# Patient Record
Sex: Female | Born: 1990
Health system: Southern US, Community
[De-identification: ages and names within clinical notes are randomized; demographics above are authoritative.]

## PROBLEM LIST (undated history)

## (undated) ENCOUNTER — Inpatient Hospital Stay (HOSPITAL_COMMUNITY): Payer: Self-pay

## (undated) DIAGNOSIS — A549 Gonococcal infection, unspecified: Secondary | ICD-10-CM

## (undated) DIAGNOSIS — B54 Unspecified malaria: Secondary | ICD-10-CM

## (undated) DIAGNOSIS — A749 Chlamydial infection, unspecified: Secondary | ICD-10-CM

## (undated) DIAGNOSIS — D332 Benign neoplasm of brain, unspecified: Secondary | ICD-10-CM

## (undated) HISTORY — PX: NO PAST SURGERIES: SHX2092

---
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
## 2011-09-27 DIAGNOSIS — D132 Benign neoplasm of duodenum: Secondary | ICD-10-CM | POA: Insufficient documentation

## 2011-09-27 HISTORY — DX: Benign neoplasm of duodenum: D13.2

## 2012-02-20 DIAGNOSIS — D126 Benign neoplasm of colon, unspecified: Secondary | ICD-10-CM | POA: Insufficient documentation

## 2012-02-20 HISTORY — DX: Benign neoplasm of colon, unspecified: D12.6

## 2012-05-13 ENCOUNTER — Emergency Department (HOSPITAL_COMMUNITY)
Admission: EM | Admit: 2012-05-13 | Discharge: 2012-05-13 | Disposition: A | Discharge status: Home Health | Payer: Medicaid Other | Attending: Emergency Medicine | Admitting: Emergency Medicine

## 2012-05-13 ENCOUNTER — Encounter (HOSPITAL_COMMUNITY): Payer: Self-pay | Admitting: *Deleted

## 2012-05-13 ENCOUNTER — Emergency Department (HOSPITAL_COMMUNITY): Payer: Medicaid Other

## 2012-05-13 DIAGNOSIS — N760 Acute vaginitis: Secondary | ICD-10-CM | POA: Insufficient documentation

## 2012-05-13 DIAGNOSIS — B9689 Other specified bacterial agents as the cause of diseases classified elsewhere: Secondary | ICD-10-CM | POA: Insufficient documentation

## 2012-05-13 DIAGNOSIS — M549 Dorsalgia, unspecified: Secondary | ICD-10-CM | POA: Insufficient documentation

## 2012-05-13 DIAGNOSIS — A499 Bacterial infection, unspecified: Secondary | ICD-10-CM | POA: Insufficient documentation

## 2012-05-13 DIAGNOSIS — O2390 Unspecified genitourinary tract infection in pregnancy, unspecified trimester: Secondary | ICD-10-CM | POA: Insufficient documentation

## 2012-05-13 DIAGNOSIS — Z3490 Encounter for supervision of normal pregnancy, unspecified, unspecified trimester: Secondary | ICD-10-CM

## 2012-05-13 LAB — URINALYSIS, ROUTINE W REFLEX MICROSCOPIC
Ketones, ur: NEGATIVE mg/dL
Leukocytes, UA: NEGATIVE
Nitrite: NEGATIVE
Protein, ur: NEGATIVE mg/dL
pH: 7.5 (ref 5.0–8.0)

## 2012-05-13 MED ORDER — CEFTRIAXONE SODIUM 250 MG IJ SOLR
250.0000 mg | Freq: Once | INTRAMUSCULAR | Status: AC
  Administered 2012-05-13: 250 mg via INTRAMUSCULAR
  Filled 2012-05-13: qty 250

## 2012-05-13 MED ORDER — METRONIDAZOLE 500 MG PO TABS: 500.0000 mg | ORAL_TABLET | Freq: Two times a day (BID) | ORAL | Status: AC

## 2012-05-13 MED ORDER — FOLIC ACID 1 MG PO TABS: 1.0000 mg | ORAL_TABLET | Freq: Every day | ORAL | Status: DC

## 2012-05-13 MED ORDER — THERA VITAL M PO TABS: 1.0000 | ORAL_TABLET | Freq: Every day | ORAL | Status: DC

## 2012-05-13 MED ORDER — AZITHROMYCIN 1 G PO PACK
1.0000 g | PACK | Freq: Once | ORAL | Status: AC
  Administered 2012-05-13: 1 g via ORAL
  Filled 2012-05-13: qty 1

## 2012-05-13 NOTE — ED Notes (Signed)
To ED for eval of lower back pain and lower abd pain for the past 2 wks. States she is 2 wks overdue for her menses.

## 2012-05-13 NOTE — ED Provider Notes (Signed)
History     CSN: [id number]  Arrival date & time 05/13/12  1354   First MD Initiated Contact with Patient 05/13/12 1646      Chief Complaint  Patient presents with  . Back Pain  . Abdominal Pain    (Consider location/radiation/quality/duration/timing/severity/associated sxs/prior treatment) HPI Comments: 21 y/o g0p0 comes in with cc of abd pain and back pain. Pt has 1 week of suprapubic abd pain, intermitent with no precipitating, aggravating or relieving factors. Pt has associated clear vaginal discharge, with no hx of STD in the past. Pt has no associated n/v/f/c/uti like sx. Pt does have lower back pain with the abd pain, but no trauma.   Patient is a 21 y.o. female presenting with back pain and abdominal pain. The history is provided by the patient.  Back Pain  Associated symptoms include abdominal pain. Pertinent negatives include no chest pain, no headaches and no dysuria.  Abdominal Pain The primary symptoms of the illness include abdominal pain. The primary symptoms of the illness do not include shortness of breath, nausea, vomiting or dysuria.  Additional symptoms associated with the illness include back pain.    History reviewed. No pertinent past medical history.  History reviewed. No pertinent past surgical history.  History reviewed. No pertinent family history.  History  Substance Use Topics  . Smoking status: Never Smoker   . Smokeless tobacco: Not on file  . Alcohol Use: Yes    OB History    Grav Para Term Preterm Abortions TAB SAB Ect Mult Living                  Review of Systems  Constitutional: Positive for activity change.  HENT: Negative for neck pain.   Respiratory: Negative for shortness of breath.   Cardiovascular: Negative for chest pain.  Gastrointestinal: Positive for abdominal pain. Negative for nausea and vomiting.  Genitourinary: Negative for dysuria.  Musculoskeletal: Positive for back pain.  Neurological: Negative for headaches.      Allergies  Review of patient's allergies indicates no known allergies.  Home Medications   Current Outpatient Rx  Name Route Sig Dispense Refill  . CABERGOLINE 0.5 MG PO TABS Oral Take 0.25 mg by mouth 2 (two) times a week.      BP 124/67  Pulse 75  Temp 98.6 F (37 C) (Oral)  Resp 16  SpO2 100%  LMP 03/30/2012  Physical Exam  Constitutional: She is oriented to person, place, and time. She appears well-developed and well-nourished.  HENT:  Head: Normocephalic and atraumatic.  Eyes: Conjunctivae and EOM are normal. Pupils are equal, round, and reactive to light.  Neck: Normal range of motion. Neck supple.  Cardiovascular: Normal rate, regular rhythm, normal heart sounds and intact distal pulses.   No murmur heard. Pulmonary/Chest: Effort normal. No respiratory distress. She has no wheezes.  Abdominal: Soft. Bowel sounds are normal. She exhibits no distension. There is no tenderness. There is no rebound and no guarding.  Genitourinary: Vagina normal and uterus normal.       External exam - normal, no lesions Speculum exam: Pt has some white discharge, no blood Bimanual exam: Patient has no CMT, no adnexal tenderness or fullness and cervical os is closed  Neurological: She is alert and oriented to person, place, and time.  Skin: Skin is warm and dry.    ED Course  Procedures (including critical care time)  Labs Reviewed  POCT PREGNANCY, URINE - Abnormal; Notable for the following:    Preg  Test, Ur POSITIVE (*)     All other components within normal limits  URINALYSIS, ROUTINE W REFLEX MICROSCOPIC  HCG, QUANTITATIVE, PREGNANCY  WET PREP, GENITAL   No results found.   No diagnosis found.    MDM  g0p0 patient comes in with cc of suprapubic pain and back pain, intermittent x 1 week. Abd exam is non peritoneal Upreg is +. We will get a pelvic exam and evaluate for STD as she has vaginal discharge.        Derwood Kaplan, MD 05/14/12 [id number]

## 2012-05-14 LAB — GC/CHLAMYDIA PROBE AMP, GENITAL
Chlamydia, DNA Probe: NEGATIVE
GC Probe Amp, Genital: NEGATIVE

## 2012-07-21 ENCOUNTER — Encounter (HOSPITAL_COMMUNITY): Payer: Self-pay | Admitting: *Deleted

## 2012-07-21 ENCOUNTER — Inpatient Hospital Stay (HOSPITAL_COMMUNITY)
Admission: AD | Admit: 2012-07-21 | Discharge: 2012-07-21 | Disposition: A | Discharge status: Home / Self Care | Payer: Medicaid Other | Source: Ambulatory Visit | Attending: Obstetrics & Gynecology | Admitting: Obstetrics & Gynecology

## 2012-07-21 DIAGNOSIS — Z3490 Encounter for supervision of normal pregnancy, unspecified, unspecified trimester: Secondary | ICD-10-CM

## 2012-07-21 DIAGNOSIS — O0930 Supervision of pregnancy with insufficient antenatal care, unspecified trimester: Secondary | ICD-10-CM

## 2012-07-21 DIAGNOSIS — O99891 Other specified diseases and conditions complicating pregnancy: Secondary | ICD-10-CM | POA: Insufficient documentation

## 2012-07-21 DIAGNOSIS — Z3480 Encounter for supervision of other normal pregnancy, unspecified trimester: Secondary | ICD-10-CM

## 2012-07-21 LAB — DIFFERENTIAL
Basophils Absolute: 0 10*3/uL (ref 0.0–0.1)
Basophils Relative: 0 % (ref 0–1)
Monocytes Absolute: 0.5 10*3/uL (ref 0.1–1.0)
Neutro Abs: 4.5 10*3/uL (ref 1.7–7.7)
Neutrophils Relative %: 71 % (ref 43–77)

## 2012-07-21 LAB — CBC
MCHC: 35 g/dL (ref 30.0–36.0)
RDW: 12.1 % (ref 11.5–15.5)

## 2012-07-21 LAB — RPR: RPR Ser Ql: NONREACTIVE

## 2012-07-21 NOTE — MAU Note (Signed)
Unsure of period dates and [place]; needs verification letter for medicaid insurance; FHR dopplered at [place] @ [facility]; pt had originally thought she was close to [redacted] weeks gestation;

## 2012-07-21 NOTE — MAU Note (Signed)
I'm pregnant, hasn't had any care.  Had preg confirmed at [facility] in July. Waiting on medicaid.  No pain, no bleeding.

## 2012-07-21 NOTE — MAU Note (Signed)
[place] end of July at [facility]- [redacted]w[redacted]d, date adjusted.

## 2012-07-21 NOTE — MAU Provider Note (Signed)
History     CSN: [id number]  Arrival date and time: 07/21/12 1757   None     No chief complaint on file.  HPI Pt is a 21 y.o. G1P0 here for prenatal visit. She found out she was pregnant in July after missed period. Positive pregnancy test at [facility] ED. Sono showed gestational sac and early pregnancy but not fetal pole. She has not been able to get Medicaid and thus has delayed her prenatal care. Her LMP was around 6/18, giving her an EDD or 01/11/13 which roughly agrees with the 5 week sono done at [facility]. She states her periods were regular. She was not on birth control but this is not a planned pregnancy.  She denies bleeding, cramping, headache, vision changes, dysuria, vaginal discharge.   OB History    Grav Para Term Preterm Abortions TAB SAB Ect Mult Living   1               Past Medical History  Diagnosis Date  . No pertinent past medical history     Past Surgical History  Procedure Date  . No past surgeries     Family History  Problem Relation Age of Onset  . Other Neg Hx   PGF diabetes, Maternal aunt (colon 31s)  History  Substance Use Topics  . Smoking status: Never Smoker   . Smokeless tobacco: Not on file  . Alcohol Use: No    Allergies: No Known Allergies  Prescriptions prior to admission  Medication Sig Dispense Refill  . Prenatal Vit-Fe Fumarate-FA (PRENATAL MULTIVITAMIN) TABS Take 1 tablet by mouth every morning.        Review of Systems  Constitutional: Negative for fever and chills.  Eyes: Negative for blurred vision and double vision.  Respiratory: Negative for shortness of breath.   Cardiovascular: Negative for chest pain.  Gastrointestinal: Negative for nausea, vomiting and abdominal pain.  Genitourinary: Negative for dysuria, urgency and frequency.  Neurological: Positive for headaches. Negative for dizziness.   Physical Exam   Blood pressure 133/86, pulse 91, temperature 98.2 F (36.8 C), temperature source Oral, resp. rate 16, height  5\' 7"  (1.702 m), weight 119.75 kg (264 lb), last menstrual period 03/30/2012.  Physical Exam  Constitutional: She is oriented to person, place, and time. She appears well-developed and well-nourished. No distress.  HENT:  Head: Normocephalic and atraumatic.  Eyes: Conjunctivae normal and EOM are normal.  Neck: Normal range of motion. Neck supple.  Cardiovascular: Normal rate, regular rhythm and normal heart sounds.   Respiratory: Effort normal and breath sounds normal. No respiratory distress.  GI: Soft. There is no tenderness. There is no rebound and no guarding.       Gravid - fundus below umbilicus.  Musculoskeletal: Normal range of motion. She exhibits no edema and no tenderness.  Neurological: She is alert and oriented to person, place, and time.  Skin: Skin is warm and dry.  Psychiatric: She has a normal mood and affect.    MAU Course  Procedures  Bedside sono shows fetus with positive heart tones.  Results for orders placed during the hospital encounter of 07/21/12 (from the past 24 hour(s))  HEPATITIS B SURFACE ANTIGEN     Status: Normal   Collection Time   07/21/12  7:45 PM      Component Value Range   Hepatitis B Surface Ag NEGATIVE  NEGATIVE  RUBELLA SCREEN     Status: Abnormal   Collection Time   07/21/12  7:45 PM  Component Value Range   Rubella 74.3 (*)   RPR     Status: Normal   Collection Time   07/21/12  7:45 PM      Component Value Range   RPR NON REACTIVE  NON REACTIVE  CBC     Status: Abnormal   Collection Time   07/21/12  7:45 PM      Component Value Range   WBC 6.4  4.0 - 10.5 K/uL   RBC 4.01  3.87 - 5.11 MIL/uL   Hemoglobin 11.7 (*) 12.0 - 15.0 g/dL   HCT 40.9 (*) 81.1 - 91.4 %   MCV 83.3  78.0 - 100.0 fL   MCH 29.2  26.0 - 34.0 pg   MCHC 35.0  30.0 - 36.0 g/dL   RDW 78.2  95.6 - 21.3 %   Platelets 199  150 - 400 K/uL  DIFFERENTIAL     Status: Normal   Collection Time   07/21/12  7:45 PM      Component Value Range   Neutrophils Relative 71   43 - 77 %   Neutro Abs 4.5  1.7 - 7.7 K/uL   Lymphocytes Relative 21  12 - 46 %   Lymphs Abs 1.4  0.7 - 4.0 K/uL   Monocytes Relative 7  3 - 12 %   Monocytes Absolute 0.5  0.1 - 1.0 K/uL   Eosinophils Relative 1  0 - 5 %   Eosinophils Absolute 0.0  0.0 - 0.7 K/uL   Basophils Relative 0  0 - 1 %   Basophils Absolute 0.0  0.0 - 0.1 K/uL  ABO/RH     Status: Normal   Collection Time   07/21/12  7:45 PM      Component Value Range   ABO/RH(D) A POS    HIV ANTIBODY (ROUTINE TESTING)     Status: Normal   Collection Time   07/21/12  7:45 PM      Component Value Range   HIV NON REACTIVE  NON REACTIVE     Assessment and Plan  21 y.o. G1P0 at [redacted]w[redacted]d with no prenatal care. 1.  Prenatal labs drawn.  GC/Chlamydia done at [facility] in July negative. 2.  Outpatient sono ordered for 18 weeks for fetal survey. 3.  Pt referred for care at [facility] low risk clinic.   Napoleon Form 07/21/2012, 7:27 PM

## 2012-07-22 ENCOUNTER — Encounter: Payer: Self-pay | Admitting: Family Medicine

## 2012-07-22 LAB — RUBELLA SCREEN: Rubella: 74.3 IU/mL — ABNORMAL HIGH

## 2012-07-22 LAB — HIV ANTIBODY (ROUTINE TESTING W REFLEX): HIV: NONREACTIVE

## 2012-08-12 ENCOUNTER — Ambulatory Visit (INDEPENDENT_AMBULATORY_CARE_PROVIDER_SITE_OTHER): Payer: Medicaid Other | Admitting: Family Medicine

## 2012-08-12 VITALS — BP 119/71 | Temp 97.7°F | Wt 257.0 lb

## 2012-08-12 DIAGNOSIS — O0930 Supervision of pregnancy with insufficient antenatal care, unspecified trimester: Secondary | ICD-10-CM

## 2012-08-12 DIAGNOSIS — Z3400 Encounter for supervision of normal first pregnancy, unspecified trimester: Secondary | ICD-10-CM

## 2012-08-12 DIAGNOSIS — Z23 Encounter for immunization: Secondary | ICD-10-CM

## 2012-08-12 LAB — POCT URINALYSIS DIP (DEVICE)
Glucose, UA: NEGATIVE mg/dL
Hgb urine dipstick: NEGATIVE
Nitrite: NEGATIVE
Urobilinogen, UA: 0.2 mg/dL (ref 0.0–1.0)

## 2012-08-12 MED ORDER — TETANUS-DIPHTH-ACELL PERTUSSIS 5-2.5-18.5 LF-MCG/0.5 IM SUSP: 0.5000 mL | Freq: Once | INTRAMUSCULAR | Status: DC

## 2012-08-12 NOTE — Progress Notes (Signed)
Subjective:    Adaliz Leggitt is being seen today for her first obstetrical visit.  This is not a planned pregnancy. She is at [redacted]w[redacted]d gestation by 5 week ultrasound, unsure of LMP. She has not had previous prenatal care due to being unable to get Medicaid in time. Her obstetrical history is significant for obesity. Relationship with FOB: spouse, not living together. Patient does intend to breast feed. Pregnancy history fully reviewed. No bleeding, cramping. Feeling flutters. Had Pap smear 1 year ago - health dept [place]. Negative.   The patient states she has a history of some kind of tumor in her head. She thinks it might be a pituitary adenoma. She saw an endocrinologist at [facility] and was on medication for a while. She thinks the tumor was secreting hormones because they checked her hormone levels. She has not followed up in a year and stopped the medication around that time. Currently, she is having frequent severe headaches, about 4-5 times a week, lasting at most an hour without medication. They do not wake her up. They are severe enough that she has to lie down. Massaging her head helps. No effect on vision and no nausea/vomiting or neurological symptoms with them. No history of headache or migraine. No early AM headache.   Menstrual History: OB History    Grav Para Term Preterm Abortions TAB SAB Ect Mult Living   1              Patient's last menstrual period was 03/30/2012.    The following portions of the patient's history were reviewed and updated as appropriate: allergies, current medications, past family history, past medical history, past social history, past surgical history and problem list.  Review of Systems Constitutional: negative for anorexia, chills, fatigue and fevers Eyes: negative for visual disturbance Respiratory: negative for asthma, cough and wheezing Cardiovascular: negative for chest pain, dyspnea and palpitations Gastrointestinal: negative for abdominal  pain, constipation, diarrhea and vomiting Genitourinary:negative for dysuria and vaginal discharge Neurological: negative for dizziness, tremors and weakness    Objective:    General appearance: alert, cooperative and no distress Head: Normocephalic, without obvious abnormality, atraumatic Eyes: Conjunctiva clear, EOMI Neck: no adenopathy, supple, symmetrical, trachea midline and thyroid not enlarged, symmetric, no tenderness/mass/nodules Lungs: clear to auscultation bilaterally Heart: regular rate and rhythm, S1, S2 normal, no murmur, click, rub or gallop Abdomen: Normal bowel sounds, non-tender, gravid. Pelvic: cervix normal in appearance, external genitalia normal, no cervical motion tenderness, vagina normal without discharge and adnexal exam limited by body habitus Extremities: extremities normal, atraumatic, no cyanosis or edema Skin: Skin color, texture, turgor normal. No rashes or lesions Neurologic: Grossly normal    Assessment:    Pregnancy at 18 and 2/7 weeks    Plan:    Initial labs drawn in MAU. GC/Chlamydia done today. No pap as pt had pap last year - will get records. Prenatal vitamins. Problem list reviewed and updated. Pituitary adenoma - will get records from endocrinology.  AFP3 discussed: requested. Role of ultrasound in pregnancy discussed; fetal survey: requested. Amniocentesis discussed: not indicated. Get endocrinology records for possible pituitary adenoma Headaches:  Tylenol, no alarm signs for tumor (vision normal, no early morning/waking headaches). Get records as above. Follow up in 2 weeks. 50% of 45 min visit spent on counseling and coordination of care.

## 2012-08-12 NOTE — Progress Notes (Signed)
Pulse 90 C/o left plantar lesion.

## 2012-08-12 NOTE — Patient Instructions (Signed)
Pregnancy - Second Trimester The second trimester of pregnancy (3 to 6 months) is a period of rapid growth for you and your baby. At the end of the sixth month, your baby is about 9 inches long and weighs 1 1/2 pounds. You will begin to feel the baby move between 18 and 20 weeks of the pregnancy. This is called quickening. Weight gain is faster. A clear fluid (colostrum) may leak out of your breasts. You may feel small contractions of the womb (uterus). This is known as false labor or Braxton-Hicks contractions. This is like a practice for labor when the baby is ready to be born. Usually, the problems with morning sickness have usually passed by the end of your first trimester. Some women develop small dark blotches (called cholasma, mask of pregnancy) on their face that usually goes away after the baby is born. Exposure to the sun makes the blotches worse. Acne may also develop in some pregnant women and pregnant women who have acne, may find that it goes away. PRENATAL EXAMS  Blood work may continue to be done during prenatal exams. These tests are done to check on your health and the probable health of your baby. Blood work is used to follow your blood levels (hemoglobin). Anemia (low hemoglobin) is common during pregnancy. Iron and vitamins are given to help prevent this. You will also be checked for diabetes between 24 and 28 weeks of the pregnancy. Some of the previous blood tests may be repeated.  The size of the uterus is measured during each visit. This is to make sure that the baby is continuing to grow properly according to the dates of the pregnancy.  Your blood pressure is checked every prenatal visit. This is to make sure you are not getting toxemia.  Your urine is checked to make sure you do not have an infection, diabetes or protein in the urine.  Your weight is checked often to make sure gains are happening at the suggested rate. This is to ensure that both you and your baby are growing  normally.  Sometimes, an ultrasound is performed to confirm the proper growth and development of the baby. This is a test which bounces harmless sound waves off the baby so your caregiver can more accurately determine due dates. Sometimes, a specialized test is done on the amniotic fluid surrounding the baby. This test is called an amniocentesis. The amniotic fluid is obtained by sticking a needle into the belly (abdomen). This is done to check the chromosomes in instances where there is a concern about possible genetic problems with the baby. It is also sometimes done near the end of pregnancy if an early delivery is required. In this case, it is done to help make sure the baby's lungs are mature enough for the baby to live outside of the womb. CHANGES OCCURING IN THE SECOND TRIMESTER OF PREGNANCY Your body goes through many changes during pregnancy. They vary from person to person. Talk to your caregiver about changes you notice that you are concerned about.  During the second trimester, you will likely have an increase in your appetite. It is normal to have cravings for certain foods. This varies from person to person and pregnancy to pregnancy.  Your lower abdomen will begin to bulge.  You may have to urinate more often because the uterus and baby are pressing on your bladder. It is also common to get more bladder infections during pregnancy (pain with urination). You can help this by   drinking lots of fluids and emptying your bladder before and after intercourse.  You may begin to get stretch marks on your hips, abdomen, and breasts. These are normal changes in the body during pregnancy. There are no exercises or medications to take that prevent this change.  You may begin to develop swollen and bulging veins (varicose veins) in your legs. Wearing support hose, elevating your feet for 15 minutes, 3 to 4 times a day and limiting salt in your diet helps lessen the problem.  Heartburn may develop  as the uterus grows and pushes up against the stomach. Antacids recommended by your caregiver helps with this problem. Also, eating smaller meals 4 to 5 times a day helps.  Constipation can be treated with a stool softener or adding bulk to your diet. Drinking lots of fluids, vegetables, fruits, and whole grains are helpful.  Exercising is also helpful. If you have been very active up until your pregnancy, most of these activities can be continued during your pregnancy. If you have been less active, it is helpful to start an exercise program such as walking.  Hemorrhoids (varicose veins in the rectum) may develop at the end of the second trimester. Warm sitz baths and hemorrhoid cream recommended by your caregiver helps hemorrhoid problems.  Backaches may develop during this time of your pregnancy. Avoid heavy lifting, wear low heal shoes and practice good posture to help with backache problems.  Some pregnant women develop tingling and numbness of their hand and fingers because of swelling and tightening of ligaments in the wrist (carpel tunnel syndrome). This goes away after the baby is born.  As your breasts enlarge, you may have to get a bigger bra. Get a comfortable, cotton, support bra. Do not get a nursing bra until the last month of the pregnancy if you will be nursing the baby.  You may get a dark line from your belly button to the pubic area called the linea nigra.  You may develop rosy cheeks because of increase blood flow to the face.  You may develop spider looking lines of the face, neck, arms and chest. These go away after the baby is born. HOME CARE INSTRUCTIONS   It is extremely important to avoid all smoking, herbs, alcohol, and unprescribed drugs during your pregnancy. These chemicals affect the formation and growth of the baby. Avoid these chemicals throughout the pregnancy to ensure the delivery of a healthy infant.  Most of your home care instructions are the same as  suggested for the first trimester of your pregnancy. Keep your caregiver's appointments. Follow your caregiver's instructions regarding medication use, exercise and diet.  During pregnancy, you are providing food for you and your baby. Continue to eat regular, well-balanced meals. Choose foods such as meat, fish, milk and other low fat dairy products, vegetables, fruits, and whole-grain breads and cereals. Your caregiver will tell you of the ideal weight gain.  A physical sexual relationship may be continued up until near the end of pregnancy if there are no other problems. Problems could include early (premature) leaking of amniotic fluid from the membranes, vaginal bleeding, abdominal pain, or other medical or pregnancy problems.  Exercise regularly if there are no restrictions. Check with your caregiver if you are unsure of the safety of some of your exercises. The greatest weight gain will occur in the last 2 trimesters of pregnancy. Exercise will help you:  Control your weight.  Get you in shape for labor and delivery.  Lose weight   after you have the baby.  Wear a good support or jogging bra for breast tenderness during pregnancy. This may help if worn during sleep. Pads or tissues may be used in the bra if you are leaking colostrum.  Do not use hot tubs, steam rooms or saunas throughout the pregnancy.  Wear your seat belt at all times when driving. This protects you and your baby if you are in an accident.  Avoid raw meat, uncooked cheese, cat litter boxes and soil used by cats. These carry germs that can cause birth defects in the baby.  The second trimester is also a good time to visit your dentist for your dental health if this has not been done yet. Getting your teeth cleaned is OK. Use a soft toothbrush. Brush gently during pregnancy.  It is easier to loose urine during pregnancy. Tightening up and strengthening the pelvic muscles will help with this problem. Practice stopping your  urination while you are going to the bathroom. These are the same muscles you need to strengthen. It is also the muscles you would use as if you were trying to stop from passing gas. You can practice tightening these muscles up 10 times a set and repeating this about 3 times per day. Once you know what muscles to tighten up, do not perform these exercises during urination. It is more likely to contribute to an infection by backing up the urine.  Ask for help if you have financial, counseling or nutritional needs during pregnancy. Your caregiver will be able to offer counseling for these needs as well as refer you for other special needs.  Your skin may become oily. If so, wash your face with mild soap, use non-greasy moisturizer and oil or cream based makeup. MEDICATIONS AND DRUG USE IN PREGNANCY  Take prenatal vitamins as directed. The vitamin should contain 1 milligram of folic acid. Keep all vitamins out of reach of children. Only a couple vitamins or tablets containing iron may be fatal to a baby or young child when ingested.  Avoid use of all medications, including herbs, over-the-counter medications, not prescribed or suggested by your caregiver. Only take over-the-counter or prescription medicines for pain, discomfort, or fever as directed by your caregiver. Do not use aspirin.  Let your caregiver also know about herbs you may be using.  Alcohol is related to a number of birth defects. This includes fetal alcohol syndrome. All alcohol, in any form, should be avoided completely. Smoking will cause low birth rate and premature babies.  Street or illegal drugs are very harmful to the baby. They are absolutely forbidden. A baby born to an addicted mother will be addicted at birth. The baby will go through the same withdrawal an adult does. SEEK MEDICAL CARE IF:  You have any concerns or worries during your pregnancy. It is better to call with your questions if you feel they cannot wait, rather  than worry about them. SEEK IMMEDIATE MEDICAL CARE IF:   An unexplained oral temperature above 102 F (38.9 C) develops, or as your caregiver suggests.  You have leaking of fluid from the vagina (birth canal). If leaking membranes are suspected, take your temperature and tell your caregiver of this when you call.  There is vaginal spotting, bleeding, or passing clots. Tell your caregiver of the amount and how many pads are used. Light spotting in pregnancy is common, especially following intercourse.  You develop a bad smelling vaginal discharge with a change in the color from clear   to white.  You continue to feel sick to your stomach (nauseated) and have no relief from remedies suggested. You vomit blood or coffee ground-like materials.  You lose more than 2 pounds of weight or gain more than 2 pounds of weight over 1 week, or as suggested by your caregiver.  You notice swelling of your face, hands, feet, or legs.  You get exposed to German measles and have never had them.  You are exposed to fifth disease or chickenpox.  You develop belly (abdominal) pain. Round ligament discomfort is a common non-cancerous (benign) cause of abdominal pain in pregnancy. Your caregiver still must evaluate you.  You develop a bad headache that does not go away.  You develop fever, diarrhea, pain with urination, or shortness of breath.  You develop visual problems, blurry, or double vision.  You fall or are in a car accident or any kind of trauma.  There is mental or physical violence at home. Document Released: 09/24/2001 Document Revised: 12/23/2011 Document Reviewed: 03/29/2009 Exit[employer] Patient Information 2013 [employer].  

## 2012-08-13 ENCOUNTER — Encounter: Payer: Self-pay | Admitting: Family Medicine

## 2012-08-13 LAB — WET PREP, GENITAL
Trich, Wet Prep: NONE SEEN
Yeast Wet Prep HPF POC: NONE SEEN

## 2012-08-13 LAB — GC/CHLAMYDIA PROBE AMP, GENITAL: Chlamydia, DNA Probe: NEGATIVE

## 2012-08-17 ENCOUNTER — Ambulatory Visit (HOSPITAL_COMMUNITY)
Admission: RE | Admit: 2012-08-17 | Discharge: 2012-08-17 | Disposition: A | Discharge status: Home / Self Care | Payer: Medicaid Other | Source: Ambulatory Visit | Attending: Family Medicine | Admitting: Family Medicine

## 2012-08-17 DIAGNOSIS — Z3490 Encounter for supervision of normal pregnancy, unspecified, unspecified trimester: Secondary | ICD-10-CM

## 2012-08-17 DIAGNOSIS — O358XX0 Maternal care for other (suspected) fetal abnormality and damage, not applicable or unspecified: Secondary | ICD-10-CM | POA: Insufficient documentation

## 2012-08-17 DIAGNOSIS — Z1389 Encounter for screening for other disorder: Secondary | ICD-10-CM | POA: Insufficient documentation

## 2012-08-17 DIAGNOSIS — E669 Obesity, unspecified: Secondary | ICD-10-CM | POA: Insufficient documentation

## 2012-08-17 DIAGNOSIS — Z363 Encounter for antenatal screening for malformations: Secondary | ICD-10-CM | POA: Insufficient documentation

## 2012-08-26 ENCOUNTER — Encounter: Payer: Medicaid Other | Admitting: Advanced Practice Midwife

## 2012-09-02 ENCOUNTER — Encounter: Payer: Self-pay | Admitting: Advanced Practice Midwife

## 2012-09-09 ENCOUNTER — Ambulatory Visit (INDEPENDENT_AMBULATORY_CARE_PROVIDER_SITE_OTHER): Payer: Medicaid Other | Admitting: Obstetrics & Gynecology

## 2012-09-09 VITALS — BP 121/74 | Temp 97.5°F | Wt 261.0 lb

## 2012-09-09 DIAGNOSIS — Z0489 Encounter for examination and observation for other specified reasons: Secondary | ICD-10-CM

## 2012-09-09 DIAGNOSIS — Z3480 Encounter for supervision of other normal pregnancy, unspecified trimester: Secondary | ICD-10-CM | POA: Insufficient documentation

## 2012-09-09 DIAGNOSIS — Z3490 Encounter for supervision of normal pregnancy, unspecified, unspecified trimester: Secondary | ICD-10-CM

## 2012-09-09 LAB — POCT URINALYSIS DIP (DEVICE)
Hgb urine dipstick: NEGATIVE
Protein, ur: NEGATIVE mg/dL
Specific Gravity, Urine: 1.02 (ref 1.005–1.030)
Urobilinogen, UA: 0.2 mg/dL (ref 0.0–1.0)
pH: 7 (ref 5.0–8.0)

## 2012-09-09 NOTE — Progress Notes (Signed)
No other complaints or concerns.  Fetal movement and preterm labor precautions reviewed.

## 2012-09-09 NOTE — Patient Instructions (Signed)
Contraception Choices  Contraception (birth control) is the use of any methods or devices to prevent pregnancy. Below are some methods to help avoid pregnancy.  HORMONAL METHODS   · Contraceptive implant. This is a thin, plastic tube containing progesterone hormone. It does not contain estrogen hormone. Your caregiver inserts the tube in the inner part of the upper arm. The tube can remain in place for up to 3 years. After 3 years, the implant must be removed. The implant prevents the ovaries from releasing an egg (ovulation), thickens the cervical mucus which prevents sperm from entering the uterus, and thins the lining of the inside of the uterus.  · Progesterone-only injections. These injections are given every 3 months by your caregiver to prevent pregnancy. This synthetic progesterone hormone stops the ovaries from releasing eggs. It also thickens cervical mucus and changes the uterine lining. This makes it harder for sperm to survive in the uterus.  · Birth control pills. These pills contain estrogen and progesterone hormone. They work by stopping the egg from forming in the ovary (ovulation). Birth control pills are prescribed by a caregiver. Birth control pills can also be used to treat heavy periods.  · Minipill. This type of birth control pill contains only the progesterone hormone. They are taken every day of each month and must be prescribed by your caregiver.  · Birth control patch. The patch contains hormones similar to those in birth control pills. It must be changed once a week and is prescribed by a caregiver.  · Vaginal ring. The ring contains hormones similar to those in birth control pills. It is left in the vagina for 3 weeks, removed for 1 week, and then a new one is put back in place. The patient must be comfortable inserting and removing the ring from the vagina. A caregiver's prescription is necessary.  · Emergency contraception. Emergency contraceptives prevent pregnancy after unprotected  sexual intercourse. This pill can be taken right after sex or up to 5 days after unprotected sex. It is most effective the sooner you take the pills after having sexual intercourse. Emergency contraceptive pills are available without a prescription. Check with your pharmacist. Do not use emergency contraception as your only form of birth control.  BARRIER METHODS   · Female condom. This is a thin sheath (latex or rubber) that is worn over the penis during sexual intercourse. It can be used with spermicide to increase effectiveness.  · Female condom. This is a soft, loose-fitting sheath that is put into the vagina before sexual intercourse.  · Diaphragm. This is a soft, latex, dome-shaped barrier that must be fitted by a caregiver. It is inserted into the vagina, along with a spermicidal jelly. It is inserted before intercourse. The diaphragm should be left in the vagina for 6 to 8 hours after intercourse.  · Cervical cap. This is a round, soft, latex or plastic cup that fits over the cervix and must be fitted by a caregiver. The cap can be left in place for up to 48 hours after intercourse.  · Sponge. This is a soft, circular piece of polyurethane foam. The sponge has spermicide in it. It is inserted into the vagina after wetting it and before sexual intercourse.  · Spermicides. These are chemicals that kill or block sperm from entering the cervix and uterus. They come in the form of creams, jellies, suppositories, foam, or tablets. They do not require a prescription. They are inserted into the vagina with an applicator before having sexual intercourse.   The process must be repeated every time you have sexual intercourse.  INTRAUTERINE CONTRACEPTION  · Intrauterine device (IUD). This is a T-shaped device that is put in a woman's uterus during a menstrual period to prevent pregnancy. There are 2 types:  · Copper IUD. This type of IUD is wrapped in copper wire and is placed inside the uterus. Copper makes the uterus and  fallopian tubes produce a fluid that kills sperm. It can stay in place for 10 years.  · Hormone IUD. This type of IUD contains the hormone progestin (synthetic progesterone). The hormone thickens the cervical mucus and prevents sperm from entering the uterus, and it also thins the uterine lining to prevent implantation of a fertilized egg. The hormone can weaken or kill the sperm that get into the uterus. It can stay in place for 5 years.  PERMANENT METHODS OF CONTRACEPTION  · Female tubal ligation. This is when the woman's fallopian tubes are surgically sealed, tied, or blocked to prevent the egg from traveling to the uterus.  · Female sterilization. This is when the female has the tubes that carry sperm tied off (vasectomy). This blocks sperm from entering the vagina during sexual intercourse. After the procedure, the man can still ejaculate fluid (semen).  NATURAL PLANNING METHODS  · Natural family planning. This is not having sexual intercourse or using a barrier method (condom, diaphragm, cervical cap) on days the woman could become pregnant.  · Calendar method. This is keeping track of the length of each menstrual cycle and identifying when you are fertile.  · Ovulation method. This is avoiding sexual intercourse during ovulation.  · Symptothermal method. This is avoiding sexual intercourse during ovulation, using a thermometer and ovulation symptoms.  · Post-ovulation method. This is timing sexual intercourse after you have ovulated.  Regardless of which type or method of contraception you choose, it is important that you use condoms to protect against the transmission of sexually transmitted diseases (STDs). Talk with your caregiver about which form of contraception is most appropriate for you.  Document Released: 09/30/2005 Document Revised: 12/23/2011 Document Reviewed: 02/06/2011  ExitCare® Patient Information ©2013 [employer].

## 2012-09-09 NOTE — Progress Notes (Signed)
Pulse: 86

## 2012-09-16 ENCOUNTER — Ambulatory Visit (HOSPITAL_COMMUNITY)
Admission: RE | Admit: 2012-09-16 | Discharge: 2012-09-16 | Disposition: A | Discharge status: Home / Self Care | Payer: Medicaid Other | Source: Ambulatory Visit | Attending: Obstetrics & Gynecology | Admitting: Obstetrics & Gynecology

## 2012-09-16 DIAGNOSIS — Z3689 Encounter for other specified antenatal screening: Secondary | ICD-10-CM | POA: Insufficient documentation

## 2012-09-16 DIAGNOSIS — E669 Obesity, unspecified: Secondary | ICD-10-CM | POA: Insufficient documentation

## 2012-09-16 DIAGNOSIS — Z0489 Encounter for examination and observation for other specified reasons: Secondary | ICD-10-CM

## 2012-09-30 ENCOUNTER — Encounter: Payer: Self-pay | Admitting: Obstetrics & Gynecology

## 2012-10-05 ENCOUNTER — Encounter: Payer: Self-pay | Admitting: Obstetrics & Gynecology

## 2012-10-05 ENCOUNTER — Inpatient Hospital Stay (HOSPITAL_COMMUNITY)
Admission: AD | Admit: 2012-10-05 | Discharge: 2012-10-06 | Disposition: A | Discharge status: Hospice | Payer: Medicaid Other | Source: Ambulatory Visit | Attending: Family Medicine | Admitting: Family Medicine

## 2012-10-05 ENCOUNTER — Encounter (HOSPITAL_COMMUNITY): Payer: Self-pay | Admitting: *Deleted

## 2012-10-05 DIAGNOSIS — N949 Unspecified condition associated with female genital organs and menstrual cycle: Secondary | ICD-10-CM | POA: Diagnosis present

## 2012-10-05 DIAGNOSIS — R109 Unspecified abdominal pain: Secondary | ICD-10-CM | POA: Insufficient documentation

## 2012-10-05 DIAGNOSIS — M549 Dorsalgia, unspecified: Secondary | ICD-10-CM | POA: Insufficient documentation

## 2012-10-05 DIAGNOSIS — O99891 Other specified diseases and conditions complicating pregnancy: Secondary | ICD-10-CM | POA: Diagnosis present

## 2012-10-05 HISTORY — DX: Benign neoplasm of brain, unspecified: D33.2

## 2012-10-05 HISTORY — DX: Chlamydial infection, unspecified: A74.9

## 2012-10-05 HISTORY — DX: Gonococcal infection, unspecified: A54.9

## 2012-10-05 LAB — URINALYSIS, ROUTINE W REFLEX MICROSCOPIC
Glucose, UA: NEGATIVE mg/dL
Hgb urine dipstick: NEGATIVE
Ketones, ur: 15 mg/dL — AB
Protein, ur: NEGATIVE mg/dL

## 2012-10-05 NOTE — MAU Note (Signed)
Pt G1 at 26wks having back pain and lower abd pain that started this evening while at work.  Denies bleeding.

## 2012-10-06 DIAGNOSIS — R109 Unspecified abdominal pain: Secondary | ICD-10-CM

## 2012-10-06 DIAGNOSIS — N949 Unspecified condition associated with female genital organs and menstrual cycle: Secondary | ICD-10-CM | POA: Diagnosis present

## 2012-10-06 DIAGNOSIS — M549 Dorsalgia, unspecified: Secondary | ICD-10-CM

## 2012-10-06 NOTE — MAU Provider Note (Signed)
Chief Complaint:  Back Pain and Abdominal Pain   First Provider Initiated Contact with Patient 10/06/12 0002      HPI: Maria Morgan is a 21 y.o. G1P0 at 37w1dwho presents to maternity admissions reporting an episode of back and lower abdominal pain while at work at [employer].  She reports arguing with her manager tonight, who wanted her to lift some heavy boxes.  The pain was intermittent for about 2 hours and has resolved now.  She reports good fetal movement, denies LOF, vaginal bleeding, vaginal itching/burning, urinary symptoms, h/a, dizziness, n/v, or fever/chills.     Past Medical History: Past Medical History  Diagnosis Date  . No pertinent past medical history   . Brain tumor (benign)   . Gonorrhea   . Chlamydia      Past Surgical History: Past Surgical History  Procedure Date  . No past surgeries     Family History: Family History  Problem Relation Age of Onset  . Other Neg Hx   . Diabetes Paternal Grandfather     Social History: History  Substance Use Topics  . Smoking status: Former Smoker    Types: Cigarettes    Quit date: 10/05/2010  . Smokeless tobacco: Not on file  . Alcohol Use: No    Allergies: No Known Allergies  Meds:  Prescriptions prior to admission  Medication Sig Dispense Refill  . Prenatal Vit-Fe Fumarate-FA (PRENATAL MULTIVITAMIN) TABS Take 1 tablet by mouth every morning.        ROS: Pertinent findings in history of present illness.  Physical Exam  Blood pressure 125/76, pulse 83, temperature 97.4 F (36.3 C), temperature source Oral, resp. rate 16, height 5\' 7"  (1.702 m), weight 120.203 kg (265 lb), last menstrual period 03/30/2012. GENERAL: Well-developed, well-nourished female in no acute distress.  HEENT: normocephalic HEART: normal rate RESP: normal effort ABDOMEN: Soft, non-tender, gravid appropriate for gestational age EXTREMITIES: Nontender, no edema NEURO: alert and oriented  Dilation: Closed Effacement (%):  Thick Cervical Position: Posterior Exam by:: Leftwich-Kirby CNM  FHT:  Baseline 150 , moderate variability, accelerations present (10x10), no decelerations Contractions: None on toco, none by palpation   Labs: Results for orders placed during the hospital encounter of 10/05/12 (from the past 24 hour(s))  URINALYSIS, ROUTINE W REFLEX MICROSCOPIC     Status: Abnormal   Collection Time   10/05/12 11:35 PM      Component Value Range   Color, Urine YELLOW  YELLOW   APPearance CLEAR  CLEAR   Specific Gravity, Urine >1.030 (*) 1.005 - 1.030   pH 5.5  5.0 - 8.0   Glucose, UA NEGATIVE  NEGATIVE mg/dL   Hgb urine dipstick NEGATIVE  NEGATIVE   Bilirubin Urine NEGATIVE  NEGATIVE   Ketones, ur 15 (*) NEGATIVE mg/dL   Protein, ur NEGATIVE  NEGATIVE mg/dL   Urobilinogen, UA 0.2  0.0 - 1.0 mg/dL   Nitrite NEGATIVE  NEGATIVE   Leukocytes, UA NEGATIVE  NEGATIVE     Assessment: 1. Round ligament pain   2. Back pain in pregnancy     Plan: Discharge home Drink plenty of water Recommend pregnancy support belt, good body mechanics, rest/heat/ice/Tylenol as needed for pain Letter for work to restrict lifting to less than 20 lbs at work F/U as scheduled at [facility] clinic Return to MAU as needed  Follow-up Information    Follow up with [facility].   Contact information:   [address] [place] [address] [phone]  Medication List     As of 10/06/2012 12:12 AM    TAKE these medications         prenatal multivitamin Tabs   Take 1 tablet by mouth every morning.        Sharen Counter Certified Nurse-Midwife 10/06/2012 12:12 AM

## 2012-10-06 NOTE — MAU Provider Note (Signed)
Chart reviewed and agree with management and plan.  

## 2012-10-14 NOTE — L&D Delivery Note (Signed)
Attestation of Attending Supervision of Advanced Practitioner (PA/CNM/NP): Evaluation and management procedures were performed by the Advanced Practitioner under my supervision and collaboration.  I have reviewed the Advanced Practitioner's note and chart, and I agree with the management and plan.  Ivanell Deshotel, MD, FACOG Attending Obstetrician & Gynecologist Faculty Practice, [facility]  

## 2012-10-14 NOTE — L&D Delivery Note (Signed)
Maria Morgan is a 22 y.o. G1P0 presenting at [redacted]w[redacted]d with spontaneous labor. She was augmented with pitocin and progressed to complete dilation.   Delivery Note At 1:01 PM a viable female was delivered via Vaginal, Spontaneous Delivery (Presentation: Right Occiput Anterior).  APGAR: 8, 9; weight pending.   Placenta status: Intact, Spontaneous.  Cord: 3 vessels. Complications: None.  Cord pH: n/a  Anesthesia: Epidural  Episiotomy: None Lacerations: 1st degree;Periurethral Suture Repair: 3.0 vicryl rapide Est. Blood Loss (mL):   Mom to postpartum.  Baby to nursery-stable.  Napoleon Form 01/09/2013, 1:29 PM

## 2012-10-21 ENCOUNTER — Ambulatory Visit (INDEPENDENT_AMBULATORY_CARE_PROVIDER_SITE_OTHER): Payer: Self-pay | Admitting: Family Medicine

## 2012-10-21 LAB — POCT URINALYSIS DIP (DEVICE)
Hgb urine dipstick: NEGATIVE
Protein, ur: NEGATIVE mg/dL
Specific Gravity, Urine: 1.02 (ref 1.005–1.030)
Urobilinogen, UA: 0.2 mg/dL (ref 0.0–1.0)
pH: 7 (ref 5.0–8.0)

## 2012-10-21 NOTE — Progress Notes (Signed)
Patient checked in but not in waiting area. Never seen by provider.

## 2012-10-21 NOTE — Progress Notes (Signed)
Pulse- 87 

## 2012-10-28 ENCOUNTER — Ambulatory Visit (INDEPENDENT_AMBULATORY_CARE_PROVIDER_SITE_OTHER): Payer: Medicaid Other | Admitting: Advanced Practice Midwife

## 2012-10-28 VITALS — BP 132/83 | Temp 97.1°F | Wt 264.0 lb

## 2012-10-28 DIAGNOSIS — Z3480 Encounter for supervision of other normal pregnancy, unspecified trimester: Secondary | ICD-10-CM

## 2012-10-28 DIAGNOSIS — Z3490 Encounter for supervision of normal pregnancy, unspecified, unspecified trimester: Secondary | ICD-10-CM

## 2012-10-28 LAB — CBC
MCH: 30.1 pg (ref 26.0–34.0)
MCHC: 34.5 g/dL (ref 30.0–36.0)
MCV: 87.2 fL (ref 78.0–100.0)
Platelets: 192 10*3/uL (ref 150–400)
RBC: 3.76 MIL/uL — ABNORMAL LOW (ref 3.87–5.11)

## 2012-10-28 LAB — POCT URINALYSIS DIP (DEVICE)
Bilirubin Urine: NEGATIVE
Hgb urine dipstick: NEGATIVE
Nitrite: NEGATIVE
Protein, ur: NEGATIVE mg/dL
Urobilinogen, UA: 0.2 mg/dL (ref 0.0–1.0)
pH: 6.5 (ref 5.0–8.0)

## 2012-10-28 NOTE — Progress Notes (Signed)
Pulse:  90 Vaginal d/c stated as thin clear mucuous; no itch, no odor.

## 2012-10-28 NOTE — Addendum Note (Signed)
Addended by: Franchot Mimes on: 10/28/2012 09:59 AM   Modules accepted: Orders

## 2012-11-11 ENCOUNTER — Encounter: Payer: Medicaid Other | Admitting: Family

## 2012-11-18 ENCOUNTER — Encounter: Payer: Medicaid Other | Admitting: Obstetrics and Gynecology

## 2012-11-25 ENCOUNTER — Ambulatory Visit (INDEPENDENT_AMBULATORY_CARE_PROVIDER_SITE_OTHER): Payer: Medicaid Other | Admitting: Advanced Practice Midwife

## 2012-11-25 VITALS — BP 121/80 | Temp 97.1°F | Wt 265.4 lb

## 2012-11-25 DIAGNOSIS — O9989 Other specified diseases and conditions complicating pregnancy, childbirth and the puerperium: Secondary | ICD-10-CM

## 2012-11-25 DIAGNOSIS — Z3490 Encounter for supervision of normal pregnancy, unspecified, unspecified trimester: Secondary | ICD-10-CM

## 2012-11-25 NOTE — Progress Notes (Signed)
No complaints. Feeling well. Nervous about labor. Encouraged her to take childbirth classes. PTL danger signs reviewed.

## 2012-12-09 ENCOUNTER — Encounter: Payer: Medicaid Other | Admitting: Family Medicine

## 2012-12-16 ENCOUNTER — Encounter: Payer: Medicaid Other | Admitting: Family

## 2012-12-30 ENCOUNTER — Ambulatory Visit (INDEPENDENT_AMBULATORY_CARE_PROVIDER_SITE_OTHER): Payer: Medicaid Other | Admitting: Advanced Practice Midwife

## 2012-12-30 VITALS — BP 125/80 | Temp 97.5°F | Wt 269.0 lb

## 2012-12-30 DIAGNOSIS — O9989 Other specified diseases and conditions complicating pregnancy, childbirth and the puerperium: Secondary | ICD-10-CM

## 2012-12-30 DIAGNOSIS — M549 Dorsalgia, unspecified: Secondary | ICD-10-CM

## 2012-12-30 LAB — POCT URINALYSIS DIP (DEVICE)
Bilirubin Urine: NEGATIVE
Hgb urine dipstick: NEGATIVE
Nitrite: NEGATIVE
pH: 7.5 (ref 5.0–8.0)

## 2012-12-30 NOTE — Progress Notes (Signed)
Pulse-  77 Pain/pressure-lower abd

## 2012-12-31 LAB — GC/CHLAMYDIA PROBE AMP: GC Probe RNA: NEGATIVE

## 2012-12-31 NOTE — Progress Notes (Signed)
GBS and cultures collected.

## 2013-01-04 ENCOUNTER — Encounter: Payer: Self-pay | Admitting: Advanced Practice Midwife

## 2013-01-06 ENCOUNTER — Telehealth: Payer: Self-pay

## 2013-01-06 ENCOUNTER — Encounter: Payer: Medicaid Other | Admitting: Advanced Practice Midwife

## 2013-01-06 NOTE — Telephone Encounter (Signed)
Pt called and stated that she came in last Wednesday and wanted to know her test results.  Called pt and left message to return the call to the clinics.

## 2013-01-06 NOTE — Telephone Encounter (Signed)
Pt returned call to the clinics.

## 2013-01-06 NOTE — Telephone Encounter (Signed)
Called Maria Morgan and left a voicemail we are returning your call, sorry we have missed you again- please call us and leave a message if it is ok for [place] to leave detailed information on your voicemail and what is best number to call. Per chart review had negative GBS,negativeGC/CHlam

## 2013-01-07 ENCOUNTER — Inpatient Hospital Stay (HOSPITAL_COMMUNITY)
Admission: AD | Admit: 2013-01-07 | Discharge: 2013-01-07 | Disposition: A | Discharge status: Home / Self Care | Payer: Medicaid Other | Source: Ambulatory Visit | Attending: Obstetrics and Gynecology | Admitting: Obstetrics and Gynecology

## 2013-01-07 ENCOUNTER — Encounter (HOSPITAL_COMMUNITY): Payer: Self-pay

## 2013-01-07 ENCOUNTER — Inpatient Hospital Stay (HOSPITAL_COMMUNITY)
Admission: AD | Admit: 2013-01-07 | Discharge: 2013-01-08 | Disposition: A | Discharge status: Home / Self Care | Payer: Medicaid Other | Source: Ambulatory Visit | Attending: Obstetrics and Gynecology | Admitting: Obstetrics and Gynecology

## 2013-01-07 ENCOUNTER — Encounter (HOSPITAL_COMMUNITY): Payer: Self-pay | Admitting: *Deleted

## 2013-01-07 DIAGNOSIS — O479 False labor, unspecified: Secondary | ICD-10-CM | POA: Insufficient documentation

## 2013-01-07 DIAGNOSIS — O26893 Other specified pregnancy related conditions, third trimester: Secondary | ICD-10-CM

## 2013-01-07 DIAGNOSIS — O471 False labor at or after 37 completed weeks of gestation: Secondary | ICD-10-CM

## 2013-01-07 DIAGNOSIS — N949 Unspecified condition associated with female genital organs and menstrual cycle: Secondary | ICD-10-CM | POA: Insufficient documentation

## 2013-01-07 DIAGNOSIS — Z3493 Encounter for supervision of normal pregnancy, unspecified, third trimester: Secondary | ICD-10-CM

## 2013-01-07 LAB — WET PREP, GENITAL
Clue Cells Wet Prep HPF POC: NONE SEEN
Yeast Wet Prep HPF POC: NONE SEEN

## 2013-01-07 NOTE — MAU Note (Signed)
Patient states she is having contractions every 10 minutes. Denies bleeding or leaking and reports good fetal movement.  

## 2013-01-07 NOTE — MAU Provider Note (Signed)
  History     CSN: [id number]  Arrival date and time: 01/07/13 1759   First Provider Initiated Contact with Patient 01/07/13 1851      Chief Complaint  Patient presents with  . Labor Eval   HPI  Pt is a G1P0 at 39.3 wks IUP here with report of wet vaginal discharge x one today.  No report of vaginal bleeding, continuous leaking of fluid or vaginal odor.  Intermittent contractions.  No UTI symptoms.   Past Medical History  Diagnosis Date  . No pertinent past medical history   . Brain tumor (benign)   . Gonorrhea   . Chlamydia     Past Surgical History  Procedure Laterality Date  . No past surgeries      Family History  Problem Relation Age of Onset  . Other Neg Hx   . Diabetes Paternal Grandfather     History  Substance Use Topics  . Smoking status: Former Smoker    Types: Cigarettes    Quit date: 10/05/2010  . Smokeless tobacco: Not on file  . Alcohol Use: No    Allergies: No Known Allergies  Prescriptions prior to admission  Medication Sig Dispense Refill  . Prenatal Vit-Fe Fumarate-FA (PRENATAL MULTIVITAMIN) TABS Take 1 tablet by mouth every morning.        Review of Systems  Genitourinary:       Vaginal discharge  All other systems reviewed and are negative.   Physical Exam   Blood pressure 139/82, pulse 72, temperature 98.5 F (36.9 C), temperature source Oral, resp. rate 20, height 5\' 7"  (1.702 m), weight 123.469 kg (272 lb 3.2 oz), last menstrual period 03/30/2012, SpO2 100.00%.  Physical Exam  Constitutional: She is oriented to person, place, and time. She appears well-developed and well-nourished. No distress.  HENT:  Head: Normocephalic.  Neck: Normal range of motion. Neck supple.  Cardiovascular: Normal rate, regular rhythm and normal heart sounds.   Respiratory: Effort normal and breath sounds normal.  GI: Soft. There is no tenderness.  Genitourinary: No bleeding around the vagina. Vaginal discharge (mucusy) found.  Musculoskeletal:  Normal range of motion. She exhibits no edema.  Neurological: She is alert and oriented to person, place, and time.  Skin: Skin is warm and dry.    MAU Course  Procedures Results for orders placed during the hospital encounter of 01/07/13 (from the past 24 hour(s))  WET PREP, GENITAL     Status: Abnormal   Collection Time    01/07/13  6:58 PM      Result Value Range   Yeast Wet Prep HPF POC NONE SEEN  NONE SEEN   Trich, Wet Prep NONE SEEN  NONE SEEN   Clue Cells Wet Prep HPF POC NONE SEEN  NONE SEEN   WBC, Wet Prep HPF POC RARE (*) NONE SEEN    FHR 130's, +accel, reactive Toco - irregular  Assessment and Plan  Normal Vaginal Discharge Category I FHR Tracing  Plan: DC to home Provided reassurance  [facility] 01/07/2013, 7:22 PM

## 2013-01-07 NOTE — MAU Provider Note (Signed)
  History     CSN: [id number]  Arrival date and time: 01/07/13 2302   None     Chief Complaint  Patient presents with  . Contractions  . Vaginal Bleeding   HPI 22 y.o. G1P0 at [redacted]w[redacted]d with bloody discharge and contractions. Ctx started about 4 PM, now 5 minutes apart. Was seen earlier today in MAU. Now having some bleeding when she goes to the bathroom - blood + mucous. Baby moving some, not as much.   Patient receives prenatal care at [facility], no complications this pregnancy.    OB History   Grav Para Term Preterm Abortions TAB SAB Ect Mult Living   1               Past Medical History  Diagnosis Date  . No pertinent past medical history   . Brain tumor (benign)   . Gonorrhea   . Chlamydia     Past Surgical History  Procedure Laterality Date  . No past surgeries      Family History  Problem Relation Age of Onset  . Other Neg Hx   . Diabetes Paternal Grandfather     History  Substance Use Topics  . Smoking status: Former Smoker    Types: Cigarettes    Quit date: 10/05/2010  . Smokeless tobacco: Not on file  . Alcohol Use: No    Allergies: No Known Allergies  Prescriptions prior to admission  Medication Sig Dispense Refill  . Prenatal Vit-Fe Fumarate-FA (PRENATAL MULTIVITAMIN) TABS Take 1 tablet by mouth every morning.        ROS Physical Exam   Blood pressure 129/78, pulse 88, temperature 97.5 F (36.4 C), temperature source Oral, resp. rate 18, last menstrual period 03/30/2012, SpO2 100.00%.  Physical Exam  Constitutional: She is oriented to person, place, and time. She appears well-developed and well-nourished. No distress.  HENT:  Head: Normocephalic and atraumatic.  Eyes: Conjunctivae and EOM are normal.  Neck: Normal range of motion. Neck supple.  Cardiovascular: Normal rate.   Respiratory: Effort normal. No respiratory distress.  GI: There is no tenderness. There is no rebound.  Gravid. Size appropriate for dates.  Genitourinary:  Normal  external genitalia and vagina. Minimal blood on glove after cervical check. Cervix 1/50/-2.  Musculoskeletal: Normal range of motion. She exhibits no edema and no tenderness.  Neurological: She is alert and oriented to person, place, and time.  Skin: Skin is warm and dry.  Psychiatric: She has a normal mood and affect.    MAU Course  Procedures  FHTs:  135, moderate variability, accels present, no decels.  TOCO:  q 5-6 minutes  Recheck cervix:  1/50/-2:  No change  Assessment and Plan  22 y.o. G1P0 at [redacted]w[redacted]d with contractions and bloody show. - No cervical change. - Discharge home, labor precautions  Napoleon Form 01/07/2013, 11:47 PM

## 2013-01-07 NOTE — MAU Note (Signed)
Pt states that contractions started around 1400. Was seen in MAU and discharged around 2000. States after she got home she noticed blood when she went to the bathroom. Denies leaking of fluid.

## 2013-01-07 NOTE — Telephone Encounter (Signed)
Called Maria Morgan and gave her results of GC/Chlam/GBS( negative). Patient also asking if she could be scheduled for an induction- reviewed chart and informed patient must have medical reason to be induced and per chart I don't see a medical reason. Patient states tired of being pregnant and having back pain for last few days- denies any other concerns.. She cancelled her appointment yesterday for obfu and was rescheduled for 01/20/13. Informed patient I would have front desk registration to schedule her for 4/1 or 4/2 and call her with an appointment.since she is 39 weeks and we need to see her weekly for obfu. We discussed she could be having back labor and to start noticing if there is contractions with it and/or if there is a pattern to her back pain , discussed other signs of labor and to come to MAU anytime if she thinks she is in labor or any problems. Patient voices understanding.

## 2013-01-07 NOTE — MAU Note (Signed)
Pt states she has been having contractions that started around 1400.

## 2013-01-08 ENCOUNTER — Encounter (HOSPITAL_COMMUNITY): Payer: Self-pay | Admitting: *Deleted

## 2013-01-08 ENCOUNTER — Inpatient Hospital Stay (EMERGENCY_DEPARTMENT_HOSPITAL)
Admission: AD | Admit: 2013-01-08 | Discharge: 2013-01-08 | Disposition: A | Discharge status: Home / Self Care | Payer: Medicaid Other | Source: Ambulatory Visit | Attending: Obstetrics & Gynecology | Admitting: Obstetrics & Gynecology

## 2013-01-08 DIAGNOSIS — O471 False labor at or after 37 completed weeks of gestation: Secondary | ICD-10-CM

## 2013-01-08 DIAGNOSIS — Z3493 Encounter for supervision of normal pregnancy, unspecified, third trimester: Secondary | ICD-10-CM

## 2013-01-08 DIAGNOSIS — O479 False labor, unspecified: Secondary | ICD-10-CM

## 2013-01-08 MED ORDER — NALBUPHINE SYRINGE 5 MG/0.5 ML
5.0000 mg | INJECTION | Freq: Once | INTRAMUSCULAR | Status: AC
  Administered 2013-01-08: 5 mg via INTRAVENOUS
  Filled 2013-01-08: qty 0.5

## 2013-01-08 MED ORDER — OXYCODONE-ACETAMINOPHEN 5-325 MG PO TABS
1.0000 | ORAL_TABLET | Freq: Once | ORAL | Status: AC
  Administered 2013-01-08: 1 via ORAL
  Filled 2013-01-08: qty 1

## 2013-01-08 MED ORDER — ZOLPIDEM TARTRATE 5 MG PO TABS
5.0000 mg | ORAL_TABLET | Freq: Once | ORAL | Status: AC
  Administered 2013-01-08: 5 mg via ORAL
  Filled 2013-01-08: qty 1

## 2013-01-08 NOTE — MAU Provider Note (Signed)
Attestation of Attending Supervision of Advanced Practitioner (CNM/NP): Evaluation and management procedures were performed by the Advanced Practitioner under my supervision and collaboration.  I have reviewed the Advanced Practitioner's note and chart, and I agree with the management and plan.  Jameisha Stofko 01/08/2013 8:13 AM   

## 2013-01-08 NOTE — MAU Note (Signed)
Contractions since yesterday and seen in MAU last night. This afternoon ctxs more regular. Leaking fld since 1600 in small amts.

## 2013-01-08 NOTE — MAU Provider Note (Signed)
Attestation of Attending Supervision of Advanced Practitioner (CNM/NP): Evaluation and management procedures were performed by the Advanced Practitioner under my supervision and collaboration.  I have reviewed the Advanced Practitioner's note and chart, and I agree with the management and plan.  Maria Morgan 01/08/2013 8:13 AM

## 2013-01-08 NOTE — MAU Note (Signed)
Pt. Here for contractions that started last night but have gotten closer together 4-22mins per pt. For the last hour. Feels that she lost her mucous plug around 1630-1700 today and has had some mucousy discharge since then. Unsure if it is her water. Does note to have streaks of blood when urinating but is with mucous. Baby is moving well.

## 2013-01-08 NOTE — MAU Provider Note (Signed)
  History     CSN: [id number]  Arrival date and time: 01/08/13 8295   First Provider Initiated Contact with Patient 01/08/13 2142      Chief Complaint  Patient presents with  . Contractions   HPI Ms Dulski is a 22yo G1P0 at 39.4wks who presents for contractions. Denies leak or bldg. Reports +FM. Has been seen at [facility] this preg, which has been essentially unremarkable. Also seen in MAU this time last PM with same cx exam.  OB History   Grav Para Term Preterm Abortions TAB SAB Ect Mult Living   1               Past Medical History  Diagnosis Date  . No pertinent past medical history   . Brain tumor (benign)   . Gonorrhea   . Chlamydia     Past Surgical History  Procedure Laterality Date  . No past surgeries      Family History  Problem Relation Age of Onset  . Other Neg Hx   . Diabetes Paternal Grandfather     History  Substance Use Topics  . Smoking status: Former Smoker    Types: Cigarettes    Quit date: 10/05/2010  . Smokeless tobacco: Not on file  . Alcohol Use: No    Allergies: No Known Allergies  Prescriptions prior to admission  Medication Sig Dispense Refill  . Prenatal Vit-Fe Fumarate-FA (PRENATAL MULTIVITAMIN) TABS Take 1 tablet by mouth daily.         ROS Physical Exam   Blood pressure 125/89, pulse 106, temperature 98 F (36.7 C), resp. rate 20, height 5\' 7"  (1.702 m), weight 272 lb 12.8 oz (123.741 kg), last menstrual period 03/30/2012, SpO2 100.00%.  Physical Exam  Constitutional: She is oriented to person, place, and time. She appears well-developed.  HENT:  Head: Normocephalic.  Cardiovascular: Normal rate.   Respiratory: Effort normal.  Genitourinary: Vagina normal.  Cx unchanged 1.5/50%/-2, sm pink on glove  Musculoskeletal: Normal range of motion.  Neurological: She is alert and oriented to person, place, and time.  Skin: Skin is warm and dry.  Psychiatric: She has a normal mood and affect. Her behavior is normal. Thought content  normal.    MAU Course  Procedures  MDM Had cx exams 1 hr 30 mins apart without change  Assessment and Plan  IUP at 39.4wks False labor  Given Ambien and Percocet prior to d/c D/C home with labor precautions and told when to come back If no labor, keep appt at clinic on 01/13/13.  Cam Hai 01/08/2013, 10:05 PM

## 2013-01-09 ENCOUNTER — Encounter (HOSPITAL_COMMUNITY): Payer: Self-pay | Admitting: *Deleted

## 2013-01-09 ENCOUNTER — Inpatient Hospital Stay (HOSPITAL_COMMUNITY): Payer: Medicaid Other | Admitting: Anesthesiology

## 2013-01-09 ENCOUNTER — Encounter (HOSPITAL_COMMUNITY): Payer: Self-pay | Admitting: Anesthesiology

## 2013-01-09 ENCOUNTER — Inpatient Hospital Stay (HOSPITAL_COMMUNITY)
Admission: AD | Admit: 2013-01-09 | Discharge: 2013-01-11 | DRG: 775 | Disposition: A | Discharge status: Home / Self Care | Payer: Medicaid Other | Source: Ambulatory Visit | Attending: Obstetrics & Gynecology | Admitting: Obstetrics & Gynecology

## 2013-01-09 DIAGNOSIS — Z3493 Encounter for supervision of normal pregnancy, unspecified, third trimester: Secondary | ICD-10-CM

## 2013-01-09 LAB — CBC
HCT: 34.1 % — ABNORMAL LOW (ref 36.0–46.0)
Hemoglobin: 12 g/dL (ref 12.0–15.0)
MCHC: 35.2 g/dL (ref 30.0–36.0)
MCV: 83.8 fL (ref 78.0–100.0)
RDW: 12.4 % (ref 11.5–15.5)
WBC: 6.6 10*3/uL (ref 4.0–10.5)

## 2013-01-09 LAB — TYPE AND SCREEN
ABO/RH(D): A POS
Antibody Screen: NEGATIVE

## 2013-01-09 MED ORDER — CITRIC ACID-SODIUM CITRATE 334-500 MG/5ML PO SOLN: 30.0000 mL | ORAL | Status: DC | PRN

## 2013-01-09 MED ORDER — FLEET ENEMA 7-19 GM/118ML RE ENEM: 1.0000 | ENEMA | RECTAL | Status: DC | PRN

## 2013-01-09 MED ORDER — FERROUS SULFATE 325 (65 FE) MG PO TABS
325.0000 mg | ORAL_TABLET | Freq: Two times a day (BID) | ORAL | Status: DC
  Administered 2013-01-09 – 2013-01-11 (×4): 325 mg via ORAL
  Filled 2013-01-09 (×4): qty 1

## 2013-01-09 MED ORDER — DIBUCAINE 1 % RE OINT: 1.0000 "application " | TOPICAL_OINTMENT | RECTAL | Status: DC | PRN

## 2013-01-09 MED ORDER — WITCH HAZEL-GLYCERIN EX PADS: 1.0000 "application " | MEDICATED_PAD | CUTANEOUS | Status: DC | PRN

## 2013-01-09 MED ORDER — TETANUS-DIPHTH-ACELL PERTUSSIS 5-2.5-18.5 LF-MCG/0.5 IM SUSP
0.5000 mL | Freq: Once | INTRAMUSCULAR | Status: AC
  Administered 2013-01-11: 0.5 mL via INTRAMUSCULAR

## 2013-01-09 MED ORDER — TERBUTALINE SULFATE 1 MG/ML IJ SOLN: 0.2500 mg | Freq: Once | INTRAMUSCULAR | Status: DC | PRN

## 2013-01-09 MED ORDER — OXYCODONE-ACETAMINOPHEN 5-325 MG PO TABS: 1.0000 | ORAL_TABLET | ORAL | Status: DC | PRN

## 2013-01-09 MED ORDER — PHENYLEPHRINE 40 MCG/ML (10ML) SYRINGE FOR IV PUSH (FOR BLOOD PRESSURE SUPPORT)
80.0000 ug | PREFILLED_SYRINGE | INTRAVENOUS | Status: DC | PRN
  Filled 2013-01-09: qty 2

## 2013-01-09 MED ORDER — LANOLIN HYDROUS EX OINT: TOPICAL_OINTMENT | CUTANEOUS | Status: DC | PRN

## 2013-01-09 MED ORDER — FENTANYL CITRATE 0.05 MG/ML IJ SOLN
100.0000 ug | INTRAMUSCULAR | Status: DC | PRN
  Administered 2013-01-09 (×3): 100 ug via INTRAVENOUS
  Filled 2013-01-09 (×3): qty 2

## 2013-01-09 MED ORDER — IBUPROFEN 600 MG PO TABS: 600.0000 mg | ORAL_TABLET | Freq: Four times a day (QID) | ORAL | Status: DC | PRN

## 2013-01-09 MED ORDER — OXYTOCIN 40 UNITS IN LACTATED RINGERS INFUSION - SIMPLE MED: 62.5000 mL/h | INTRAVENOUS | Status: DC | PRN

## 2013-01-09 MED ORDER — OXYTOCIN 40 UNITS IN LACTATED RINGERS INFUSION - SIMPLE MED
1.0000 m[IU]/min | INTRAVENOUS | Status: DC
  Administered 2013-01-09: 2 m[IU]/min via INTRAVENOUS

## 2013-01-09 MED ORDER — EPHEDRINE 5 MG/ML INJ
10.0000 mg | INTRAVENOUS | Status: DC | PRN
  Filled 2013-01-09: qty 4
  Filled 2013-01-09: qty 2

## 2013-01-09 MED ORDER — LIDOCAINE HCL (PF) 1 % IJ SOLN
30.0000 mL | INTRAMUSCULAR | Status: DC | PRN
  Filled 2013-01-09: qty 30

## 2013-01-09 MED ORDER — PHENYLEPHRINE 40 MCG/ML (10ML) SYRINGE FOR IV PUSH (FOR BLOOD PRESSURE SUPPORT)
80.0000 ug | PREFILLED_SYRINGE | INTRAVENOUS | Status: DC | PRN
  Filled 2013-01-09: qty 5
  Filled 2013-01-09: qty 2

## 2013-01-09 MED ORDER — ONDANSETRON HCL 4 MG/2ML IJ SOLN: 4.0000 mg | Freq: Four times a day (QID) | INTRAMUSCULAR | Status: DC | PRN

## 2013-01-09 MED ORDER — ACETAMINOPHEN 325 MG PO TABS: 650.0000 mg | ORAL_TABLET | ORAL | Status: DC | PRN

## 2013-01-09 MED ORDER — LACTATED RINGERS IV SOLN
500.0000 mL | INTRAVENOUS | Status: DC | PRN
  Administered 2013-01-09: 1000 mL via INTRAVENOUS

## 2013-01-09 MED ORDER — SENNOSIDES-DOCUSATE SODIUM 8.6-50 MG PO TABS
2.0000 | ORAL_TABLET | Freq: Every day | ORAL | Status: DC
  Administered 2013-01-09 – 2013-01-10 (×2): 2 via ORAL

## 2013-01-09 MED ORDER — SIMETHICONE 80 MG PO CHEW: 80.0000 mg | CHEWABLE_TABLET | ORAL | Status: DC | PRN

## 2013-01-09 MED ORDER — OXYTOCIN 40 UNITS IN LACTATED RINGERS INFUSION - SIMPLE MED
62.5000 mL/h | INTRAVENOUS | Status: DC
  Administered 2013-01-09: 62.5 mL/h via INTRAVENOUS
  Filled 2013-01-09: qty 1000

## 2013-01-09 MED ORDER — ONDANSETRON HCL 4 MG/2ML IJ SOLN: 4.0000 mg | INTRAMUSCULAR | Status: DC | PRN

## 2013-01-09 MED ORDER — FENTANYL 2.5 MCG/ML BUPIVACAINE 1/10 % EPIDURAL INFUSION (WH - ANES)
14.0000 mL/h | INTRAMUSCULAR | Status: DC | PRN
  Filled 2013-01-09: qty 125

## 2013-01-09 MED ORDER — IBUPROFEN 600 MG PO TABS
600.0000 mg | ORAL_TABLET | Freq: Four times a day (QID) | ORAL | Status: DC
  Administered 2013-01-09 – 2013-01-11 (×8): 600 mg via ORAL
  Filled 2013-01-09 (×7): qty 1

## 2013-01-09 MED ORDER — OXYTOCIN BOLUS FROM INFUSION: 500.0000 mL | INTRAVENOUS | Status: DC

## 2013-01-09 MED ORDER — LACTATED RINGERS IV SOLN
500.0000 mL | Freq: Once | INTRAVENOUS | Status: AC
  Administered 2013-01-09: 08:00:00 via INTRAVENOUS

## 2013-01-09 MED ORDER — BISACODYL 10 MG RE SUPP: 10.0000 mg | Freq: Every day | RECTAL | Status: DC | PRN

## 2013-01-09 MED ORDER — FLEET ENEMA 7-19 GM/118ML RE ENEM: 1.0000 | ENEMA | Freq: Every day | RECTAL | Status: DC | PRN

## 2013-01-09 MED ORDER — ZOLPIDEM TARTRATE 5 MG PO TABS: 5.0000 mg | ORAL_TABLET | Freq: Every evening | ORAL | Status: DC | PRN

## 2013-01-09 MED ORDER — DIPHENHYDRAMINE HCL 50 MG/ML IJ SOLN: 12.5000 mg | INTRAMUSCULAR | Status: DC | PRN

## 2013-01-09 MED ORDER — LACTATED RINGERS IV SOLN
INTRAVENOUS | Status: DC
  Administered 2013-01-09: 09:00:00 via INTRAVENOUS

## 2013-01-09 MED ORDER — ONDANSETRON HCL 4 MG PO TABS: 4.0000 mg | ORAL_TABLET | ORAL | Status: DC | PRN

## 2013-01-09 MED ORDER — LIDOCAINE HCL (PF) 1 % IJ SOLN
INTRAMUSCULAR | Status: DC | PRN
  Administered 2013-01-09: 9 mL
  Administered 2013-01-09: 8 mL

## 2013-01-09 MED ORDER — FENTANYL 2.5 MCG/ML BUPIVACAINE 1/10 % EPIDURAL INFUSION (WH - ANES)
INTRAMUSCULAR | Status: DC | PRN
  Administered 2013-01-09: 14 mL/h via EPIDURAL

## 2013-01-09 MED ORDER — MEASLES, MUMPS & RUBELLA VAC ~~LOC~~ INJ
0.5000 mL | INJECTION | Freq: Once | SUBCUTANEOUS | Status: DC
  Filled 2013-01-09: qty 0.5

## 2013-01-09 MED ORDER — PRENATAL MULTIVITAMIN CH
1.0000 | ORAL_TABLET | Freq: Every day | ORAL | Status: DC
  Administered 2013-01-10 – 2013-01-11 (×2): 1 via ORAL
  Filled 2013-01-09 (×2): qty 1

## 2013-01-09 MED ORDER — DIPHENHYDRAMINE HCL 25 MG PO CAPS: 25.0000 mg | ORAL_CAPSULE | Freq: Four times a day (QID) | ORAL | Status: DC | PRN

## 2013-01-09 MED ORDER — BENZOCAINE-MENTHOL 20-0.5 % EX AERO
1.0000 "application " | INHALATION_SPRAY | CUTANEOUS | Status: DC | PRN
  Administered 2013-01-09: 1 via TOPICAL
  Filled 2013-01-09: qty 56

## 2013-01-09 MED ORDER — EPHEDRINE 5 MG/ML INJ
10.0000 mg | INTRAVENOUS | Status: DC | PRN
  Filled 2013-01-09: qty 2

## 2013-01-09 NOTE — Progress Notes (Signed)
After attempting to assist pt to breast feed infant lactation consultant called.  LC with another pt at present

## 2013-01-09 NOTE — Progress Notes (Signed)
Lactation consultant assisting pt with breast feeding 

## 2013-01-09 NOTE — Anesthesia Procedure Notes (Signed)
Epidural Patient location during procedure: OB Start time: 01/09/2013 8:21 AM End time: 01/09/2013 8:25 AM  Staffing Anesthesiologist: Sandrea Hughs Performed by: anesthesiologist   Preanesthetic Checklist Completed: patient identified, site marked, surgical consent, pre-op evaluation, timeout performed, IV checked, risks and benefits discussed and monitors and equipment checked  Epidural Patient position: sitting Prep: site prepped and draped and DuraPrep Patient monitoring: continuous pulse ox and blood pressure Approach: midline Injection technique: LOR air  Needle:  Needle type: Tuohy  Needle gauge: 17 G Needle length: 9 cm and 9 Needle insertion depth: 7 cm Catheter type: closed end flexible Catheter size: 19 Gauge Catheter at skin depth: 13 cm Test dose: negative and Other  Assessment Sensory level: T9 Events: blood not aspirated, injection not painful, no injection resistance, negative IV test and no paresthesia  Additional Notes Reason for block:procedure for pain

## 2013-01-09 NOTE — Anesthesia Preprocedure Evaluation (Signed)
Anesthesia Evaluation  Patient identified by MRN, date of birth, ID band Patient awake    Reviewed: Allergy & Precautions, H&P , NPO status , Patient's Chart, lab work & pertinent test results  Airway Mallampati: II TM Distance: >3 FB Neck ROM: full    Dental no notable dental hx.    Pulmonary neg pulmonary ROS,  breath sounds clear to auscultation  Pulmonary exam normal       Cardiovascular negative cardio ROS  Rate:Normal     Neuro/Psych negative neurological ROS  negative psych ROS   GI/Hepatic negative GI ROS, Neg liver ROS,   Endo/Other  Morbid obesity  Renal/GU negative Renal ROS  negative genitourinary   Musculoskeletal negative musculoskeletal ROS (+)   Abdominal (+) + obese,   Peds negative pediatric ROS (+)  Hematology negative hematology ROS (+)   Anesthesia Other Findings   Reproductive/Obstetrics (+) Pregnancy                           Anesthesia Physical Anesthesia Plan  ASA: III  Anesthesia Plan: Epidural   Post-op Pain Management:    Induction:   Airway Management Planned:   Additional Equipment:   Intra-op Plan:   Post-operative Plan:   Informed Consent: I have reviewed the patients History and Physical, chart, labs and discussed the procedure including the risks, benefits and alternatives for the proposed anesthesia with the patient or authorized representative who has indicated his/her understanding and acceptance.     Plan Discussed with:   Anesthesia Plan Comments:         Anesthesia Quick Evaluation

## 2013-01-09 NOTE — Progress Notes (Signed)
Unable to void.bladder  Scan for 250 i/o cath for 200

## 2013-01-09 NOTE — Progress Notes (Signed)
To BS via w/c °

## 2013-01-09 NOTE — Progress Notes (Signed)
Patient ID: Maria Morgan, female   DOB: 1991-07-30, 22 y.o.   MRN: [id number]   S:  Pt comfortable  O: Filed Vitals:   01/09/13 1001 01/09/13 1031 01/09/13 1102 01/09/13 1128  BP: 135/92 127/63 107/49   Pulse: 90 98 87   Temp: 97.5 F (36.4 C)     TempSrc: Oral     Resp: 20 16 20 16   Height:      Weight:      SpO2:        Cervix:  7.5, stretchy, 90%, 0 station, anterior  FHTs:  135, mod variability, accels present, decels - late in nature with good variability throughout. Cat II CTX:  q 5-6 min  A/P 34 y.o. G1P0 with SOL - small progression after AROM - Start pitocin - Cat II strip, watch lates/variables  Napoleon Form, MD

## 2013-01-09 NOTE — Progress Notes (Signed)
Report called to Dana RN in Bs.  

## 2013-01-09 NOTE — MAU Note (Signed)
Seen earlier in MAU and sent home. Took pain and sleep med before leaving but has not slept. Ctxs worse

## 2013-01-09 NOTE — Progress Notes (Signed)
Patient ID: Maria Morgan, female   DOB: 08/13/1991, 22 y.o.   MRN: [id number]   S: comfortable with epidural  O:   Filed Vitals:   01/09/13 0931  BP: 111/59  Pulse: 109  Temp:   Resp: 16    Cervix:  7/90/0 AROM, clear  FHTs:  130, mod var, accels present no decels CTX:  q 2-6 min  A/P 22 y.o. G1P0 at [redacted]w[redacted]d with SOL - Ctx pattern irregular, but making some cervical change - AROM, clear - will start pitocin if not in better pattern/changing next check - Anticipate SVD  Napoleon Form, MD

## 2013-01-09 NOTE — Progress Notes (Signed)
Dr Anastasia Fiedler notified of pt's admission and status. Will see pt

## 2013-01-09 NOTE — Progress Notes (Signed)
Dr Thad Ranger in room

## 2013-01-09 NOTE — Progress Notes (Signed)
Dr Thad Ranger no;tified by phone

## 2013-01-09 NOTE — Progress Notes (Signed)
iV bolus started Pitocin off

## 2013-01-09 NOTE — Progress Notes (Signed)
Maria Morgan notified of pt's admission, not Dr Anastasia Fiedler.

## 2013-01-09 NOTE — H&P (Signed)
Maria Morgan is a 22 y.o. female G1P0 at [redacted]w[redacted]d presenting for contractions. She has received care in the low risk clinic. She began experiencing contractions yesterday, has been seen in MAU multiple times for labor checks. Cervix has remained dilated at 1.5 cm w/ previous checks. She notes that since her last discharge approx 8 hours ago her contractions have been occuring more frequently and with stronger intensity. She has had some leakage of fluid, but no other vaginal bleeding or discharge. No change in fetal movement.   Maternal Medical History:  Reason for admission: Nausea.    OB History   Grav Para Term Preterm Abortions TAB SAB Ect Mult Living   1              Past Medical History  Diagnosis Date  . No pertinent past medical history   . Brain tumor (benign)   . Gonorrhea   . Chlamydia    Past Surgical History  Procedure Laterality Date  . No past surgeries     Family History: family history includes Diabetes in her paternal grandfather.  There is no history of Other. Social History:  reports that she quit smoking about 2 years ago. Her smoking use included Cigarettes. She smoked 0.00 packs per day. She does not have any smokeless tobacco history on file. She reports that she does not drink alcohol or use illicit drugs.   Prenatal Transfer Tool  Maternal Diabetes: No Genetic Screening: Normal Maternal Ultrasounds/Referrals: Normal Fetal Ultrasounds or other Referrals:  None Maternal Substance Abuse:  No Significant Maternal Medications:  None Significant Maternal Lab Results:  None Other Comments:  None  Clinic: [facility]  Genetic Screen  Quad Screen/MSAFP: Negative   Anatomic [place]  Insufficient, will repeat soon   Glucose Screen  Early 1 hr GTT 92, repeat at 28 weeks   GBS  neg   Feeding Preference  Breast   Contraception  Undecided   Circumcision  Yes     Review of Systems  Constitutional: Negative for fever and chills.  Eyes: Negative for blurred  vision and double vision.  Cardiovascular: Negative for chest pain and leg swelling.  Gastrointestinal: Positive for abdominal pain. Negative for nausea, vomiting, diarrhea and constipation.  Genitourinary: Negative for dysuria, hematuria and flank pain.  Neurological: Negative for dizziness, sensory change and headaches.    Dilation: 3 Effacement (%): 100 Station: -1 Exam by:: Christeen Douglas Blood pressure 118/51, pulse 100, temperature 97.6 F (36.4 C), temperature source Oral, resp. rate 24, last menstrual period 03/30/2012.  Exam Physical Exam  Nursing note and vitals reviewed. Constitutional: She is oriented to person, place, and time. She appears well-developed and well-nourished.  Appears very uncomfortable in bed  HENT:  Head: Normocephalic and atraumatic.  Eyes: Conjunctivae and EOM are normal. Pupils are equal, round, and reactive to light.  Cardiovascular: Normal rate, regular rhythm and intact distal pulses.   1+ pretibial pitting edema bilateral  Respiratory: Effort normal and breath sounds normal. No respiratory distress.  GI: Soft. She exhibits distension (gravid). There is no tenderness. There is no rebound and no guarding.  Neurological: She is alert and oriented to person, place, and time.  Skin: Skin is warm and dry.  Psychiatric: She has a normal mood and affect. Her behavior is normal. Judgment and thought content normal.    FHT: baseline 140 bpm, minimal variability, accels present, variable decels, category 2 tracing Toco: minimal changes seen  Prenatal labs: ABO, Rh: --/--/A POS (10/08 1945) Antibody:  Rubella: 74.3 (10/08 1945) RPR: NON REAC (01/15 1013)  HBsAg: NEGATIVE (10/08 1945)  HIV: NON REACTIVE (01/15 1013)  GBS: Negative (03/28 0000)   Assessment/Plan: 22 year old female with spontaneous onset of labor.   Admit to L&D IV fentanyl for pain management until further progression to active labor Plan for epidural Anticipate SVD  Dorrene German 01/09/2013, 4:17 AM  I have seen and examined this patient and I agree with the above. Cam Hai 7:02 AM 01/09/2013

## 2013-01-09 NOTE — Progress Notes (Signed)
Pt very anxious and encouraged in breathing and relaxing techniques with contractions. Significant other at bedside and supportive

## 2013-01-10 NOTE — Anesthesia Postprocedure Evaluation (Signed)
Anesthesia Post Note  Patient: Corporate investment banker  Procedure(s) Performed: * No procedures listed *  Anesthesia type: Epidural  Patient location: Mother/Baby  Post pain: Pain level controlled  Post assessment: Post-op Vital signs reviewed  Last Vitals: BP 122/67  Pulse 92  Temp(Src) 36.7 C (Oral)  Resp 18  Ht 5\' 7"  (1.702 m)  Wt 272 lb (123.378 kg)  BMI 42.59 kg/m2  SpO2 99%  LMP 03/30/2012  Post vital signs: Reviewed  Level of consciousness: awake  Complications: No apparent anesthesia complications

## 2013-01-10 NOTE — Progress Notes (Signed)
Post Partum Day 1 Subjective: no complaints, up ad lib, voiding, tolerating PO and + flatus  Objective: Blood pressure 122/67, pulse 92, temperature 98.1 F (36.7 C), temperature source Oral, resp. rate 18, height 5\' 7"  (1.702 m), weight 272 lb (123.378 kg), last menstrual period 03/30/2012, SpO2 99.00%, unknown if currently breastfeeding.  Physical Exam:  General: alert and no distress Lochia: appropriate Uterine Fundus: firm DVT Evaluation: No evidence of DVT seen on physical exam. Negative Homan's sign.   Recent Labs  01/09/13 0430  HGB 12.0  HCT 34.1*    Assessment/Plan: Plan for discharge tomorrow, Breastfeeding and Contraception undecided   LOS: 1 day   Yoel Kaufhold A 01/10/2013, 9:08 AM

## 2013-01-11 MED ORDER — IBUPROFEN 600 MG PO TABS: 600.0000 mg | ORAL_TABLET | Freq: Four times a day (QID) | ORAL | Status: DC

## 2013-01-11 NOTE — Discharge Summary (Signed)
Obstetric Discharge Summary Reason for Admission: onset of labor Prenatal Procedures: none Intrapartum Procedures: spontaneous vaginal delivery Postpartum Procedures: none Complications-Operative and Postpartum: 1st degree perineal laceration Hemoglobin  Date Value Range Status  01/09/2013 12.0  12.0 - 15.0 g/dL Final     HCT  Date Value Range Status  01/09/2013 34.1* 36.0 - 46.0 % Final    Physical Exam:  General: alert, cooperative and no distress Lochia: appropriate Uterine Fundus: firm Incision: none DVT Evaluation: No evidence of DVT seen on physical exam. Calf/Ankle edema is present, trace and equal bilateral. Mild bilateral calf tenderness.  Discharge Diagnoses: Term Pregnancy-delivered  Discharge Information: Date: 01/11/2013 Activity: unrestricted Diet: routine Medications: PNV, Ibuprofen and Colace Condition: stable Instructions: refer to practice specific booklet Discharge to: home Contraception: Currently undecided, provided information regarding options Breast and bottle feeding   Newborn Data: Live born female  Birth Weight: 6 lb 15.3 oz (3155 g) APGAR: 8, 9  Home with mother.  Dorrene German 01/11/2013, 9:43 AM  I saw and examined patient along with student and agree with above note.   Mekhi Lascola 01/17/2013 9:37 AM

## 2013-01-11 NOTE — Progress Notes (Signed)
UR completed 

## 2013-01-13 ENCOUNTER — Encounter: Payer: Medicaid Other | Admitting: Family Medicine

## 2013-01-20 ENCOUNTER — Encounter: Payer: Medicaid Other | Admitting: Advanced Practice Midwife

## 2013-05-12 ENCOUNTER — Ambulatory Visit: Payer: Medicaid Other | Admitting: Obstetrics and Gynecology

## 2013-08-11 ENCOUNTER — Inpatient Hospital Stay (HOSPITAL_COMMUNITY)
Admission: AD | Admit: 2013-08-11 | Discharge: 2013-08-11 | Disposition: A | Discharge status: Home / Self Care | Payer: Medicaid Other | Source: Ambulatory Visit | Attending: Obstetrics and Gynecology | Admitting: Obstetrics and Gynecology

## 2013-08-11 ENCOUNTER — Encounter (HOSPITAL_COMMUNITY): Payer: Self-pay | Admitting: *Deleted

## 2013-08-11 DIAGNOSIS — N912 Amenorrhea, unspecified: Secondary | ICD-10-CM | POA: Insufficient documentation

## 2013-08-11 DIAGNOSIS — Z3202 Encounter for pregnancy test, result negative: Secondary | ICD-10-CM | POA: Insufficient documentation

## 2013-08-11 LAB — CBC
HCT: 36.1 % (ref 36.0–46.0)
MCH: 29.1 pg (ref 26.0–34.0)
MCV: 84.1 fL (ref 78.0–100.0)
RBC: 4.29 MIL/uL (ref 3.87–5.11)
WBC: 5 10*3/uL (ref 4.0–10.5)

## 2013-08-11 LAB — URINALYSIS, ROUTINE W REFLEX MICROSCOPIC
Bilirubin Urine: NEGATIVE
Hgb urine dipstick: NEGATIVE
Ketones, ur: NEGATIVE mg/dL
Specific Gravity, Urine: 1.02 (ref 1.005–1.030)
Urobilinogen, UA: 1 mg/dL (ref 0.0–1.0)
pH: 7 (ref 5.0–8.0)

## 2013-08-11 NOTE — MAU Provider Note (Signed)
[name] 22 y.o. Had an uncomplicated SVD in 3/14 and has not had a period since.  She is not on any birth control and does not want to be pregnant.  Occasionally has unprotected intercourse. Denies hx of irregular menses before now. She states that she had one + UPT at home and 1 negative UPT at home.  Results for orders placed during the hospital encounter of 08/11/13 (from the past 24 hour(s))  URINALYSIS, ROUTINE W REFLEX MICROSCOPIC     Status: None   Collection Time    08/11/13  8:10 PM      Result Value Range   Color, Urine YELLOW  YELLOW   APPearance CLEAR  CLEAR   Specific Gravity, Urine 1.020  1.005 - 1.030   pH 7.0  5.0 - 8.0   Glucose, UA NEGATIVE  NEGATIVE mg/dL   Hgb urine dipstick NEGATIVE  NEGATIVE   Bilirubin Urine NEGATIVE  NEGATIVE   Ketones, ur NEGATIVE  NEGATIVE mg/dL   Protein, ur NEGATIVE  NEGATIVE mg/dL   Urobilinogen, UA 1.0  0.0 - 1.0 mg/dL   Nitrite NEGATIVE  NEGATIVE   Leukocytes, UA NEGATIVE  NEGATIVE  POCT PREGNANCY, URINE     Status: None   Collection Time    08/11/13  8:30 PM      Result Value Range   Preg Test, Ur NEGATIVE  NEGATIVE  HCG, QUANTITATIVE, PREGNANCY     Status: None   Collection Time    08/11/13  8:34 PM      Result Value Range   hCG, Beta Chain, Quant, S <1  <5 mIU/mL  CBC     Status: None   Collection Time    08/11/13  8:34 PM      Result Value Range   WBC 5.0  4.0 - 10.5 K/uL   RBC 4.29  3.87 - 5.11 MIL/uL   Hemoglobin 12.5  12.0 - 15.0 g/dL   HCT 40.9  81.1 - 91.4 %   MCV 84.1  78.0 - 100.0 fL   MCH 29.1  26.0 - 34.0 pg   MCHC 34.6  30.0 - 36.0 g/dL   RDW 78.2  95.6 - 21.3 %   Platelets 220  150 - 400 K/uL     Encouraged pt to f/u with GYN clinic to discuss secondary ammenorrhea and get on some kind of birth control.

## 2013-08-11 NOTE — MAU Note (Signed)
Pt has not had a period since delivery 01/09/2013. Concerned she might be pregnant.  Slight abdominal pressure that comes and goes.  One +UPT at home and one - UPT.

## 2013-08-12 NOTE — MAU Provider Note (Signed)
Attestation of Attending Supervision of Advanced Practitioner: Evaluation and management procedures were performed by the PA/NP/CNM/OB Fellow under my supervision/collaboration. Chart reviewed and agree with management and plan.  Javan Gonzaga V 08/12/2013 2:43 PM

## 2014-03-31 IMAGING — US US OB FOLLOW-UP
2 series · 12 of 28 positions shown · non-contrast
Comparison: none

[Series 1: us ob follow up · 3 of 23 slices shown (1 of 2)]
[im 4/23]
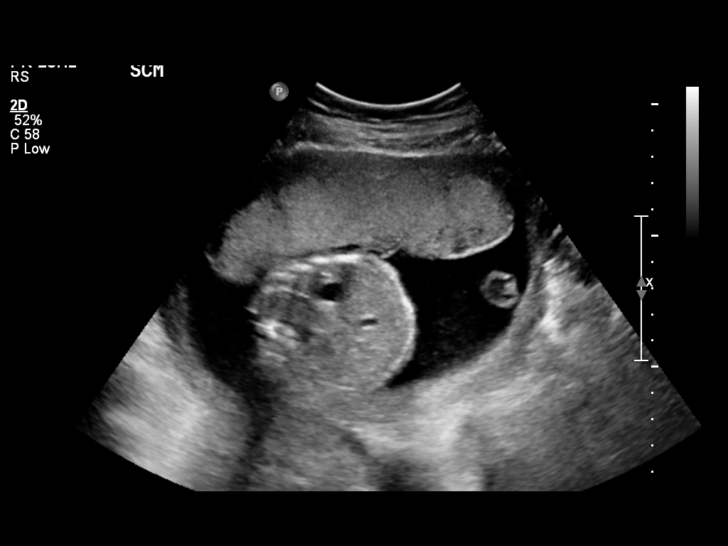
[im 10/23]
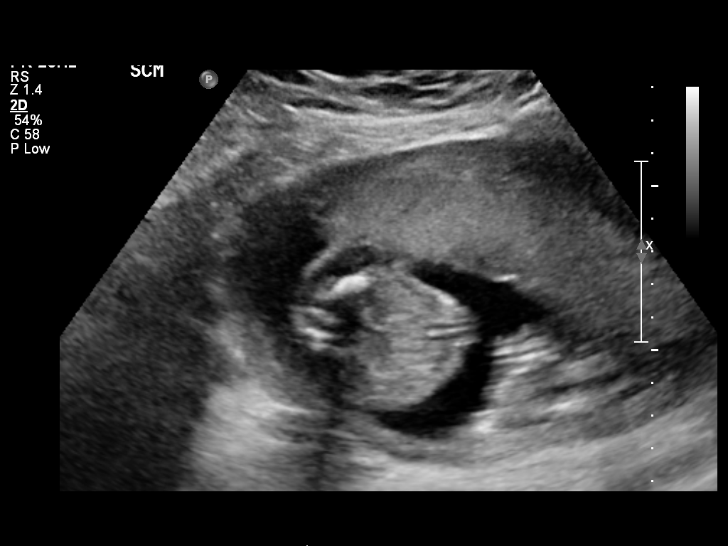
[im 16/23]
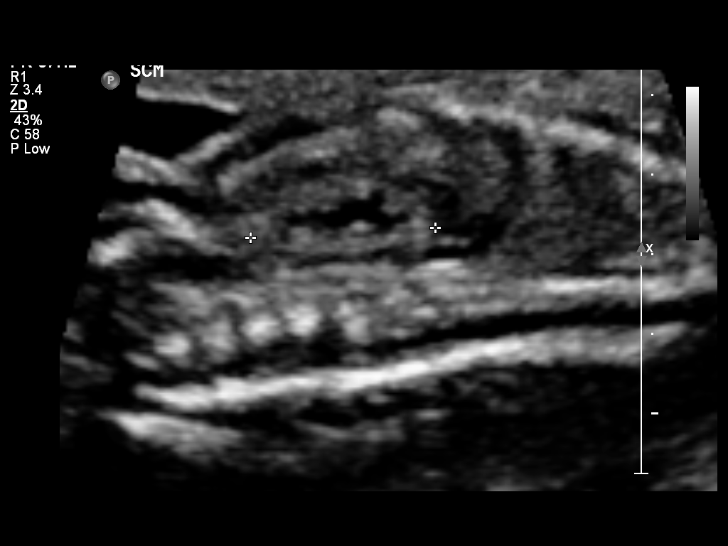

[Series 1: us ob follow up · 9 of 55 slices shown (2 of 2)]
[im 1/55]
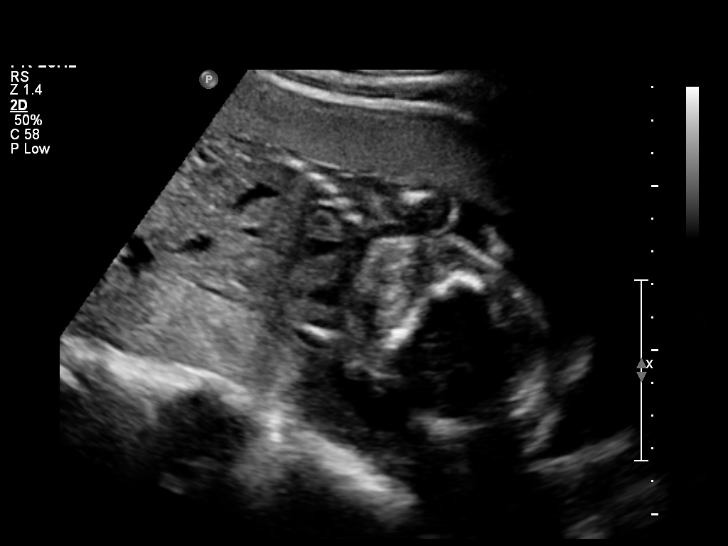
[im 6/55]
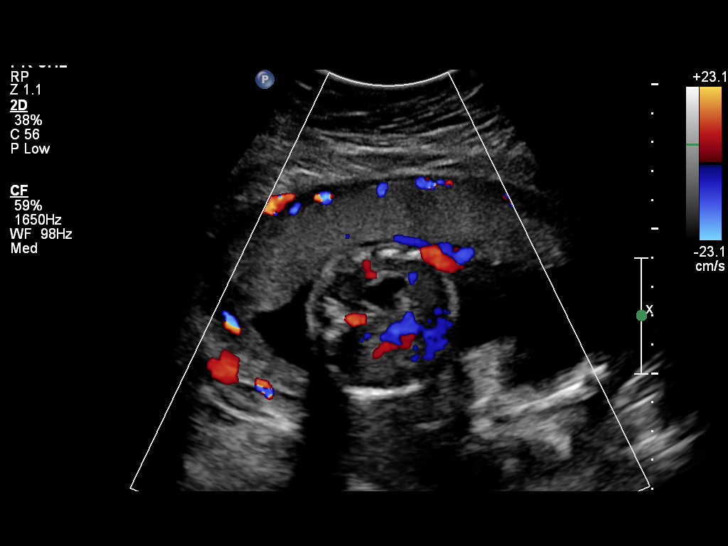
[im 12/55]
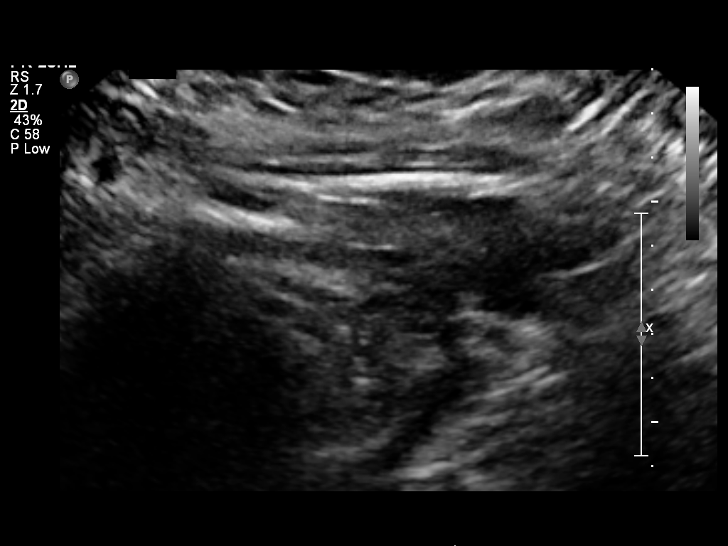
[im 20/55]
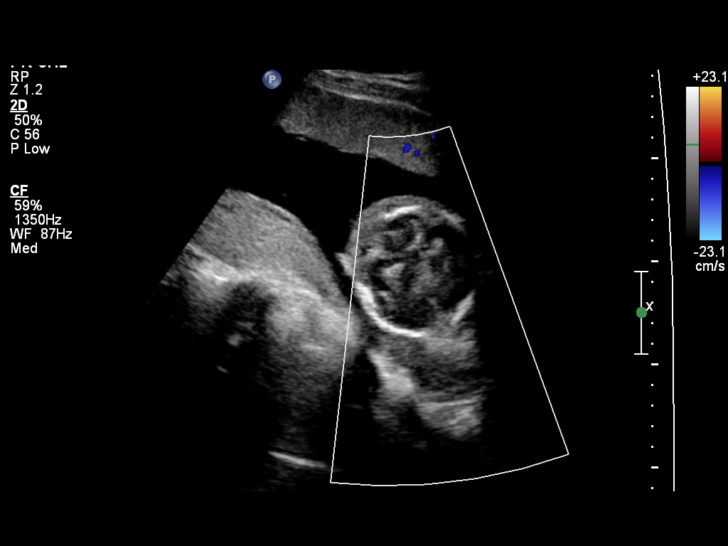
[im 26/55]
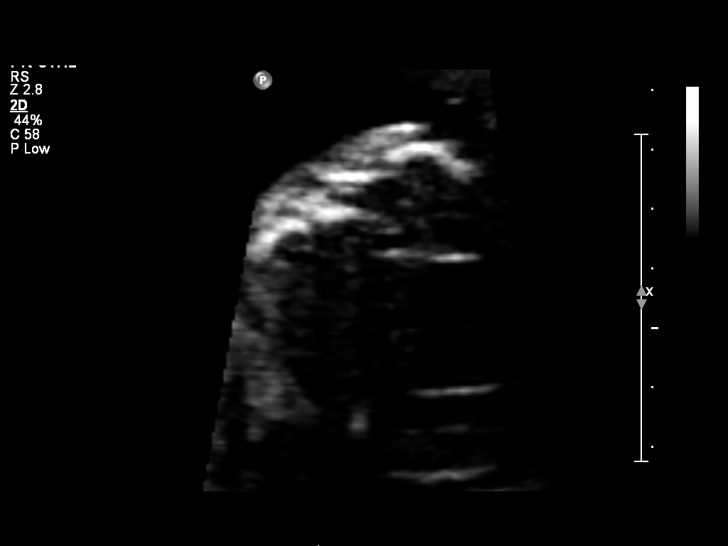
[im 32/55]
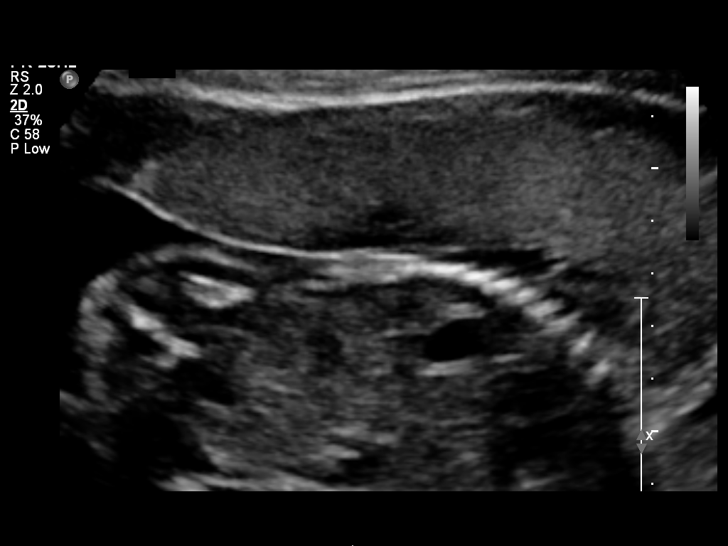
[im 40/55]
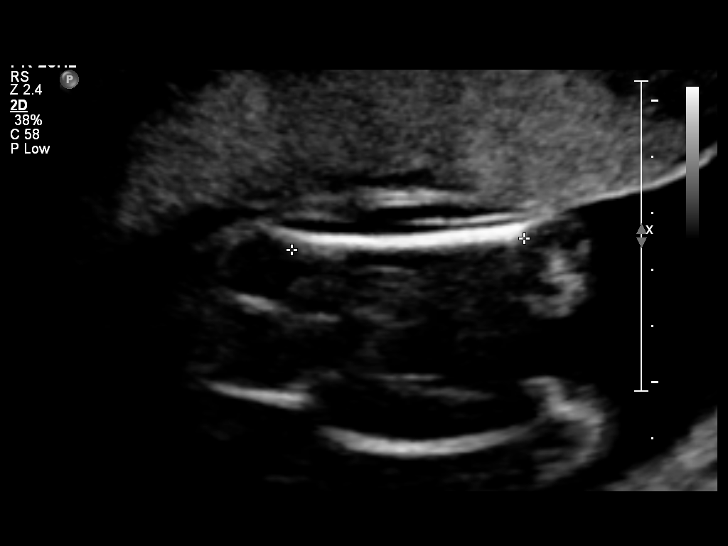
[im 46/55]
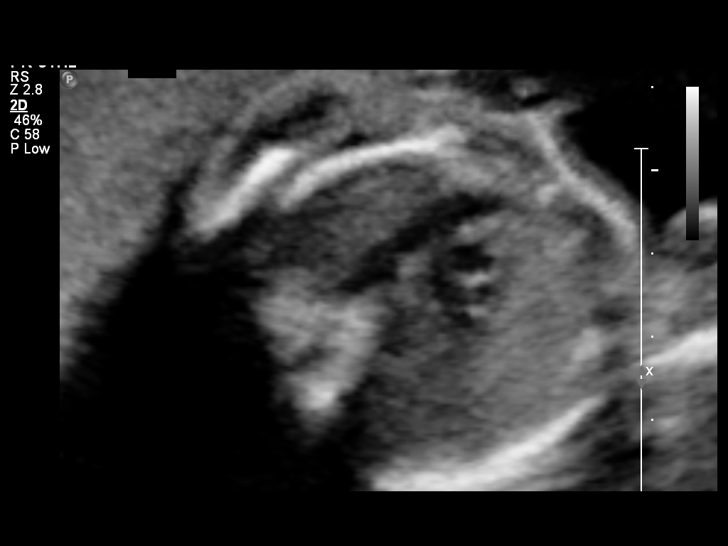
[im 52/55]
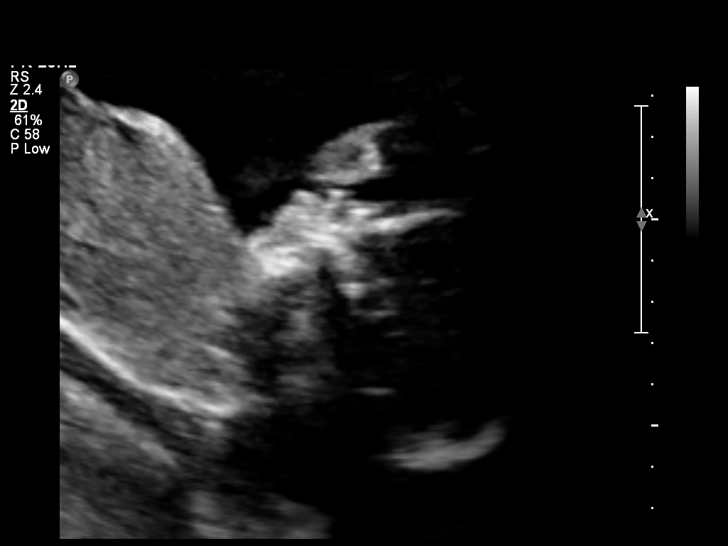

[12 of 28 positions shown; findings below may reference images not displayed]

OBSTETRICS REPORT
                      (Signed Final 09/16/2012 [DATE])

Service(s) Provided

 US OB FOLLOW UP                                       76816.1
Indications

 Follow-up incomplete fetal anatomic evaluation
 Maternal morbid obesity
Fetal Evaluation

 Num Of Fetuses:    1
 Fetal Heart Rate:  167                         bpm
 Cardiac Activity:  Observed
 Presentation:      Cephalic
 Placenta:          Anterior, above cervical os
 P. Cord            Visualized, central
 Insertion:

 Amniotic Fluid
 AFI FV:      Subjectively within normal limits
                                             Larg Pckt:     5.6  cm
Biometry

 BPD:     56.3  mm    G. Age:   23w 1d                CI:        71.16   70 - 86
                                                      FL/HC:      19.8   18.7 -

 HC:     212.6  mm    G. Age:   23w 3d        9  %    HC/AC:      1.17   1.05 -

 AC:     181.2  mm    G. Age:   22w 6d       12  %    FL/BPD:     74.6   71 - 87
 FL:        42  mm    G. Age:   23w 5d       24  %    FL/AC:      23.2   20 - 24

 Est. FW:     580  gm      1 lb 4 oz     33  %
Gestational Age

 LMP:           24w 1d       Date:   03/31/12                 EDD:   01/05/13
 U/S Today:     23w 2d                                        EDD:   01/11/13
 Best:          24w 1d    Det. By:   LMP  (03/31/12)          EDD:   01/05/13
Anatomy

 Cranium:          Appears normal         Aortic Arch:      Appears normal
 Fetal Cavum:      Appears normal         Ductal Arch:      Appears normal
 Ventricles:       Appears normal         Diaphragm:        Appears normal
 Choroid Plexus:   Previously seen        Stomach:          Appears normal, left
                                                            sided
 Cerebellum:       Previously seen        Abdomen:          Previously seen
 Posterior Fossa:  Previously seen        Abdominal Wall:   Previously seen
 Nuchal Fold:      Previously seen        Cord Vessels:     Previously seen
 Face:             Orbits appear          Kidneys:          Appear normal
                   normal
 Lips:             Previously seen        Bladder:          Appears normal
 Heart:            Not well visualized    Spine:            Appears normal
 RVOT:             Appears normal         Lower             Previously seen
                                          Extremities:
 LVOT:             Not well visualized    Upper             Previously seen
                                          Extremities:

 Other:  Male gender. Heels and 5th digit prev. visualized. Technically difficult
         due to maternal habitus and fetal position.
Targeted Anatomy

 Fetal Central Nervous System
 Lat. Ventricles:
Cervix Uterus Adnexa

 Cervical Length:   3.85      cm

 Cervix:       Normal appearance by transabdominal scan.
 Left Ovary:   Within normal limits.
 Right Ovary:  Within normal limits.
 Adnexa:     No abnormality visualized.
Impression

 Single live IUP in cephalic presentation.  Concordant
 measurements/assigned GA by LMP.
 4 chamber heart and LVOT remain not well visualized due to
 fetal position and maternal body habitus. No late-developing
 abnormality in visualized structures above.

 questions or concerns.

## 2014-07-10 ENCOUNTER — Emergency Department (HOSPITAL_COMMUNITY)
Admission: EM | Admit: 2014-07-10 | Discharge: 2014-07-10 | Disposition: A | Discharge status: Home / Self Care | Payer: Medicaid Other | Attending: Emergency Medicine | Admitting: Emergency Medicine

## 2014-07-10 ENCOUNTER — Emergency Department (HOSPITAL_COMMUNITY): Payer: Medicaid Other

## 2014-07-10 ENCOUNTER — Encounter (HOSPITAL_COMMUNITY): Payer: Self-pay | Admitting: Emergency Medicine

## 2014-07-10 DIAGNOSIS — S0990XA Unspecified injury of head, initial encounter: Secondary | ICD-10-CM | POA: Insufficient documentation

## 2014-07-10 DIAGNOSIS — S99929A Unspecified injury of unspecified foot, initial encounter: Secondary | ICD-10-CM

## 2014-07-10 DIAGNOSIS — S99919A Unspecified injury of unspecified ankle, initial encounter: Secondary | ICD-10-CM

## 2014-07-10 DIAGNOSIS — X500XXA Overexertion from strenuous movement or load, initial encounter: Secondary | ICD-10-CM | POA: Insufficient documentation

## 2014-07-10 DIAGNOSIS — W19XXXA Unspecified fall, initial encounter: Secondary | ICD-10-CM

## 2014-07-10 DIAGNOSIS — S8990XA Unspecified injury of unspecified lower leg, initial encounter: Secondary | ICD-10-CM | POA: Insufficient documentation

## 2014-07-10 DIAGNOSIS — Z8619 Personal history of other infectious and parasitic diseases: Secondary | ICD-10-CM | POA: Insufficient documentation

## 2014-07-10 DIAGNOSIS — Y929 Unspecified place or not applicable: Secondary | ICD-10-CM | POA: Insufficient documentation

## 2014-07-10 DIAGNOSIS — Z86011 Personal history of benign neoplasm of the brain: Secondary | ICD-10-CM | POA: Insufficient documentation

## 2014-07-10 DIAGNOSIS — W1809XA Striking against other object with subsequent fall, initial encounter: Secondary | ICD-10-CM | POA: Insufficient documentation

## 2014-07-10 DIAGNOSIS — S93402A Sprain of unspecified ligament of left ankle, initial encounter: Secondary | ICD-10-CM

## 2014-07-10 DIAGNOSIS — Y9389 Activity, other specified: Secondary | ICD-10-CM | POA: Insufficient documentation

## 2014-07-10 DIAGNOSIS — Z87891 Personal history of nicotine dependence: Secondary | ICD-10-CM | POA: Insufficient documentation

## 2014-07-10 DIAGNOSIS — S93409A Sprain of unspecified ligament of unspecified ankle, initial encounter: Secondary | ICD-10-CM | POA: Insufficient documentation

## 2014-07-10 MED ORDER — IBUPROFEN 800 MG PO TABS: 800.0000 mg | ORAL_TABLET | Freq: Three times a day (TID) | ORAL | Status: DC

## 2014-07-10 MED ORDER — OXYCODONE-ACETAMINOPHEN 5-325 MG PO TABS: 2.0000 | ORAL_TABLET | Freq: Four times a day (QID) | ORAL | Status: DC | PRN

## 2014-07-10 MED ORDER — IBUPROFEN 400 MG PO TABS
800.0000 mg | ORAL_TABLET | ORAL | Status: AC
  Administered 2014-07-10: 800 mg via ORAL
  Filled 2014-07-10: qty 2

## 2014-07-10 MED ORDER — OXYCODONE-ACETAMINOPHEN 5-325 MG PO TABS: 1.0000 | ORAL_TABLET | ORAL | Status: DC

## 2014-07-10 NOTE — ED Notes (Signed)
Paged Ortho Tech to apply cam walker for patient

## 2014-07-10 NOTE — ED Provider Notes (Signed)
CSN: [id number]     Arrival date & time 07/10/14  0006 History   First MD Initiated Contact with Patient 07/10/14 0018     Chief Complaint  Patient presents with  . Ankle Pain     (Consider location/radiation/quality/duration/timing/severity/associated sxs/prior Treatment) HPI Comments: Patient presents emergency department with chief complaint of fall. She states that she fell and rolled down a flight of stairs. She complains of pain in her left ankle. She also states that she hit her head, but states that this does not hurt anymore. She denies any LOC. Denies any neck pain, back pain, chest pain, abdominal pain, or pain in any other extremities. Denies any nausea or vomiting, denies any dizziness. She is tried taking Tylenol with minimal relief. The pain in the left ankle is moderate to severe. She is able to ambulate, but has to do so with a limp.  The history is provided by the patient. No language interpreter was used.    Past Medical History  Diagnosis Date  . No pertinent past medical history   . Brain tumor (benign)   . Gonorrhea   . Chlamydia    Past Surgical History  Procedure Laterality Date  . No past surgeries     Family History  Problem Relation Age of Onset  . Diabetes Paternal Grandfather   . Cancer Mother    History  Substance Use Topics  . Smoking status: Former Smoker    Types: Cigarettes    Quit date: 10/05/2010  . Smokeless tobacco: Not on file  . Alcohol Use: No   OB History   Grav Para Term Preterm Abortions TAB SAB Ect Mult Living   1 1 1       1      Review of Systems  Constitutional: Negative for fever and chills.  Respiratory: Negative for shortness of breath.   Cardiovascular: Negative for chest pain.  Gastrointestinal: Negative for nausea, vomiting, diarrhea and constipation.  Genitourinary: Negative for dysuria.  Musculoskeletal: Positive for arthralgias and joint swelling.      Allergies  Review of patient's allergies indicates no  known allergies.  Home Medications   Prior to Admission medications   Medication Sig Start Date End Date Taking? Authorizing Provider  ibuprofen (ADVIL,MOTRIN) 600 MG tablet Take 1 tablet (600 mg total) by mouth every 6 (six) hours. 01/11/13   Rosezella Rumpf, CNM   BP 128/79  Pulse 94  Temp(Src) 98.2 F (36.8 C)  Resp 18  SpO2 99%  LMP 07/09/2014  Breastfeeding? No Physical Exam  Nursing note and vitals reviewed. Constitutional: She is oriented to person, place, and time. She appears well-developed and well-nourished.  HENT:  Head: Normocephalic and atraumatic.  Eyes: Conjunctivae and EOM are normal. Pupils are equal, round, and reactive to light.  Neck: Normal range of motion. Neck supple.  Cardiovascular: Normal rate, regular rhythm and normal heart sounds.  Exam reveals no gallop and no friction rub.   No murmur heard. Intact distal pulses with brisk capillary refill  Pulmonary/Chest: Effort normal and breath sounds normal. No respiratory distress. She has no wheezes. She has no rales. She exhibits no tenderness.  Abdominal: Soft. She exhibits no distension and no mass. There is no tenderness. There is no rebound and no guarding.  No focal abdominal tenderness, no RLQ tenderness or pain at McBurney's point, no RUQ tenderness or Murphy's sign, no left-sided abdominal tenderness, no fluid wave, or signs of peritonitis   Musculoskeletal: Normal range of motion. She exhibits no  edema and no tenderness.  Left ankle tender to palpation over the ATFL, range of motion and strength limited secondary to pain  No pain in the left knee  Range of motion and strength the remaining joints and extremities is 5/5 throughout  Neurological: She is alert and oriented to person, place, and time.  Sensation intact  Skin: Skin is warm and dry.  Psychiatric: She has a normal mood and affect. Her behavior is normal. Judgment and thought content normal.    ED Course  Procedures (including  critical care time) Labs Review Labs Reviewed - No data to display  Imaging Review Dg Tibia/fibula Left  07/10/2014   CLINICAL DATA:  Left tib-fib pain and swelling after a fall.  EXAM: LEFT TIBIA AND FIBULA - 2 VIEW  COMPARISON:  None.  FINDINGS: There is no evidence of fracture or other focal bone lesions. Soft tissues are unremarkable.  IMPRESSION: Negative.   Electronically Signed   By: Lucienne Capers M.D.   On: 07/10/2014 00:54   Dg Ankle Complete Left  07/10/2014   CLINICAL DATA:  Left ankle pain and injury after a fall.  EXAM: LEFT ANKLE COMPLETE - 3+ VIEW  COMPARISON:  None.  FINDINGS: There is no evidence of fracture, dislocation, or joint effusion. There is no evidence of arthropathy or other focal bone abnormality. Soft tissues are unremarkable.  IMPRESSION: Negative.   Electronically Signed   By: Lucienne Capers M.D.   On: 07/10/2014 00:53     EKG Interpretation None      MDM   Final diagnoses:  Fall, initial encounter  Ankle sprain, left, initial encounter    Patient with mechanical fall. Suspicious for a left ankle sprain. No other serious injuries. Patient is ambulatory. Will check plain films, and will reassess. Anticipate discharge to home with crutches, and Cam Walker, and pain medicine. Will give orthopedic followup.  1:00 AM Plain films are negative. Will discharge per plan above. Will give ibuprofen and pain medicine.  1:22 AM Cam walker doesn't fit, will have ortho tech splint.    Montine Circle, PA-C 07/10/14 0122

## 2014-07-10 NOTE — ED Provider Notes (Signed)
Medical screening examination/treatment/procedure(s) were performed by non-physician practitioner and as supervising physician I was immediately available for consultation/collaboration.   EKG Interpretation None       Delice Bison Deyjah Kindel, DO 07/10/14 [phone]

## 2014-07-10 NOTE — Discharge Instructions (Signed)

## 2014-07-10 NOTE — ED Notes (Signed)
Pt fell down 12 steps. [place] forward, hit her head and twisted left ankle. Denies LOC, nausea, vomiting, or dizziness. Took tylenol with no relief. Left ankle and foot swollen and hot to touch. Pt able to wiggle toes, move left foot, and good pedal pulse and capillary refill.

## 2014-08-15 ENCOUNTER — Encounter (HOSPITAL_COMMUNITY): Payer: Self-pay | Admitting: Emergency Medicine

## 2015-09-26 DIAGNOSIS — E221 Hyperprolactinemia: Secondary | ICD-10-CM

## 2015-09-26 HISTORY — DX: Hyperprolactinemia: E22.1

## 2015-10-14 DIAGNOSIS — F909 Attention-deficit hyperactivity disorder, unspecified type: Secondary | ICD-10-CM | POA: Insufficient documentation

## 2016-01-15 DIAGNOSIS — E559 Vitamin D deficiency, unspecified: Secondary | ICD-10-CM | POA: Insufficient documentation

## 2016-09-07 ENCOUNTER — Inpatient Hospital Stay (HOSPITAL_COMMUNITY): Payer: Medicaid Other

## 2016-09-07 ENCOUNTER — Inpatient Hospital Stay (HOSPITAL_COMMUNITY)
Admission: AD | Admit: 2016-09-07 | Discharge: 2016-09-07 | Disposition: A | Discharge status: Home / Self Care | Payer: Medicaid Other | Source: Ambulatory Visit | Attending: Obstetrics and Gynecology | Admitting: Obstetrics and Gynecology

## 2016-09-07 ENCOUNTER — Encounter (HOSPITAL_COMMUNITY): Payer: Self-pay | Admitting: *Deleted

## 2016-09-07 DIAGNOSIS — Z833 Family history of diabetes mellitus: Secondary | ICD-10-CM | POA: Insufficient documentation

## 2016-09-07 DIAGNOSIS — Z809 Family history of malignant neoplasm, unspecified: Secondary | ICD-10-CM | POA: Diagnosis not present

## 2016-09-07 DIAGNOSIS — Z3491 Encounter for supervision of normal pregnancy, unspecified, first trimester: Secondary | ICD-10-CM

## 2016-09-07 DIAGNOSIS — O9989 Other specified diseases and conditions complicating pregnancy, childbirth and the puerperium: Secondary | ICD-10-CM | POA: Diagnosis not present

## 2016-09-07 DIAGNOSIS — R109 Unspecified abdominal pain: Secondary | ICD-10-CM

## 2016-09-07 DIAGNOSIS — D496 Neoplasm of unspecified behavior of brain: Secondary | ICD-10-CM | POA: Insufficient documentation

## 2016-09-07 DIAGNOSIS — Z87891 Personal history of nicotine dependence: Secondary | ICD-10-CM | POA: Insufficient documentation

## 2016-09-07 DIAGNOSIS — Z8619 Personal history of other infectious and parasitic diseases: Secondary | ICD-10-CM | POA: Diagnosis not present

## 2016-09-07 DIAGNOSIS — Z3201 Encounter for pregnancy test, result positive: Secondary | ICD-10-CM | POA: Insufficient documentation

## 2016-09-07 DIAGNOSIS — Z3A01 Less than 8 weeks gestation of pregnancy: Secondary | ICD-10-CM | POA: Diagnosis not present

## 2016-09-07 DIAGNOSIS — R1030 Lower abdominal pain, unspecified: Secondary | ICD-10-CM | POA: Insufficient documentation

## 2016-09-07 DIAGNOSIS — O468X1 Other antepartum hemorrhage, first trimester: Secondary | ICD-10-CM

## 2016-09-07 DIAGNOSIS — O26892 Other specified pregnancy related conditions, second trimester: Secondary | ICD-10-CM | POA: Insufficient documentation

## 2016-09-07 DIAGNOSIS — O208 Other hemorrhage in early pregnancy: Secondary | ICD-10-CM | POA: Diagnosis not present

## 2016-09-07 DIAGNOSIS — O26899 Other specified pregnancy related conditions, unspecified trimester: Secondary | ICD-10-CM

## 2016-09-07 DIAGNOSIS — O418X10 Other specified disorders of amniotic fluid and membranes, first trimester, not applicable or unspecified: Secondary | ICD-10-CM

## 2016-09-07 LAB — CBC
HEMATOCRIT: 34.3 % — AB (ref 36.0–46.0)
Hemoglobin: 12 g/dL (ref 12.0–15.0)
MCH: 29.7 pg (ref 26.0–34.0)
MCHC: 35 g/dL (ref 30.0–36.0)
MCV: 84.9 fL (ref 78.0–100.0)
PLATELETS: 209 10*3/uL (ref 150–400)
RBC: 4.04 MIL/uL (ref 3.87–5.11)
RDW: 12.6 % (ref 11.5–15.5)
WBC: 4.5 10*3/uL (ref 4.0–10.5)

## 2016-09-07 LAB — POCT PREGNANCY, URINE: Preg Test, Ur: POSITIVE — AB

## 2016-09-07 LAB — URINALYSIS, ROUTINE W REFLEX MICROSCOPIC
BILIRUBIN URINE: NEGATIVE
Glucose, UA: NEGATIVE mg/dL
Hgb urine dipstick: NEGATIVE
Ketones, ur: NEGATIVE mg/dL
Leukocytes, UA: NEGATIVE
NITRITE: NEGATIVE
PROTEIN: NEGATIVE mg/dL
SPECIFIC GRAVITY, URINE: 1.015 (ref 1.005–1.030)
pH: 7.5 (ref 5.0–8.0)

## 2016-09-07 LAB — WET PREP, GENITAL
CLUE CELLS WET PREP: NONE SEEN
Sperm: NONE SEEN
TRICH WET PREP: NONE SEEN
Yeast Wet Prep HPF POC: NONE SEEN

## 2016-09-07 LAB — HCG, QUANTITATIVE, PREGNANCY: hCG, Beta Chain, Quant, S: 27929 m[IU]/mL — ABNORMAL HIGH (ref <5)

## 2016-09-07 NOTE — Discharge Instructions (Signed)
Abdominal Pain During Pregnancy Belly (abdominal) pain is common during pregnancy. Most of the time, it is not a serious problem. Other times, it can be a sign that something is wrong with the pregnancy. Always tell your doctor if you have belly pain. Follow these instructions at home: Monitor your belly pain for any changes. The following actions may help you feel better:  Do not have sex (intercourse) or put anything in your vagina until you feel better.  Rest until your pain stops.  Drink clear fluids if you feel sick to your stomach (nauseous). Do not eat solid food until you feel better.  Only take medicine as told by your doctor.  Keep all doctor visits as told. Get help right away if:  You are bleeding, leaking fluid, or pieces of tissue come out of your vagina.  You have more pain or cramping.  You keep throwing up (vomiting).  You have pain when you pee (urinate) or have blood in your pee.  You have a fever.  You do not feel your baby moving as much.  You feel very weak or feel like passing out.  You have trouble breathing, with or without belly pain.  You have a very bad headache and belly pain.  You have fluid leaking from your vagina and belly pain.  You keep having watery poop (diarrhea).  Your belly pain does not go away after resting, or the pain gets worse. This information is not intended to replace advice given to you by your health care provider. Make sure you discuss any questions you have with your health care provider. Document Released: 09/18/2009 Document Revised: 05/08/2016 Document Reviewed: 04/29/2013 Elsevier Interactive Patient Education  2017 [place] Hematoma A subchorionic hematoma is a gathering of blood between the outer wall of the placenta and the inner wall of the womb (uterus). The placenta is the organ that connects the fetus to the wall of the uterus. The placenta performs the feeding, breathing (oxygen to the  fetus), and waste removal (excretory work) of the fetus.  Subchorionic hematoma is the most common abnormality found on a result from ultrasonography done during the first trimester or early second trimester of pregnancy. If there has been little or no vaginal bleeding, early small hematomas usually shrink on their own and do not affect your baby or pregnancy. The blood is gradually absorbed over 1-2 weeks. When bleeding starts later in pregnancy or the hematoma is larger or occurs in an older pregnant woman, the outcome may not be as good. Larger hematomas may get bigger, which increases the chances for miscarriage. Subchorionic hematoma also increases the risk of premature detachment of the placenta from the uterus, preterm (premature) labor, and stillbirth. HOME CARE INSTRUCTIONS  Stay on bed rest if your health care provider recommends this. Although bed rest will not prevent more bleeding or prevent a miscarriage, your health care provider may recommend bed rest until you are advised otherwise.  Avoid heavy lifting (more than 10 lb [4.5 kg]), exercise, sexual intercourse, or douching as directed by your health care provider.  Keep track of the number of pads you use each day and how soaked (saturated) they are. Write down this information.  Do not use tampons.  Keep all follow-up appointments as directed by your health care provider. Your health care provider may ask you to have follow-up blood tests or ultrasound tests or both. SEEK IMMEDIATE MEDICAL CARE IF:  You have severe cramps in your stomach, back,  abdomen, or pelvis.  You have a fever.  You pass large clots or tissue. Save any tissue for your health care provider to look at.  Your bleeding increases or you become lightheaded, feel weak, or have fainting episodes. This information is not intended to replace advice given to you by your health care provider. Make sure you discuss any questions you have with your health care  provider. Document Released: 01/15/2007 Document Revised: 10/21/2014 Document Reviewed: 04/29/2013 Elsevier Interactive Patient Education  2017 [employer].

## 2016-09-07 NOTE — MAU Provider Note (Signed)
History     CSN: [id number]  Arrival date and time: 09/07/16 1646   First Provider Initiated Contact with Patient 09/07/16 1730      Chief Complaint  Patient presents with  . Nausea  . Abdominal Pain  . Possible Pregnancy   HPI Maria Morgan is a 25 y.o. G2P1001 at [redacted]w[redacted]d by LMP who presents with abdominal pain & nausea. Had positive pregnancy test last week. Endorses intermittent lower abdominal pain that is sharp & lasts for a few seconds at a times. Pain occurs sporadically; last felt pain 30 minutes ago. Currently no pain; rates pain 6/10 when it comes. Has not treated.  Nausea for the last week. Vomited once today. Denies fever/chills, vaginal bleeding, vaginal discharge, diarrhea, constipation.   OB History    Gravida Para Term Preterm AB Living   2 1 1     1    SAB TAB Ectopic Multiple Live Births           1      Past Medical History:  Diagnosis Date  . Brain tumor (benign) ([place])   . Chlamydia   . Gonorrhea   . No pertinent past medical history     Past Surgical History:  Procedure Laterality Date  . NO PAST SURGERIES      Family History  Problem Relation Age of Onset  . Cancer Mother   . Diabetes Paternal Grandfather     Social History  Substance Use Topics  . Smoking status: Former Smoker    Types: Cigarettes    Quit date: 10/05/2010  . Smokeless tobacco: Never Used  . Alcohol use No    Allergies: No Known Allergies  Prescriptions Prior to Admission  Medication Sig Dispense Refill Last Dose  . ibuprofen (ADVIL,MOTRIN) 600 MG tablet Take 1 tablet (600 mg total) by mouth every 6 (six) hours. 30 tablet 0   . ibuprofen (ADVIL,MOTRIN) 800 MG tablet Take 1 tablet (800 mg total) by mouth 3 (three) times daily. 21 tablet 0   . oxyCODONE-acetaminophen (PERCOCET/ROXICET) 5-325 MG per tablet Take 2 tablets by mouth every 6 (six) hours as needed for severe pain. 15 tablet 0     Review of Systems  Constitutional: Negative.   Gastrointestinal: Positive  for abdominal pain, nausea and vomiting. Negative for constipation and diarrhea.  Genitourinary: Negative.    Physical Exam   Blood pressure 142/83, pulse 90, temperature 98.6 F (37 C), temperature source Oral, resp. rate 16, weight 293 lb 9.6 oz (133.2 kg), last menstrual period 07/18/2016.  Physical Exam  Nursing note and vitals reviewed. Constitutional: She is oriented to person, place, and time. She appears well-developed and well-nourished. No distress.  HENT:  Head: Normocephalic and atraumatic.  Eyes: Conjunctivae are normal. Right eye exhibits no discharge. Left eye exhibits no discharge. No scleral icterus.  Neck: Normal range of motion.  Cardiovascular: Normal rate, regular rhythm and normal heart sounds.   No murmur heard. Respiratory: Effort normal and breath sounds normal. No respiratory distress. She has no wheezes.  GI: Soft. Bowel sounds are normal. She exhibits no distension. There is no tenderness. There is no rebound and no guarding.  Neurological: She is alert and oriented to person, place, and time.  Skin: Skin is warm and dry. She is not diaphoretic.  Psychiatric: She has a normal mood and affect. Her behavior is normal. Judgment and thought content normal.    MAU Course  Procedures Results for orders placed or performed during the hospital encounter of 09/07/16 (  from the past 24 hour(s))  Urinalysis, Routine w reflex microscopic (not at [facility])     Status: None   Collection Time: 09/07/16  4:59 PM  Result Value Ref Range   Color, Urine YELLOW YELLOW   APPearance CLEAR CLEAR   Specific Gravity, Urine 1.015 1.005 - 1.030   pH 7.5 5.0 - 8.0   Glucose, UA NEGATIVE NEGATIVE mg/dL   Hgb urine dipstick NEGATIVE NEGATIVE   Bilirubin Urine NEGATIVE NEGATIVE   Ketones, ur NEGATIVE NEGATIVE mg/dL   Protein, ur NEGATIVE NEGATIVE mg/dL   Nitrite NEGATIVE NEGATIVE   Leukocytes, UA NEGATIVE NEGATIVE  Pregnancy, urine POC     Status: Abnormal   Collection Time:  09/07/16  5:04 PM  Result Value Ref Range   Preg Test, Ur POSITIVE (A) NEGATIVE  Wet prep, genital     Status: Abnormal   Collection Time: 09/07/16  5:59 PM  Result Value Ref Range   Yeast Wet Prep HPF POC NONE SEEN NONE SEEN   Trich, Wet Prep NONE SEEN NONE SEEN   Clue Cells Wet Prep HPF POC NONE SEEN NONE SEEN   WBC, Wet Prep HPF POC FEW (A) NONE SEEN   Sperm NONE SEEN   CBC     Status: Abnormal   Collection Time: 09/07/16  6:06 PM  Result Value Ref Range   WBC 4.5 4.0 - 10.5 K/uL   RBC 4.04 3.87 - 5.11 MIL/uL   Hemoglobin 12.0 12.0 - 15.0 g/dL   HCT 34.3 (L) 36.0 - 46.0 %   MCV 84.9 78.0 - 100.0 fL   MCH 29.7 26.0 - 34.0 pg   MCHC 35.0 30.0 - 36.0 g/dL   RDW 12.6 11.5 - 15.5 %   Platelets 209 150 - 400 K/uL  hCG, quantitative, pregnancy     Status: Abnormal   Collection Time: 09/07/16  6:07 PM  Result Value Ref Range   hCG, Beta Chain, Quant, S 27,929 (H) <5 mIU/mL   US Ob Comp Less 14 Wks  Result Date: 09/07/2016 CLINICAL DATA:  Pregnant patient with abdominal pain and pressure. EXAM: OBSTETRIC <14 WK [place] AND TRANSVAGINAL OB US TECHNIQUE: Both transabdominal and transvaginal ultrasound examinations were performed for complete evaluation of the gestation as well as the maternal uterus, adnexal regions, and pelvic cul-de-sac. Transvaginal technique was performed to assess early pregnancy. COMPARISON:  Pelvic ultrasound 09/16/2012. FINDINGS: Intrauterine gestational sac: Single Yolk sac:  Visualized. Embryo:  Visualized. Cardiac Activity: Visualized. Heart Rate: 117  bpm CRL:  3.6  mm   6 w   0 d                  [place] EDC: 05/02/2017 Subchorionic hemorrhage:  Small Maternal uterus/adnexae: Normal right and left ovaries. No free fluid in the pelvis. IMPRESSION: Single live intrauterine gestation.  Small subchorionic hemorrhage. Electronically Signed   By: Lovey Newcomer M.D.   On: 09/07/2016 19:01   US Ob Transvaginal  Result Date: 09/07/2016 CLINICAL DATA:  Pregnant patient with  abdominal pain and pressure. EXAM: OBSTETRIC <14 WK [place] AND TRANSVAGINAL OB US TECHNIQUE: Both transabdominal and transvaginal ultrasound examinations were performed for complete evaluation of the gestation as well as the maternal uterus, adnexal regions, and pelvic cul-de-sac. Transvaginal technique was performed to assess early pregnancy. COMPARISON:  Pelvic ultrasound 09/16/2012. FINDINGS: Intrauterine gestational sac: Single Yolk sac:  Visualized. Embryo:  Visualized. Cardiac Activity: Visualized. Heart Rate: 117  bpm CRL:  3.6  mm   6 w   0  d                  [place] EDC: 05/02/2017 Subchorionic hemorrhage:  Small Maternal uterus/adnexae: Normal right and left ovaries. No free fluid in the pelvis. IMPRESSION: Single live intrauterine gestation.  Small subchorionic hemorrhage. Electronically Signed   By: Lovey Newcomer M.D.   On: 09/07/2016 19:01    MDM +UPT UA, wet prep, GC/chlamydia, CBC, ABO/Rh, quant hCG, HIV, and [place] today to rule out ectopic pregnancy A positive Ultrasound show SIUP with cardiac activity measuring [redacted]w[redacted]d & small [place]  Assessment and Plan  A: 1. Normal IUP (intrauterine pregnancy) on prenatal ultrasound, first trimester   2. Abdominal pain affecting pregnancy   3. Subchorionic hematoma in first trimester, single or unspecified fetus    P: Discharge home GC/CT pending Pregnancy verification letter & list of providers given Start prenatal care Discussed reasons to return to [place] 09/07/2016, 5:29 PM

## 2016-09-07 NOTE — MAU Note (Signed)
2 +HPT on Thanksgiving.  Been feeling a little pressure.  Has been sick this week, period is late.  Back is killing her.

## 2016-09-08 LAB — HIV ANTIBODY (ROUTINE TESTING W REFLEX): HIV SCREEN 4TH GENERATION: NONREACTIVE

## 2016-09-11 LAB — GC/CHLAMYDIA PROBE AMP (~~LOC~~) NOT AT ARMC
CHLAMYDIA, DNA PROBE: NEGATIVE
Neisseria Gonorrhea: NEGATIVE

## 2016-10-10 ENCOUNTER — Encounter: Payer: Self-pay | Admitting: Certified Nurse Midwife

## 2016-10-10 ENCOUNTER — Other Ambulatory Visit (HOSPITAL_COMMUNITY)
Admission: RE | Admit: 2016-10-10 | Discharge: 2016-10-10 | Disposition: A | Discharge status: Home / Self Care | Payer: Medicaid Other | Source: Ambulatory Visit | Attending: Obstetrics | Admitting: Obstetrics

## 2016-10-10 ENCOUNTER — Ambulatory Visit (INDEPENDENT_AMBULATORY_CARE_PROVIDER_SITE_OTHER): Payer: Medicaid Other | Admitting: Certified Nurse Midwife

## 2016-10-10 VITALS — BP 115/76 | HR 86 | Wt 284.0 lb

## 2016-10-10 DIAGNOSIS — D352 Benign neoplasm of pituitary gland: Secondary | ICD-10-CM | POA: Diagnosis not present

## 2016-10-10 DIAGNOSIS — Z01419 Encounter for gynecological examination (general) (routine) without abnormal findings: Secondary | ICD-10-CM | POA: Diagnosis present

## 2016-10-10 DIAGNOSIS — Z6841 Body Mass Index (BMI) 40.0 and over, adult: Secondary | ICD-10-CM

## 2016-10-10 DIAGNOSIS — E669 Obesity, unspecified: Secondary | ICD-10-CM | POA: Insufficient documentation

## 2016-10-10 DIAGNOSIS — D126 Benign neoplasm of colon, unspecified: Secondary | ICD-10-CM | POA: Diagnosis not present

## 2016-10-10 DIAGNOSIS — Z3481 Encounter for supervision of other normal pregnancy, first trimester: Secondary | ICD-10-CM

## 2016-10-10 DIAGNOSIS — Z3480 Encounter for supervision of other normal pregnancy, unspecified trimester: Secondary | ICD-10-CM

## 2016-10-10 DIAGNOSIS — IMO0001 Reserved for inherently not codable concepts without codable children: Secondary | ICD-10-CM

## 2016-10-10 NOTE — Progress Notes (Signed)
Subjective:    Maria Morgan is being seen today for her first obstetrical visit.  This is a planned pregnancy. She is at [redacted]w[redacted]d gestation. Her obstetrical history is significant for obesity and pituitary tumor, has seen endocinology in [place] and endocinology is aware of pregnancy, took bromocriptine after last pregnancy; polyps multiple colon and diverticulum benign. Relationship with FOB: significant other, living together. Patient does intend to breast feed. Pregnancy history fully reviewed.  The information documented in the HPI was reviewed and verified.  Menstrual History: OB History    Gravida Para Term Preterm AB Living   2 1 1     1    SAB TAB Ectopic Multiple Live Births           1       Patient's last menstrual period was 07/18/2016.    Past Medical History:  Diagnosis Date  . Brain tumor (benign) ([place])   . Chlamydia   . Gonorrhea     Past Surgical History:  Procedure Laterality Date  . NO PAST SURGERIES       (Not in a hospital admission) Allergies  Allergen Reactions  . Other Other (See Comments)    Experienced sensation of "throbbing" in throat and mild SOB. Resolved without medication.    Social History  Substance Use Topics  . Smoking status: Former Smoker    Types: Cigarettes    Quit date: 10/05/2010  . Smokeless tobacco: Never Used  . Alcohol use No    Family History  Problem Relation Age of Onset  . Cancer Mother   . Diabetes Paternal Grandfather      Review of Systems Constitutional: negative for weight loss Gastrointestinal: negative for vomiting Genitourinary:negative for genital lesions and vaginal discharge and dysuria Musculoskeletal:negative for back pain Behavioral/Psych: negative for abusive relationship, depression, illegal drug usage and tobacco use    Objective:    BP 115/76   Pulse 86   Wt 284 lb (128.8 kg)   LMP 07/18/2016   BMI 44.48 kg/m  General Appearance:    Alert, cooperative, no distress, appears stated age   Head:    Normocephalic, without obvious abnormality, atraumatic  Eyes:    PERRL, conjunctiva/corneas clear, EOM's intact, fundi    benign, both eyes  Ears:    Normal TM's and external ear canals, both ears  Nose:   Nares normal, septum midline, mucosa normal, no drainage    or sinus tenderness  Throat:   Lips, mucosa, and tongue normal; teeth and gums normal  Neck:   Supple, symmetrical, trachea midline, no adenopathy;    thyroid:  no enlargement/tenderness/nodules; no carotid   bruit or JVD  Back:     Symmetric, no curvature, ROM normal, no CVA tenderness  Lungs:     Clear to auscultation bilaterally, respirations unlabored  Chest Wall:    No tenderness or deformity   Heart:    Regular rate and rhythm, S1 and S2 normal, no murmur, rub   or gallop  Breast Exam:    No tenderness, masses, or nipple abnormality  Abdomen:     Soft, non-tender, bowel sounds active all four quadrants,    no masses, no organomegaly  Genitalia:    Normal female without lesion, discharge or tenderness  Extremities:   Extremities normal, atraumatic, no cyanosis or edema  Pulses:   2+ and symmetric all extremities  Skin:   Skin color, texture, turgor normal, no rashes or lesions  Lymph nodes:   Cervical, supraclavicular, and axillary nodes  normal  Neurologic:   CNII-XII intact, normal strength, sensation and reflexes    throughout      Lab Review Urine pregnancy test Labs reviewed yes Radiologic studies reviewed yes Assessment:    Pregnancy at [redacted]w[redacted]d weeks   Obese  H/O pituitary tumor  H/O polyps in colon/duodenum  Plan:      Prenatal vitamins.  Counseling provided regarding continued use of seat belts, cessation of alcohol consumption, smoking or use of illicit drugs; infection precautions i.e., influenza/TDAP immunizations, toxoplasmosis,CMV, parvovirus, listeria and varicella; workplace safety, exercise during pregnancy; routine dental care, safe medications, sexual activity, hot tubs, saunas,  pools, travel, caffeine use, fish and methlymercury, potential toxins, hair treatments, varicose veins Weight gain recommendations per IOM guidelines reviewed: underweight/BMI< 18.5--> gain 28 - 40 lbs; normal weight/BMI 18.5 - 24.9--> gain 25 - 35 lbs; overweight/BMI 25 - 29.9--> gain 15 - 25 lbs; obese/BMI >30->gain  11 - 20 lbs Problem list reviewed and updated. FIRST/CF mutation testing/NIPT/QUAD SCREEN/fragile X/Ashkenazi Jewish population testing/Spinal muscular atrophy discussed: ordered. Role of ultrasound in pregnancy discussed; fetal survey: requested. Amniocentesis discussed: not indicated. VBAC calculator score: VBAC consent form provided No orders of the defined types were placed in this encounter.  Orders Placed This Encounter  Procedures  . Culture, OB Urine  . Hemoglobinopathy evaluation  . Varicella zoster antibody, IgG  . VITAMIN D 25 Hydroxy (Vit-D Deficiency, Fractures)  . Cystic Fibrosis Mutation 97  . Obstetric Panel, Including HIV  . ToxASSURE Select 13 (MW), Urine  . MaterniT21 PLUS Core+SCA    Order Specific Question:   Is the patient insulin dependent?    Answer:   No    Order Specific Question:   Please enter gestational age. This should be expressed as weeks AND days, i.e. 16w 6d. Enter weeks here. Enter days in next question.    Answer:   54    Order Specific Question:   Please enter gestational age. This should be expressed as weeks AND days, i.e. 16w 6d. Enter days here. Enter weeks in previous question.    Answer:   5    Order Specific Question:   How was gestational age calculated?    Answer:   Ultrasound    Order Specific Question:   Please give the date of LMP OR Ultrasound OR Estimated date of delivery.    Answer:   05/03/2017    Order Specific Question:   Number of Fetuses (Type of Pregnancy):    Answer:   1    Order Specific Question:   Indications for performing the test? (please choose all that apply):    Answer:   Routine screening    Order  Specific Question:   Other Indications? (Y=Yes, N=No)    Answer:   N    Order Specific Question:   If this is a repeat specimen, please indicate the reason:    Answer:   Not indicated    Order Specific Question:   Please specify the patient's race: (C=White/Caucasion, B=Black, I=Native American, A=Asian, H=Hispanic, O=Other, U=Unknown)    Answer:   B    Order Specific Question:   Donor Egg - indicate if the egg was obtained from in vitro fertilization.    Answer:   N    Order Specific Question:   Age of Egg Donor.    Answer:   40    Order Specific Question:   Prior Down Syndrome/ONTD screening during current pregnancy.    Answer:   N  Order Specific Question:   Prior First Trimester Testing    Answer:   N    Order Specific Question:   Prior Second Trimester Testing    Answer:   N    Order Specific Question:   Family History of Neural Tube Defects    Answer:   N    Order Specific Question:   Prior Pregnancy with Down Syndrome    Answer:   N    Order Specific Question:   Please give the patient's weight (in pounds)    Answer:   284  . Hemoglobin A1c    Follow up in 4 weeks. 50% of 35 min visit spent on counseling and coordination of care.

## 2016-10-10 NOTE — Progress Notes (Signed)
Review u/s for dating and pt states something seen on u/s. Pt states she has a small spot on her brain, has increased prolactin level.  Pt states that she is seeing Neurology in [facility].

## 2016-10-13 LAB — NUSWAB VG+, CANDIDA 6SP
CANDIDA GLABRATA, NAA: NEGATIVE
CANDIDA KRUSEI, NAA: NEGATIVE
CANDIDA LUSITANIAE, NAA: NEGATIVE
CANDIDA PARAPSILOSIS, NAA: NEGATIVE
Candida albicans, NAA: NEGATIVE
Candida tropicalis, NAA: NEGATIVE
Chlamydia trachomatis, NAA: POSITIVE — AB
NEISSERIA GONORRHOEAE, NAA: NEGATIVE
Trich vag by NAA: NEGATIVE

## 2016-10-13 LAB — URINE CULTURE, OB REFLEX

## 2016-10-13 LAB — CULTURE, OB URINE

## 2016-10-16 LAB — CYTOLOGY - PAP: DIAGNOSIS: NEGATIVE

## 2016-10-17 ENCOUNTER — Other Ambulatory Visit: Payer: Self-pay | Admitting: Certified Nurse Midwife

## 2016-10-17 DIAGNOSIS — A749 Chlamydial infection, unspecified: Secondary | ICD-10-CM

## 2016-10-17 DIAGNOSIS — O98811 Other maternal infectious and parasitic diseases complicating pregnancy, first trimester: Principal | ICD-10-CM

## 2016-10-17 MED ORDER — AZITHROMYCIN 250 MG PO TABS: ORAL_TABLET | ORAL | 0 refills | Status: DC

## 2016-10-18 LAB — OBSTETRIC PANEL, INCLUDING HIV
Antibody Screen: NEGATIVE
Basophils Absolute: 0 10*3/uL (ref 0.0–0.2)
Basos: 0 %
EOS (ABSOLUTE): 0.1 10*3/uL (ref 0.0–0.4)
EOS: 1 %
HEMOGLOBIN: 12.5 g/dL (ref 11.1–15.9)
HEP B S AG: NEGATIVE
HIV Screen 4th Generation wRfx: NONREACTIVE
Hematocrit: 36.6 % (ref 34.0–46.6)
IMMATURE GRANULOCYTES: 0 %
Immature Grans (Abs): 0 10*3/uL (ref 0.0–0.1)
LYMPHS ABS: 1.3 10*3/uL (ref 0.7–3.1)
Lymphs: 28 %
MCH: 29.6 pg (ref 26.6–33.0)
MCHC: 34.2 g/dL (ref 31.5–35.7)
MCV: 87 fL (ref 79–97)
Monocytes Absolute: 0.5 10*3/uL (ref 0.1–0.9)
Monocytes: 11 %
NEUTROS PCT: 60 %
Neutrophils Absolute: 2.9 10*3/uL (ref 1.4–7.0)
Platelets: 237 10*3/uL (ref 150–379)
RBC: 4.22 x10E6/uL (ref 3.77–5.28)
RDW: 13.6 % (ref 12.3–15.4)
RH TYPE: POSITIVE
RPR: NONREACTIVE
Rubella Antibodies, IGG: 3.16 index (ref >0.99)
WBC: 4.8 10*3/uL (ref 3.4–10.8)

## 2016-10-18 LAB — HEMOGLOBINOPATHY EVALUATION
HEMOGLOBIN F QUANTITATION: 0 % (ref 0.0–2.0)
HGB A: 97 % (ref 96.4–98.8)
HGB C: 0 %
HGB S: 0 %
Hemoglobin A2 Quantitation: 3 % (ref 1.8–3.2)

## 2016-10-18 LAB — VARICELLA ZOSTER ANTIBODY, IGG: Varicella zoster IgG: 272 index (ref >165)

## 2016-10-18 LAB — CYSTIC FIBROSIS MUTATION 97: Interpretation: NOT DETECTED

## 2016-10-18 LAB — TOXASSURE SELECT 13 (MW), URINE

## 2016-10-18 LAB — VITAMIN D 25 HYDROXY (VIT D DEFICIENCY, FRACTURES): Vit D, 25-Hydroxy: 17.9 ng/mL — ABNORMAL LOW (ref 30.0–100.0)

## 2016-10-18 LAB — HEMOGLOBIN A1C
ESTIMATED AVERAGE GLUCOSE: 105 mg/dL
Hgb A1c MFr Bld: 5.3 % (ref 4.8–5.6)

## 2016-10-21 ENCOUNTER — Other Ambulatory Visit: Payer: Self-pay | Admitting: Certified Nurse Midwife

## 2016-10-21 DIAGNOSIS — R7989 Other specified abnormal findings of blood chemistry: Secondary | ICD-10-CM | POA: Insufficient documentation

## 2016-10-21 MED ORDER — VITAMIN D (ERGOCALCIFEROL) 1.25 MG (50000 UNIT) PO CAPS: 50000.0000 [IU] | ORAL_CAPSULE | ORAL | 2 refills | Status: DC

## 2016-10-22 ENCOUNTER — Inpatient Hospital Stay (HOSPITAL_COMMUNITY)
Admission: AD | Admit: 2016-10-22 | Discharge: 2016-10-23 | Disposition: A | Discharge status: Home / Self Care | Payer: Medicaid Other | Source: Ambulatory Visit | Attending: Obstetrics & Gynecology | Admitting: Obstetrics & Gynecology

## 2016-10-22 ENCOUNTER — Encounter (HOSPITAL_COMMUNITY): Payer: Self-pay

## 2016-10-22 DIAGNOSIS — Y92410 Unspecified street and highway as the place of occurrence of the external cause: Secondary | ICD-10-CM | POA: Insufficient documentation

## 2016-10-22 DIAGNOSIS — M549 Dorsalgia, unspecified: Secondary | ICD-10-CM

## 2016-10-22 DIAGNOSIS — Z9109 Other allergy status, other than to drugs and biological substances: Secondary | ICD-10-CM | POA: Insufficient documentation

## 2016-10-22 DIAGNOSIS — R1030 Lower abdominal pain, unspecified: Secondary | ICD-10-CM | POA: Insufficient documentation

## 2016-10-22 DIAGNOSIS — Z3A12 12 weeks gestation of pregnancy: Secondary | ICD-10-CM | POA: Insufficient documentation

## 2016-10-22 DIAGNOSIS — O9A211 Injury, poisoning and certain other consequences of external causes complicating pregnancy, first trimester: Secondary | ICD-10-CM

## 2016-10-22 DIAGNOSIS — Z87891 Personal history of nicotine dependence: Secondary | ICD-10-CM | POA: Insufficient documentation

## 2016-10-22 DIAGNOSIS — M545 Low back pain: Secondary | ICD-10-CM | POA: Insufficient documentation

## 2016-10-22 DIAGNOSIS — Z8619 Personal history of other infectious and parasitic diseases: Secondary | ICD-10-CM | POA: Insufficient documentation

## 2016-10-22 DIAGNOSIS — O26891 Other specified pregnancy related conditions, first trimester: Secondary | ICD-10-CM | POA: Insufficient documentation
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
## 2016-10-23 DIAGNOSIS — Z3A12 12 weeks gestation of pregnancy: Secondary | ICD-10-CM | POA: Diagnosis not present

## 2016-10-23 DIAGNOSIS — O9A211 Injury, poisoning and certain other consequences of external causes complicating pregnancy, first trimester: Secondary | ICD-10-CM | POA: Diagnosis not present

## 2016-10-23 DIAGNOSIS — Y92410 Unspecified street and highway as the place of occurrence of the external cause: Secondary | ICD-10-CM | POA: Diagnosis not present

## 2016-10-23 DIAGNOSIS — M549 Dorsalgia, unspecified: Secondary | ICD-10-CM | POA: Diagnosis not present

## 2016-10-23 DIAGNOSIS — Z9109 Other allergy status, other than to drugs and biological substances: Secondary | ICD-10-CM | POA: Diagnosis not present

## 2016-10-23 DIAGNOSIS — Z8619 Personal history of other infectious and parasitic diseases: Secondary | ICD-10-CM | POA: Diagnosis not present

## 2016-10-23 DIAGNOSIS — M545 Low back pain: Secondary | ICD-10-CM | POA: Diagnosis not present

## 2016-10-23 DIAGNOSIS — O26891 Other specified pregnancy related conditions, first trimester: Secondary | ICD-10-CM | POA: Diagnosis not present

## 2016-10-23 DIAGNOSIS — Z87891 Personal history of nicotine dependence: Secondary | ICD-10-CM | POA: Diagnosis not present

## 2016-10-23 DIAGNOSIS — R1030 Lower abdominal pain, unspecified: Secondary | ICD-10-CM | POA: Diagnosis not present

## 2016-10-23 MED ORDER — IBUPROFEN 600 MG PO TABS
600.0000 mg | ORAL_TABLET | Freq: Once | ORAL | Status: AC
  Administered 2016-10-23: 600 mg via ORAL
  Filled 2016-10-23: qty 1

## 2016-10-23 MED ORDER — CONCEPT OB 130-92.4-1 MG PO CAPS: 1.0000 | ORAL_CAPSULE | Freq: Every day | ORAL | 12 refills | Status: DC

## 2016-10-23 MED ORDER — CYCLOBENZAPRINE HCL 10 MG PO TABS: 5.0000 mg | ORAL_TABLET | Freq: Three times a day (TID) | ORAL | 0 refills | Status: DC | PRN
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
## 2016-10-23 NOTE — Discharge Instructions (Signed)
What Do I Need to Know About Injuries During Pregnancy? Injuries can happen during pregnancy. Minor falls and accidents usually do not harm you or your baby. However, any injury should be reported to your doctor. What can I do to protect myself from injuries?  Remove rugs and loose objects on the floor.  Wear comfortable shoes that have a good grip. Do not wear high-heeled shoes.  Always wear your seat belt. The lap belt should be below your belly. Always practice safe driving.  Do not ride on a motorcycle.  Do not participate in high-impact activities or sports.  Avoid:  Walking on wet or slippery floors.  Fires.  Starting fires.  Lifting heavy pots of boiling or hot liquids.  Fixing electrical problems.  Only take medicine as told by your doctor.  Know your blood type and the blood type of the baby's father.  Call your local emergency services (911 in the [place] if you are a victim of domestic violence or assault. For help and support, contact the [employer]. Get help right away if:  You fall on your belly or have any high-impact accident or injury.  You have been a victim of domestic violence or any kind of violence.  You have been in a car accident.  You have bleeding from your vagina.  Fluid is leaking from your vagina.  You start to have belly cramping (contractions) or pain.  You feel weak or pass out (faint).  You start to throw up (vomit) after an injury.  You have been burned.  You have a stiff neck or neck pain.  You get a headache or have vision problems after an injury.  You do not feel the baby move or the baby is not moving as much as normal. This information is not intended to replace advice given to you by your health care provider. Make sure you discuss any questions you have with your health care provider. Document Released: 11/02/2010 Document Revised: 03/07/2016 Document Reviewed: 07/07/2013 Elsevier Interactive  Patient Education  2017 [employer].

## 2016-10-26 LAB — MATERNIT21 PLUS CORE+SCA: PDF: 0

## 2016-11-07 ENCOUNTER — Other Ambulatory Visit (HOSPITAL_COMMUNITY)
Admission: RE | Admit: 2016-11-07 | Discharge: 2016-11-07 | Disposition: A | Discharge status: Home / Self Care | Payer: Medicaid Other | Source: Ambulatory Visit | Attending: Certified Nurse Midwife | Admitting: Certified Nurse Midwife

## 2016-11-07 ENCOUNTER — Ambulatory Visit (INDEPENDENT_AMBULATORY_CARE_PROVIDER_SITE_OTHER): Payer: Medicaid Other | Admitting: Certified Nurse Midwife

## 2016-11-07 VITALS — BP 138/84 | HR 96 | Wt 286.0 lb

## 2016-11-07 DIAGNOSIS — M549 Dorsalgia, unspecified: Secondary | ICD-10-CM

## 2016-11-07 DIAGNOSIS — A749 Chlamydial infection, unspecified: Secondary | ICD-10-CM

## 2016-11-07 DIAGNOSIS — IMO0001 Reserved for inherently not codable concepts without codable children: Secondary | ICD-10-CM

## 2016-11-07 DIAGNOSIS — O99212 Obesity complicating pregnancy, second trimester: Secondary | ICD-10-CM

## 2016-11-07 DIAGNOSIS — Z3480 Encounter for supervision of other normal pregnancy, unspecified trimester: Secondary | ICD-10-CM | POA: Insufficient documentation

## 2016-11-07 DIAGNOSIS — O9989 Other specified diseases and conditions complicating pregnancy, childbirth and the puerperium: Secondary | ICD-10-CM

## 2016-11-07 DIAGNOSIS — O26899 Other specified pregnancy related conditions, unspecified trimester: Secondary | ICD-10-CM

## 2016-11-07 DIAGNOSIS — Z6841 Body Mass Index (BMI) 40.0 and over, adult: Secondary | ICD-10-CM

## 2016-11-07 DIAGNOSIS — O98811 Other maternal infectious and parasitic diseases complicating pregnancy, first trimester: Secondary | ICD-10-CM

## 2016-11-07 DIAGNOSIS — O98311 Other infections with a predominantly sexual mode of transmission complicating pregnancy, first trimester: Secondary | ICD-10-CM

## 2016-11-07 DIAGNOSIS — R7989 Other specified abnormal findings of blood chemistry: Secondary | ICD-10-CM

## 2016-11-07 DIAGNOSIS — E669 Obesity, unspecified: Secondary | ICD-10-CM

## 2016-11-07 NOTE — Progress Notes (Signed)
   PRENATAL VISIT NOTE  Subjective:  Maria Morgan is a 26 y.o. G2P1001 at [redacted]w[redacted]d being seen today for ongoing prenatal care.  She is currently monitored for the following issues for this low-risk pregnancy and has Supervision of other normal pregnancy, antepartum; Back pain in pregnancy; Pituitary adenoma ([place]); Obese; Chlamydia infection affecting pregnancy in first trimester; and Low vitamin D level on her problem list.  Patient reports no bleeding, no contractions, no cramping, no leaking and vaginal irritation.  Contractions: Not present. Vag. Bleeding: None.  Movement: Present. Denies leaking of fluid.   The following portions of the patient's history were reviewed and updated as appropriate: allergies, current medications, past family history, past medical history, past social history, past surgical history and problem list. Problem list updated.  Objective:   Vitals:   11/07/16 1427  BP: 138/84  Pulse: 96  Weight: 286 lb (129.7 kg)    Fetal Status: Fetal Heart Rate (bpm): 163   Movement: Present     General:  Alert, oriented and cooperative. Patient is in no acute distress.  Skin: Skin is warm and dry. No rash noted.   Cardiovascular: Normal heart rate noted  Respiratory: Normal respiratory effort, no problems with respiration noted  Abdomen: Soft, gravid, appropriate for gestational age. Pain/Pressure: Present     Pelvic:  Cervical exam deferred        Extremities: Normal range of motion.  Edema: None  Mental Status: Normal mood and affect. Normal behavior. Normal judgment and thought content.   Assessment and Plan:  Pregnancy: G2P1001 at [redacted]w[redacted]d  1. Supervision of other normal pregnancy, antepartum    Mat21 redrawn.   - [place] MFM OB COMP + 14 WK; Future - MaterniT21 PLUS Core+SCA  2. Low vitamin D level    Taking vitamin D weekly  3. Class 3 obesity without serious comorbidity with body mass index (BMI) of 45.0 to 49.9 in adult, unspecified obesity type ([place])  10-15 # weight gain recommended.  She has gained 16# so far this pregnancy.    4. Chlamydia infection affecting pregnancy in first trimester     TOC today   5. Back pain in pregnancy    Not taking Flexeril.    Preterm labor symptoms and general obstetric precautions including but not limited to vaginal bleeding, contractions, leaking of fluid and fetal movement were reviewed in detail with the patient. Please refer to After Visit Summary for other counseling recommendations.  Return in about 4 weeks (around 12/05/2016) for ROB.   Morene Crocker, CNM

## 2016-11-07 NOTE — Progress Notes (Signed)
Pt c/o possible yeast infection. She has "yellowish" discharge with slight odor x 1 wk.

## 2016-11-08 LAB — CERVICOVAGINAL ANCILLARY ONLY
BACTERIAL VAGINITIS: NEGATIVE
CANDIDA VAGINITIS: NEGATIVE
CHLAMYDIA, DNA PROBE: NEGATIVE
Neisseria Gonorrhea: NEGATIVE
TRICH (WINDOWPATH): NEGATIVE

## 2016-11-21 ENCOUNTER — Telehealth: Payer: Self-pay

## 2016-11-21 ENCOUNTER — Other Ambulatory Visit: Payer: Self-pay | Admitting: Certified Nurse Midwife

## 2016-11-21 ENCOUNTER — Encounter: Payer: Self-pay | Admitting: Certified Nurse Midwife

## 2016-11-21 LAB — MATERNIT21 PLUS CORE+SCA

## 2016-11-21 NOTE — Telephone Encounter (Signed)
Informed pt we were unable to get a results from the Mat 21 genetic test. [facility] states there wasn't enough DNA for results. Offered pt lab visit for redraw but pt decided to wait until upcoming visit on 12/05/16.

## 2016-12-02 ENCOUNTER — Ambulatory Visit (HOSPITAL_COMMUNITY)
Admission: RE | Admit: 2016-12-02 | Discharge: 2016-12-02 | Disposition: A | Discharge status: Home / Self Care | Payer: Medicaid Other | Source: Ambulatory Visit | Attending: Certified Nurse Midwife | Admitting: Certified Nurse Midwife

## 2016-12-02 DIAGNOSIS — Z3A18 18 weeks gestation of pregnancy: Secondary | ICD-10-CM | POA: Diagnosis not present

## 2016-12-02 DIAGNOSIS — Z3689 Encounter for other specified antenatal screening: Secondary | ICD-10-CM | POA: Diagnosis not present

## 2016-12-02 DIAGNOSIS — A749 Chlamydial infection, unspecified: Secondary | ICD-10-CM

## 2016-12-02 DIAGNOSIS — O98811 Other maternal infectious and parasitic diseases complicating pregnancy, first trimester: Secondary | ICD-10-CM

## 2016-12-03 ENCOUNTER — Other Ambulatory Visit: Payer: Self-pay | Admitting: Certified Nurse Midwife

## 2016-12-03 DIAGNOSIS — Z3480 Encounter for supervision of other normal pregnancy, unspecified trimester: Secondary | ICD-10-CM

## 2016-12-05 ENCOUNTER — Ambulatory Visit (INDEPENDENT_AMBULATORY_CARE_PROVIDER_SITE_OTHER): Payer: Medicaid Other | Admitting: Certified Nurse Midwife

## 2016-12-05 VITALS — BP 134/78 | HR 87 | Wt 284.0 lb

## 2016-12-05 DIAGNOSIS — O99212 Obesity complicating pregnancy, second trimester: Secondary | ICD-10-CM

## 2016-12-05 DIAGNOSIS — A749 Chlamydial infection, unspecified: Secondary | ICD-10-CM

## 2016-12-05 DIAGNOSIS — Z3482 Encounter for supervision of other normal pregnancy, second trimester: Secondary | ICD-10-CM

## 2016-12-05 DIAGNOSIS — Z3480 Encounter for supervision of other normal pregnancy, unspecified trimester: Secondary | ICD-10-CM

## 2016-12-05 DIAGNOSIS — E669 Obesity, unspecified: Secondary | ICD-10-CM

## 2016-12-05 DIAGNOSIS — IMO0001 Reserved for inherently not codable concepts without codable children: Secondary | ICD-10-CM

## 2016-12-05 DIAGNOSIS — R7989 Other specified abnormal findings of blood chemistry: Secondary | ICD-10-CM

## 2016-12-05 DIAGNOSIS — O98312 Other infections with a predominantly sexual mode of transmission complicating pregnancy, second trimester: Secondary | ICD-10-CM

## 2016-12-05 DIAGNOSIS — O98811 Other maternal infectious and parasitic diseases complicating pregnancy, first trimester: Secondary | ICD-10-CM

## 2016-12-05 NOTE — Progress Notes (Signed)
   PRENATAL VISIT NOTE  Subjective:  Maria Morgan is a 26 y.o. G2P1001 at [redacted]w[redacted]d being seen today for ongoing prenatal care.  She is currently monitored for the following issues for this low-risk pregnancy and has Supervision of other normal pregnancy, antepartum; Back pain in pregnancy; Pituitary adenoma ([place]); Obese; Chlamydia infection affecting pregnancy in first trimester; and Low vitamin D level on her problem list.  Patient reports no complaints.  Contractions: Not present. Vag. Bleeding: None.  Movement: Present. Denies leaking of fluid.   The following portions of the patient's history were reviewed and updated as appropriate: allergies, current medications, past family history, past medical history, past social history, past surgical history and problem list. Problem list updated.  Objective:   Vitals:   12/05/16 1355  BP: 134/78  Pulse: 87  Weight: 284 lb (128.8 kg)    Fetal Status: Fetal Heart Rate (bpm): 155   Movement: Present     General:  Alert, oriented and cooperative. Patient is in no acute distress.  Skin: Skin is warm and dry. No rash noted.   Cardiovascular: Normal heart rate noted  Respiratory: Normal respiratory effort, no problems with respiration noted  Abdomen: Soft, gravid, appropriate for gestational age. Pain/Pressure: Absent     Pelvic:  Cervical exam deferred        Extremities: Normal range of motion.     Mental Status: Normal mood and affect. Normal behavior. Normal judgment and thought content.   Assessment and Plan:  Pregnancy: G2P1001 at [redacted]w[redacted]d  1. Supervision of other normal pregnancy, antepartum     Slightly elevated blood pressure today, still normotensive.  Had fast food for lunch.  - MaterniT21 PLUS Core+SCA - AFP, Serum, Open Spina Bifida  2. Low vitamin D level     Taking weekly vitamin D.   3. Class 3 obesity without serious comorbidity with body mass index (BMI) of 45.0 to 49.9 in adult, unspecified obesity type ([facility])      BMI  42.3  4. Chlamydia infection affecting pregnancy in first trimester     TOC negative 10/2016  Preterm labor symptoms and general obstetric precautions including but not limited to vaginal bleeding, contractions, leaking of fluid and fetal movement were reviewed in detail with the patient. Please refer to After Visit Summary for other counseling recommendations.  Return in about 4 weeks (around 01/02/2017) for ROB.   Morene Crocker, CNM

## 2016-12-05 NOTE — Patient Instructions (Signed)
Places to have your son circumcised:    [facility] [phone] $500+ by discharge  [facility] [phone] $244 by 4 wks  [facility] [phone] $175 by 2 wks  [facility] [phone] $220 by 4 wks [place] [phone] $150 by 4 wks  These prices sometimes change but are roughly what you can expect to pay. Please call and confirm pricing.   Circumcision is considered an elective/non-medically necessary procedure. There are many reasons parents decide to have their sons circumsized. During the first year of life circumcised males have a reduced risk of urinary tract infections but after this year the rates between circumcised males and uncircumcised males are the same.  It is safe to have your son circumcised outside of the hospital and the places above perform them regularly.

## 2016-12-13 ENCOUNTER — Other Ambulatory Visit: Payer: Self-pay | Admitting: Certified Nurse Midwife

## 2016-12-13 DIAGNOSIS — Z3480 Encounter for supervision of other normal pregnancy, unspecified trimester: Secondary | ICD-10-CM

## 2016-12-13 LAB — AFP, SERUM, OPEN SPINA BIFIDA
AFP MoM: 1.32
AFP Value: 46.1 ng/mL
GEST. AGE ON COLLECTION DATE: 18.5 wk
Maternal Age At EDD: 26.6 years
OSBR RISK 1 IN: 9058
TEST RESULTS AFP: NEGATIVE
WEIGHT: 284 [lb_av]

## 2017-01-02 ENCOUNTER — Encounter: Payer: Self-pay | Admitting: Certified Nurse Midwife

## 2017-01-02 ENCOUNTER — Ambulatory Visit (INDEPENDENT_AMBULATORY_CARE_PROVIDER_SITE_OTHER): Payer: Medicaid Other | Admitting: Certified Nurse Midwife

## 2017-01-02 DIAGNOSIS — Z3480 Encounter for supervision of other normal pregnancy, unspecified trimester: Secondary | ICD-10-CM

## 2017-01-02 DIAGNOSIS — Z3482 Encounter for supervision of other normal pregnancy, second trimester: Secondary | ICD-10-CM

## 2017-01-02 LAB — POCT RAPID STREP A (OFFICE): Rapid Strep A Screen: NEGATIVE

## 2017-01-02 NOTE — Progress Notes (Signed)
   PRENATAL VISIT NOTE  Subjective:  Maria Morgan is a 26 y.o. G2P1001 at [redacted]w[redacted]d being seen today for ongoing prenatal care.  She is currently monitored for the following issues for this low-risk pregnancy and has Supervision of other normal pregnancy, antepartum; Back pain in pregnancy; Pituitary adenoma ([place]); Obese; Chlamydia infection affecting pregnancy in first trimester; and Low vitamin D level on her problem list.  Patient reports no bleeding, no contractions, no cramping, no leaking and URI.  Contractions: Not present. Vag. Bleeding: None.  Movement: Present. Denies leaking of fluid. URI symptoms, denies fever, reports nasal congestion, cough, no sputum production   The following portions of the patient's history were reviewed and updated as appropriate: allergies, current medications, past family history, past medical history, past social history, past surgical history and problem list. Problem list updated.  Objective:   Vitals:   01/02/17 1438  BP: 130/82  Pulse: 94  Weight: 278 lb 3.2 oz (126.2 kg)  Afebrile: 98.4 oral  Fetal Status: Fetal Heart Rate (bpm): 152 Fundal Height: 24 cm Movement: Present     General:  Alert, oriented and cooperative. Patient is in no acute distress.  Skin: Skin is warm and dry. No rash noted.   Cardiovascular: Normal heart rate noted  Respiratory: Normal respiratory effort, no problems with respiration noted  Abdomen: Soft, gravid, appropriate for gestational age. Pain/Pressure: Absent     Pelvic:  Cervical exam deferred        Extremities: Normal range of motion.  Edema: Trace  Mental Status: Normal mood and affect. Normal behavior. Normal judgment and thought content.   Negative influenza and strep testing  Assessment and Plan:  Pregnancy: G2P1001 at [redacted]w[redacted]d  1. Supervision of other normal pregnancy, antepartum      ILI.  Discussed OTC cold treatments, rest and fluids encouraged.   - Influenza a and b - POCT rapid strep A  Preterm  labor symptoms and general obstetric precautions including but not limited to vaginal bleeding, contractions, leaking of fluid and fetal movement were reviewed in detail with the patient. Please refer to After Visit Summary for other counseling recommendations.  Return in about 4 weeks (around 01/30/2017) for ROB.   Morene Crocker, CNM

## 2017-01-02 NOTE — Progress Notes (Signed)
Patient states she has had cold symptoms all week such as, sneezing, coughing, sore throat. Pt reports good fetal movement.

## 2017-01-03 LAB — PLEASE NOTE:

## 2017-01-03 LAB — INFLUENZA A AND B
INFLUENZA A AG, EIA: NEGATIVE
INFLUENZA B AG, EIA: NEGATIVE

## 2017-01-15 ENCOUNTER — Ambulatory Visit (HOSPITAL_COMMUNITY)
Admission: RE | Admit: 2017-01-15 | Discharge: 2017-01-15 | Disposition: A | Discharge status: Home / Self Care | Payer: Medicaid Other | Source: Ambulatory Visit | Attending: Certified Nurse Midwife | Admitting: Certified Nurse Midwife

## 2017-01-15 DIAGNOSIS — Z3482 Encounter for supervision of other normal pregnancy, second trimester: Secondary | ICD-10-CM | POA: Insufficient documentation

## 2017-01-15 DIAGNOSIS — Z3A24 24 weeks gestation of pregnancy: Secondary | ICD-10-CM | POA: Insufficient documentation

## 2017-01-15 DIAGNOSIS — Z3480 Encounter for supervision of other normal pregnancy, unspecified trimester: Secondary | ICD-10-CM

## 2017-01-22 ENCOUNTER — Other Ambulatory Visit: Payer: Self-pay | Admitting: Certified Nurse Midwife

## 2017-01-22 DIAGNOSIS — Z3480 Encounter for supervision of other normal pregnancy, unspecified trimester: Secondary | ICD-10-CM

## 2017-01-30 ENCOUNTER — Encounter: Payer: Self-pay | Admitting: *Deleted

## 2017-01-30 ENCOUNTER — Ambulatory Visit (INDEPENDENT_AMBULATORY_CARE_PROVIDER_SITE_OTHER): Payer: Medicaid Other | Admitting: Certified Nurse Midwife

## 2017-01-30 VITALS — BP 129/80 | HR 101 | Wt 276.7 lb

## 2017-01-30 DIAGNOSIS — IMO0001 Reserved for inherently not codable concepts without codable children: Secondary | ICD-10-CM

## 2017-01-30 DIAGNOSIS — E559 Vitamin D deficiency, unspecified: Secondary | ICD-10-CM

## 2017-01-30 DIAGNOSIS — Z3480 Encounter for supervision of other normal pregnancy, unspecified trimester: Secondary | ICD-10-CM

## 2017-01-30 DIAGNOSIS — O99212 Obesity complicating pregnancy, second trimester: Secondary | ICD-10-CM

## 2017-01-30 DIAGNOSIS — Z3482 Encounter for supervision of other normal pregnancy, second trimester: Secondary | ICD-10-CM

## 2017-01-30 DIAGNOSIS — R7989 Other specified abnormal findings of blood chemistry: Secondary | ICD-10-CM

## 2017-01-30 NOTE — Progress Notes (Signed)
   PRENATAL VISIT NOTE  Subjective:  Maria Morgan is a 26 y.o. G2P1001 at [redacted]w[redacted]d being seen today for ongoing prenatal care.  She is currently monitored for the following issues for this low-risk pregnancy and has Supervision of other normal pregnancy, antepartum; Back pain in pregnancy; Pituitary adenoma ([place]); Obese; Chlamydia infection affecting pregnancy in first trimester; and Low vitamin D level on her problem list.  Patient reports an episode of "feeling dizzy" the other day in store. Patient admits to "not eating much" and denies drinking water.  Contractions: Not present. Vag. Bleeding: None.  Movement: Present. Denies leaking of fluid.   The following portions of the patient's history were reviewed and updated as appropriate: allergies, current medications, past family history, past medical history, past social history, past surgical history and problem list. Problem list updated.  Objective:   Vitals:   01/30/17 1444  BP: 129/80  Pulse: (!) 101  Weight: 276 lb 11.2 oz (125.5 kg)    Fetal Status: Fetal Heart Rate (bpm): 153 Fundal Height: 27 cm Movement: Present     General:  Alert, oriented and cooperative. Patient is in no acute distress.  Skin: Skin is warm and dry. No rash noted.   Cardiovascular: Normal heart rate noted  Respiratory: Normal respiratory effort, no problems with respiration noted  Abdomen: Soft, gravid, appropriate for gestational age. Pain/Pressure: Absent     Pelvic:  Cervical exam deferred        Extremities: Normal range of motion.  Edema: None  Mental Status: Normal mood and affect. Normal behavior. Normal judgment and thought content.   Assessment and Plan:  Pregnancy: G2P1001 at [redacted]w[redacted]d 1. Supervision of other normal pregnancy, antepartum     Doing well  2. Class 3 obesity without serious comorbidity with body mass index (BMI) of 45.0 to 49.9 in adult, unspecified obesity type ([place])    6 lb weight gain this pregnancy  3. Low vitamin D level     Taking weekly vitamin D.    Preterm labor symptoms and general obstetric precautions including but not limited to vaginal bleeding, contractions, leaking of fluid and fetal movement were reviewed in detail with the patient. Please refer to After Visit Summary for other counseling recommendations.  Return in about 2 weeks (around 02/13/2017) for ROB, 2 hr OGTT.   Morene Crocker, CNM

## 2017-02-07 ENCOUNTER — Inpatient Hospital Stay (HOSPITAL_COMMUNITY)
Admission: AD | Admit: 2017-02-07 | Discharge: 2017-02-08 | Disposition: A | Discharge status: Home / Self Care | Payer: Medicaid Other | Source: Ambulatory Visit | Attending: Obstetrics & Gynecology | Admitting: Obstetrics & Gynecology

## 2017-02-07 ENCOUNTER — Encounter (HOSPITAL_COMMUNITY): Payer: Self-pay

## 2017-02-07 DIAGNOSIS — Z91041 Radiographic dye allergy status: Secondary | ICD-10-CM | POA: Diagnosis not present

## 2017-02-07 DIAGNOSIS — Z3A28 28 weeks gestation of pregnancy: Secondary | ICD-10-CM | POA: Insufficient documentation

## 2017-02-07 DIAGNOSIS — W010XXA Fall on same level from slipping, tripping and stumbling without subsequent striking against object, initial encounter: Secondary | ICD-10-CM | POA: Diagnosis not present

## 2017-02-07 DIAGNOSIS — Z87891 Personal history of nicotine dependence: Secondary | ICD-10-CM | POA: Insufficient documentation

## 2017-02-07 DIAGNOSIS — Z86011 Personal history of benign neoplasm of the brain: Secondary | ICD-10-CM | POA: Insufficient documentation

## 2017-02-07 DIAGNOSIS — Z3A27 27 weeks gestation of pregnancy: Secondary | ICD-10-CM

## 2017-02-07 DIAGNOSIS — O4690 Antepartum hemorrhage, unspecified, unspecified trimester: Secondary | ICD-10-CM

## 2017-02-07 DIAGNOSIS — W19XXXA Unspecified fall, initial encounter: Secondary | ICD-10-CM | POA: Diagnosis not present

## 2017-02-07 DIAGNOSIS — O9989 Other specified diseases and conditions complicating pregnancy, childbirth and the puerperium: Secondary | ICD-10-CM | POA: Diagnosis not present

## 2017-02-07 DIAGNOSIS — M545 Low back pain: Secondary | ICD-10-CM | POA: Insufficient documentation

## 2017-02-07 DIAGNOSIS — Y92009 Unspecified place in unspecified non-institutional (private) residence as the place of occurrence of the external cause: Secondary | ICD-10-CM | POA: Insufficient documentation

## 2017-02-07 DIAGNOSIS — R109 Unspecified abdominal pain: Secondary | ICD-10-CM | POA: Insufficient documentation

## 2017-02-07 DIAGNOSIS — Z8619 Personal history of other infectious and parasitic diseases: Secondary | ICD-10-CM | POA: Diagnosis not present

## 2017-02-07 DIAGNOSIS — O4693 Antepartum hemorrhage, unspecified, third trimester: Secondary | ICD-10-CM

## 2017-02-07 DIAGNOSIS — O26893 Other specified pregnancy related conditions, third trimester: Secondary | ICD-10-CM | POA: Insufficient documentation

## 2017-02-07 DIAGNOSIS — O9A213 Injury, poisoning and certain other consequences of external causes complicating pregnancy, third trimester: Secondary | ICD-10-CM | POA: Diagnosis not present

## 2017-02-07 DIAGNOSIS — O26853 Spotting complicating pregnancy, third trimester: Secondary | ICD-10-CM | POA: Insufficient documentation

## 2017-02-07 LAB — URINALYSIS, ROUTINE W REFLEX MICROSCOPIC
BILIRUBIN URINE: NEGATIVE
GLUCOSE, UA: NEGATIVE mg/dL
Hgb urine dipstick: NEGATIVE
KETONES UR: NEGATIVE mg/dL
Leukocytes, UA: NEGATIVE
NITRITE: NEGATIVE
PH: 8 (ref 5.0–8.0)
PROTEIN: NEGATIVE mg/dL
Specific Gravity, Urine: 1.003 — ABNORMAL LOW (ref 1.005–1.030)

## 2017-02-07 MED ORDER — CYCLOBENZAPRINE HCL 10 MG PO TABS
10.0000 mg | ORAL_TABLET | Freq: Once | ORAL | Status: AC
  Administered 2017-02-07: 10 mg via ORAL
  Filled 2017-02-07: qty 1

## 2017-02-07 NOTE — MAU Provider Note (Signed)
History     CSN: [id number]  Arrival date and time: 02/07/17 2250   First Provider Initiated Contact with Patient 02/07/17 2334      Chief Complaint  Patient presents with  . Fall  . Abdominal Pain  . Back Pain   HPI [name] is a 26 y.o. G2P1001 at [redacted]w[redacted]d who presents to MAU today with complaint of a fall at home this evening. The patient states that she slipped and fell in the kitchen at home. She caught herself on the counter and fell onto her side. She denies abdominal trauma. She had noted a spot of blood in her underwear. She has constant lower abdominal and low back pain since the incident. She denies contractions. She reports normal fetal movement.   OB History    Gravida Para Term Preterm AB Living   2 1 1     1    SAB TAB Ectopic Multiple Live Births           1      Past Medical History:  Diagnosis Date  . Brain tumor (benign) ([place])   . Chlamydia   . Gonorrhea     Past Surgical History:  Procedure Laterality Date  . NO PAST SURGERIES      Family History  Problem Relation Age of Onset  . Cancer Mother   . Diabetes Paternal Grandfather     Social History  Substance Use Topics  . Smoking status: Former Smoker    Types: Cigarettes    Quit date: 10/05/2010  . Smokeless tobacco: Never Used  . Alcohol use No    Allergies:  Allergies  Allergen Reactions  . Iodine Shortness Of Breath and Itching    Contrast in IV    No prescriptions prior to admission.    Review of Systems  Constitutional: Negative for fever.  Gastrointestinal: Positive for abdominal pain. Negative for constipation, diarrhea, nausea and vomiting.  Genitourinary: Positive for vaginal bleeding. Negative for vaginal discharge.  Musculoskeletal: Positive for back pain.   Physical Exam   Blood pressure 131/74, pulse 81, temperature 98.2 F (36.8 C), temperature source Oral, resp. rate 16, height 5\' 7"  (1.702 m), weight 274 lb (124.3 kg), last menstrual period  07/18/2016.  Physical Exam  Nursing note and vitals reviewed. Constitutional: She is oriented to person, place, and time. She appears well-developed and well-nourished. No distress.  HENT:  Head: Normocephalic and atraumatic.  Cardiovascular: Normal rate.   Respiratory: Effort normal.  GI: Soft. She exhibits no distension and no mass. There is no tenderness. There is no rebound, no guarding and no CVA tenderness.  Genitourinary: Uterus is enlarged. Uterus is not tender. Cervix exhibits no motion tenderness, no discharge and no friability. No bleeding in the vagina. Vaginal discharge (small amount of thin, white discharge noted. No pooling. ) found.  Musculoskeletal:       Lumbar back: She exhibits no tenderness.  Neurological: She is alert and oriented to person, place, and time.  Skin: Skin is warm and dry. No erythema.  Psychiatric: She has a normal mood and affect.  Dilation: Closed Exam by:: Kerry Hough PA   Results for orders placed or performed during the hospital encounter of 02/07/17 (from the past 24 hour(s))  Urinalysis, Routine w reflex microscopic     Status: Abnormal   Collection Time: 02/07/17 11:10 PM  Result Value Ref Range   Color, Urine STRAW (A) YELLOW   APPearance CLEAR CLEAR   Specific Gravity, Urine 1.003 (L) 1.005 -  1.030   pH 8.0 5.0 - 8.0   Glucose, UA NEGATIVE NEGATIVE mg/dL   Hgb urine dipstick NEGATIVE NEGATIVE   Bilirubin Urine NEGATIVE NEGATIVE   Ketones, ur NEGATIVE NEGATIVE mg/dL   Protein, ur NEGATIVE NEGATIVE mg/dL   Nitrite NEGATIVE NEGATIVE   Leukocytes, UA NEGATIVE NEGATIVE   Fetal Monitoring: Baseline: 135 bpm Variability: moderate Accelerations: 10 x 10 Decelerations: none Contractions: none   MAU Course  Procedures None  MDM UA today - normal [place] today. Preliminary report - normal Flexeril given for pain, patient reports significant improvement in pain  Assessment and Plan  A: SIUP at [redacted]w[redacted]d Fall at home Abdominal pain in  pregnancy Spotting in pregnancy  P: Discharge home Rx for Flexeril given  Preterm labor  precautions discussed Patient advised to follow-up with [place] Patient may return to MAU as needed or if her condition were to change or worsen  Luvenia Redden, PA-C  02/08/2017, 3:53 AM

## 2017-02-07 NOTE — MAU Note (Signed)
I fell about 22mins ago. I fell on L side and caught myself. Did not hit my stomach. Baby moving well. Denies LOF or bleeding

## 2017-02-08 ENCOUNTER — Inpatient Hospital Stay (HOSPITAL_COMMUNITY): Payer: Medicaid Other

## 2017-02-08 DIAGNOSIS — W19XXXA Unspecified fall, initial encounter: Secondary | ICD-10-CM

## 2017-02-08 DIAGNOSIS — O9989 Other specified diseases and conditions complicating pregnancy, childbirth and the puerperium: Secondary | ICD-10-CM | POA: Diagnosis not present

## 2017-02-08 DIAGNOSIS — Y92009 Unspecified place in unspecified non-institutional (private) residence as the place of occurrence of the external cause: Secondary | ICD-10-CM | POA: Diagnosis not present

## 2017-02-08 DIAGNOSIS — R109 Unspecified abdominal pain: Secondary | ICD-10-CM

## 2017-02-08 MED ORDER — CYCLOBENZAPRINE HCL 10 MG PO TABS: 10.0000 mg | ORAL_TABLET | Freq: Three times a day (TID) | ORAL | 0 refills | Status: DC | PRN

## 2017-02-08 NOTE — Discharge Instructions (Signed)
Preventing Injuries During Pregnancy Injuries can happen during pregnancy. Minor falls and accidents usually do not harm you or your baby. But some injuries can harm you and your baby. Tell your doctor about any injury you suffer. What can I do to avoid injuries? Safety  Remove rugs and loose objects on the floor.  Wear comfortable shoes that have a good grip. Do not wear shoes that have high heels.  Always wear your seat belt in the car. The lap belt should be below your belly. Always drive safely.  Do not ride on a motorcycle. Activity  Do not take part in rough and violent activities or sports.  Avoid: ? Walking on wet or slippery floors. ? Lifting heavy pots of boiling or hot liquids. ? Fixing electrical problems. ? Being near fires. General instructions  Take over-the-counter and prescription medicines only as told by your doctor.  Know your blood type and the blood type of the baby's father.  If you are a victim of domestic violence: ? Call your local emergency services (911 in the [place] ? Contact the National Domestic Violence Hotline for help and support. Get help right away if:  You fall on your belly or receive any serious blow to your belly.  You have a stiff neck or neck pain after a fall or an injury.  You get a headache or have problems with vision after an injury.  You do not feel the baby move or the baby is not moving as much as normal.  You have been a victim of domestic violence or any other kind of attack.  You have been in a car accident.  You have bleeding from your vagina.  Fluid is leaking from your vagina.  You start to have cramping or pain in your belly (contractions).  You have very bad pain in your lower back.  You feel weak or pass out (faint).  You start to throw up (vomit) after an injury.  You have been burned. Summary  Some injuries that happen during pregnancy can do harm to the baby.  Tell your doctor about any  injury.  Take steps to avoid injury. This includes removing rugs and loose objects on the floor. Always wear your seat belt in the car.  Do not take part in rough and violent activities or sports.  Get help right away if you have any serious accident or injury. This information is not intended to replace advice given to you by your health care provider. Make sure you discuss any questions you have with your health care provider. Document Released: 11/02/2010 Document Revised: 10/09/2016 Document Reviewed: 10/09/2016 Elsevier Interactive Patient Education  2017 [employer]  

## 2017-02-14 ENCOUNTER — Ambulatory Visit (INDEPENDENT_AMBULATORY_CARE_PROVIDER_SITE_OTHER): Payer: Medicaid Other | Admitting: Certified Nurse Midwife

## 2017-02-14 ENCOUNTER — Other Ambulatory Visit: Payer: Medicaid Other

## 2017-02-14 VITALS — BP 138/79 | HR 93 | Wt 270.0 lb

## 2017-02-14 DIAGNOSIS — O26899 Other specified pregnancy related conditions, unspecified trimester: Secondary | ICD-10-CM

## 2017-02-14 DIAGNOSIS — R7989 Other specified abnormal findings of blood chemistry: Secondary | ICD-10-CM

## 2017-02-14 DIAGNOSIS — O9989 Other specified diseases and conditions complicating pregnancy, childbirth and the puerperium: Secondary | ICD-10-CM

## 2017-02-14 DIAGNOSIS — O9A213 Injury, poisoning and certain other consequences of external causes complicating pregnancy, third trimester: Secondary | ICD-10-CM

## 2017-02-14 DIAGNOSIS — Z3480 Encounter for supervision of other normal pregnancy, unspecified trimester: Secondary | ICD-10-CM

## 2017-02-14 DIAGNOSIS — E559 Vitamin D deficiency, unspecified: Secondary | ICD-10-CM

## 2017-02-14 DIAGNOSIS — IMO0001 Reserved for inherently not codable concepts without codable children: Secondary | ICD-10-CM

## 2017-02-14 DIAGNOSIS — Z3483 Encounter for supervision of other normal pregnancy, third trimester: Secondary | ICD-10-CM

## 2017-02-14 DIAGNOSIS — O99891 Other specified diseases and conditions complicating pregnancy: Secondary | ICD-10-CM

## 2017-02-14 DIAGNOSIS — E669 Obesity, unspecified: Secondary | ICD-10-CM

## 2017-02-14 DIAGNOSIS — M549 Dorsalgia, unspecified: Secondary | ICD-10-CM

## 2017-02-14 NOTE — Progress Notes (Signed)
   PRENATAL VISIT NOTE  Subjective:  Maria Morgan is a 26 y.o. G2P1001 at 109w6d being seen today for ongoing prenatal care.  She is currently monitored for the following issues for this low-risk pregnancy and has Supervision of other normal pregnancy, antepartum; Back pain in pregnancy; Pituitary adenoma ([place]); Obese; Chlamydia infection affecting pregnancy in first trimester; Low vitamin D level; and Traumatic injury during pregnancy in third trimester on her problem list.  Patient reports no bleeding, no leaking, occasional contractions and muscle tenderness since fall 02/08/17.  Denies any vaginal bleeding or LOF.  Contractions: Not present. Vag. Bleeding: None.  Movement: Present. Denies leaking of fluid.   The following portions of the patient's history were reviewed and updated as appropriate: allergies, current medications, past family history, past medical history, past social history, past surgical history and problem list. Problem list updated.  Objective:   Vitals:   02/14/17 0813  BP: 138/79  Pulse: 93  Weight: 270 lb (122.5 kg)    Fetal Status:     Movement: Present     General:  Alert, oriented and cooperative. Patient is in no acute distress.  Skin: Skin is warm and dry. No rash noted.   Cardiovascular: Normal heart rate noted  Respiratory: Normal respiratory effort, no problems with respiration noted  Abdomen: Soft, gravid, appropriate for gestational age. Pain/Pressure: Absent     Pelvic:  Cervical exam deferred        Extremities: Normal range of motion.  Edema: None  Mental Status: Normal mood and affect. Normal behavior. Normal judgment and thought content.   Assessment and Plan:  Pregnancy: G2P1001 at [redacted]w[redacted]d  1. Supervision of other normal pregnancy, antepartum      Doing well - Glucose Tolerance, 2 Hours w/1 Hour - CBC - HIV antibody - RPR  2. Back pain in pregnancy    Has maternity support belt  3. Class 3 obesity without serious comorbidity with  body mass index (BMI) of 45.0 to 49.9 in adult, unspecified obesity type ([place])    Has lost roughly 14lbs this pregnancy  4. Low vitamin D level    Taking weekly vitamin D  5. Traumatic injury during pregnancy in third trimester     S/P fall at home on 02/08/17.  Doing well since then, no bleeding, LOF and + FM.    Preterm labor symptoms and general obstetric precautions including but not limited to vaginal bleeding, contractions, leaking of fluid and fetal movement were reviewed in detail with the patient. Please refer to After Visit Summary for other counseling recommendations.  Return in about 2 weeks (around 02/28/2017) for [place].   Morene Crocker, CNM

## 2017-02-14 NOTE — Patient Instructions (Addendum)
Back Pain in Pregnancy Back pain during pregnancy is common. Back pain may be caused by several factors that are related to changes during your pregnancy. Follow these instructions at home: Managing pain, stiffness, and swelling   If directed, apply ice for sudden (acute) back pain.  Put ice in a plastic bag.  Place a towel between your skin and the bag.  Leave the ice on for 20 minutes, 2-3 times per day.  If directed, apply heat to the affected area before you exercise:  Place a towel between your skin and the heat pack or heating pad.  Leave the heat on for 20-30 minutes.  Remove the heat if your skin turns bright red. This is especially important if you are unable to feel pain, heat, or cold. You may have a greater risk of getting burned. Activity   Exercise as told by your health care provider. Exercising is the best way to prevent or manage back pain.  Listen to your body when lifting. If lifting hurts, ask for help or bend your knees. This uses your leg muscles instead of your back muscles.  Squat down when picking up something from the floor. Do not bend over.  Only use bed rest as told by your health care provider. Bed rest should only be used for the most severe episodes of back pain. Standing, Sitting, and Lying Down   Do not stand in one place for long periods of time.  Use good posture when sitting. Make sure your head rests over your shoulders and is not hanging forward. Use a pillow on your lower back if necessary.  Try sleeping on your side, preferably the left side, with a pillow or two between your legs. If you are sore after a night's rest, your bed may be too soft. A firm mattress may provide more support for your back during pregnancy. General instructions   Do not wear high heels.  Eat a healthy diet. Try to gain weight within your health care provider's recommendations.  Use a maternity girdle, elastic sling, or back brace as told by your health care  provider.  Take over-the-counter and prescription medicines only as told by your health care provider.  Keep all follow-up visits as told by your health care provider. This is important. This includes any visits with any specialists, such as a physical therapist. Contact a health care provider if:  Your back pain interferes with your daily activities.  You have increasing pain in other parts of your body. Get help right away if:  You develop numbness, tingling, weakness, or problems with the use of your arms or legs.  You develop severe back pain that is not controlled with medicine.  You have a sudden change in bowel or bladder control.  You develop shortness of breath, dizziness, or you faint.  You develop nausea, vomiting, or sweating.  You have back pain that is a rhythmic, cramping pain similar to labor pains. Labor pain is usually 1-2 minutes apart, lasts for about 1 minute, and involves a bearing down feeling or pressure in your pelvis.  You have back pain and your water breaks or you have vaginal bleeding.  You have back pain or numbness that travels down your leg.  Your back pain developed after you fell.  You develop pain on one side of your back.  You see blood in your urine.  You develop skin blisters in the area of your back pain. This information is not intended to replace  advice given to you by your health care provider. Make sure you discuss any questions you have with your health care provider. Document Released: 01/08/2006 Document Revised: 03/07/2016 Document Reviewed: 06/14/2015 Elsevier Interactive Patient Education  2017 [place] of Pregnancy The third trimester is from week 28 through week 40 (months 7 through 9). The third trimester is a time when the unborn baby (fetus) is growing rapidly. At the end of the ninth month, the fetus is about 20 inches in length and weighs 6-10 pounds. Body changes during your third trimester Your  body will continue to go through many changes during pregnancy. The changes vary from woman to woman. During the third trimester:  Your weight will continue to increase. You can expect to gain 25-35 pounds (11-16 kg) by the end of the pregnancy.  You may begin to get stretch marks on your hips, abdomen, and breasts.  You may urinate more often because the fetus is moving lower into your pelvis and pressing on your bladder.  You may develop or continue to have heartburn. This is caused by increased hormones that slow down muscles in the digestive tract.  You may develop or continue to have constipation because increased hormones slow digestion and cause the muscles that push waste through your intestines to relax.  You may develop hemorrhoids. These are swollen veins (varicose veins) in the rectum that can itch or be painful.  You may develop swollen, bulging veins (varicose veins) in your legs.  You may have increased body aches in the pelvis, back, or thighs. This is due to weight gain and increased hormones that are relaxing your joints.  You may have changes in your hair. These can include thickening of your hair, rapid growth, and changes in texture. Some women also have hair loss during or after pregnancy, or hair that feels dry or thin. Your hair will most likely return to normal after your baby is born.  Your breasts will continue to grow and they will continue to become tender. A yellow fluid (colostrum) may leak from your breasts. This is the first milk you are producing for your baby.  Your belly button may stick out.  You may notice more swelling in your hands, face, or ankles.  You may have increased tingling or numbness in your hands, arms, and legs. The skin on your belly may also feel numb.  You may feel short of breath because of your expanding uterus.  You may have more problems sleeping. This can be caused by the size of your belly, increased need to urinate, and an  increase in your body's metabolism.  You may notice the fetus "dropping," or moving lower in your abdomen (lightening).  You may have increased vaginal discharge.  You may notice your joints feel loose and you may have pain around your pelvic bone. What to expect at prenatal visits You will have prenatal exams every 2 weeks until week 36. Then you will have weekly prenatal exams. During a routine prenatal visit:  You will be weighed to make sure you and the baby are growing normally.  Your blood pressure will be taken.  Your abdomen will be measured to track your baby's growth.  The fetal heartbeat will be listened to.  Any test results from the previous visit will be discussed.  You may have a cervical check near your due date to see if your cervix has softened or thinned (effaced).  You will be tested for Group B  streptococcus. This happens between 35 and 37 weeks. Your health care provider may ask you:  What your birth plan is.  How you are feeling.  If you are feeling the baby move.  If you have had any abnormal symptoms, such as leaking fluid, bleeding, severe headaches, or abdominal cramping.  If you are using any tobacco products, including cigarettes, chewing tobacco, and electronic cigarettes.  If you have any questions. Other tests or screenings that may be performed during your third trimester include:  Blood tests that check for low iron levels (anemia).  Fetal testing to check the health, activity level, and growth of the fetus. Testing is done if you have certain medical conditions or if there are problems during the pregnancy.  Nonstress test (NST). This test checks the health of your baby to make sure there are no signs of problems, such as the baby not getting enough oxygen. During this test, a belt is placed around your belly. The baby is made to move, and its heart rate is monitored during movement. What is false labor? False labor is a condition in  which you feel small, irregular tightenings of the muscles in the womb (contractions) that usually go away with rest, changing position, or drinking water. These are called Braxton Hicks contractions. Contractions may last for hours, days, or even weeks before true labor sets in. If contractions come at regular intervals, become more frequent, increase in intensity, or become painful, you should see your health care provider. What are the signs of labor?  Abdominal cramps.  Regular contractions that start at 10 minutes apart and become stronger and more frequent with time.  Contractions that start on the top of the uterus and spread down to the lower abdomen and back.  Increased pelvic pressure and dull back pain.  A watery or bloody mucus discharge that comes from the vagina.  Leaking of amniotic fluid. This is also known as your "water breaking." It could be a slow trickle or a gush. Let your health care provider know if it has a color or strange odor. If you have any of these signs, call your health care provider right away, even if it is before your due date. Follow these instructions at home: Medicines   Follow your health care provider's instructions regarding medicine use. Specific medicines may be either safe or unsafe to take during pregnancy.  Take a prenatal vitamin that contains at least 600 micrograms (mcg) of folic acid.  If you develop constipation, try taking a stool softener if your health care provider approves. Eating and drinking   Eat a balanced diet that includes fresh fruits and vegetables, whole grains, good sources of protein such as meat, eggs, or tofu, and low-fat dairy. Your health care provider will help you determine the amount of weight gain that is right for you.  Avoid raw meat and uncooked cheese. These carry germs that can cause birth defects in the baby.  If you have low calcium intake from food, talk to your health care provider about whether you  should take a daily calcium supplement.  Eat four or five small meals rather than three large meals a day.  Limit foods that are high in fat and processed sugars, such as fried and sweet foods.  To prevent constipation:  Drink enough fluid to keep your urine clear or pale yellow.  Eat foods that are high in fiber, such as fresh fruits and vegetables, whole grains, and beans. Activity   Exercise  only as directed by your health care provider. Most women can continue their usual exercise routine during pregnancy. Try to exercise for 30 minutes at least 5 days a week. Stop exercising if you experience uterine contractions.  Avoid heavy lifting.  Do not exercise in extreme heat or humidity, or at high altitudes.  Wear low-heel, comfortable shoes.  Practice good posture.  You may continue to have sex unless your health care provider tells you otherwise. Relieving pain and discomfort   Take frequent breaks and rest with your legs elevated if you have leg cramps or low back pain.  Take warm sitz baths to soothe any pain or discomfort caused by hemorrhoids. Use hemorrhoid cream if your health care provider approves.  Wear a good support bra to prevent discomfort from breast tenderness.  If you develop varicose veins:  Wear support pantyhose or compression stockings as told by your healthcare provider.  Elevate your feet for 15 minutes, 3-4 times a day. Prenatal care   Write down your questions. Take them to your prenatal visits.  Keep all your prenatal visits as told by your health care provider. This is important. Safety   Wear your seat belt at all times when driving.  Make a list of emergency phone numbers, including numbers for family, friends, the hospital, and police and fire departments. General instructions   Avoid cat litter boxes and soil used by cats. These carry germs that can cause birth defects in the baby. If you have a cat, ask someone to clean the litter box  for you.  Do not travel far distances unless it is absolutely necessary and only with the approval of your health care provider.  Do not use hot tubs, steam rooms, or saunas.  Do not drink alcohol.  Do not use any products that contain nicotine or tobacco, such as cigarettes and e-cigarettes. If you need help quitting, ask your health care provider.  Do not use any medicinal herbs or unprescribed drugs. These chemicals affect the formation and growth of the baby.  Do not douche or use tampons or scented sanitary pads.  Do not cross your legs for long periods of time.  To prepare for the arrival of your baby:  Take prenatal classes to understand, practice, and ask questions about labor and delivery.  Make a trial run to the hospital.  Visit the hospital and tour the maternity area.  Arrange for maternity or paternity leave through employers.  Arrange for family and friends to take care of pets while you are in the hospital.  Purchase a rear-facing car seat and make sure you know how to install it in your car.  Pack your hospital bag.  Prepare the baby's nursery. Make sure to remove all pillows and stuffed animals from the baby's crib to prevent suffocation.  Visit your dentist if you have not gone during your pregnancy. Use a soft toothbrush to brush your teeth and be gentle when you floss. Contact a health care provider if:  You are unsure if you are in labor or if your water has broken.  You become dizzy.  You have mild pelvic cramps, pelvic pressure, or nagging pain in your abdominal area.  You have lower back pain.  You have persistent nausea, vomiting, or diarrhea.  You have an unusual or bad smelling vaginal discharge.  You have pain when you urinate. Get help right away if:  Your water breaks before 37 weeks.  You have regular contractions less than 5 minutes  apart before 37 weeks.  You have a fever.  You are leaking fluid from your vagina.  You have  spotting or bleeding from your vagina.  You have severe abdominal pain or cramping.  You have rapid weight loss or weight gain.  You have shortness of breath with chest pain.  You notice sudden or extreme swelling of your face, hands, ankles, feet, or legs.  Your baby makes fewer than 10 movements in 2 hours.  You have severe headaches that do not go away when you take medicine.  You have vision changes. Summary  The third trimester is from week 28 through week 40, months 7 through 9. The third trimester is a time when the unborn baby (fetus) is growing rapidly.  During the third trimester, your discomfort may increase as you and your baby continue to gain weight. You may have abdominal, leg, and back pain, sleeping problems, and an increased need to urinate.  During the third trimester your breasts will keep growing and they will continue to become tender. A yellow fluid (colostrum) may leak from your breasts. This is the first milk you are producing for your baby.  False labor is a condition in which you feel small, irregular tightenings of the muscles in the womb (contractions) that eventually go away. These are called Braxton Hicks contractions. Contractions may last for hours, days, or even weeks before true labor sets in.  Signs of labor can include: abdominal cramps; regular contractions that start at 10 minutes apart and become stronger and more frequent with time; watery or bloody mucus discharge that comes from the vagina; increased pelvic pressure and dull back pain; and leaking of amniotic fluid. This information is not intended to replace advice given to you by your health care provider. Make sure you discuss any questions you have with your health care provider. Document Released: 09/24/2001 Document Revised: 03/07/2016 Document Reviewed: 12/01/2012 Elsevier Interactive Patient Education  2017 [employer].

## 2017-02-14 NOTE — Progress Notes (Signed)
Patient reports she had fall last week- she did go to the hospital for some bleeding. She states it was just bloody show and it didn't last.

## 2017-02-15 LAB — CBC
HEMATOCRIT: 36.5 % (ref 34.0–46.6)
Hemoglobin: 12.3 g/dL (ref 11.1–15.9)
MCH: 29.6 pg (ref 26.6–33.0)
MCHC: 33.7 g/dL (ref 31.5–35.7)
MCV: 88 fL (ref 79–97)
Platelets: 218 10*3/uL (ref 150–379)
RBC: 4.15 x10E6/uL (ref 3.77–5.28)
RDW: 14.1 % (ref 12.3–15.4)
WBC: 4.5 10*3/uL (ref 3.4–10.8)

## 2017-02-15 LAB — RPR: RPR: NONREACTIVE

## 2017-02-15 LAB — GLUCOSE TOLERANCE, 2 HOURS W/ 1HR
GLUCOSE, 2 HOUR: 106 mg/dL (ref 65–152)
GLUCOSE, FASTING: 70 mg/dL (ref 65–91)
Glucose, 1 hour: 142 mg/dL (ref 65–179)

## 2017-02-15 LAB — HIV ANTIBODY (ROUTINE TESTING W REFLEX): HIV Screen 4th Generation wRfx: NONREACTIVE

## 2017-02-17 ENCOUNTER — Encounter: Payer: Self-pay | Admitting: Certified Nurse Midwife

## 2017-02-18 ENCOUNTER — Other Ambulatory Visit: Payer: Self-pay | Admitting: Certified Nurse Midwife

## 2017-02-18 DIAGNOSIS — Z3480 Encounter for supervision of other normal pregnancy, unspecified trimester: Secondary | ICD-10-CM

## 2017-02-20 ENCOUNTER — Encounter: Payer: Self-pay | Admitting: Certified Nurse Midwife

## 2017-02-28 ENCOUNTER — Encounter (HOSPITAL_COMMUNITY): Payer: Self-pay | Admitting: *Deleted

## 2017-02-28 ENCOUNTER — Inpatient Hospital Stay (HOSPITAL_COMMUNITY)
Admission: AD | Admit: 2017-02-28 | Discharge: 2017-02-28 | Disposition: A | Discharge status: Home / Self Care | Payer: Medicaid Other | Source: Ambulatory Visit | Attending: Obstetrics & Gynecology | Admitting: Obstetrics & Gynecology

## 2017-02-28 ENCOUNTER — Inpatient Hospital Stay (HOSPITAL_COMMUNITY): Payer: Medicaid Other

## 2017-02-28 ENCOUNTER — Ambulatory Visit (INDEPENDENT_AMBULATORY_CARE_PROVIDER_SITE_OTHER): Payer: Medicaid Other | Admitting: Certified Nurse Midwife

## 2017-02-28 VITALS — BP 122/76 | HR 90 | Wt 272.7 lb

## 2017-02-28 DIAGNOSIS — Z6841 Body Mass Index (BMI) 40.0 and over, adult: Secondary | ICD-10-CM

## 2017-02-28 DIAGNOSIS — Z3480 Encounter for supervision of other normal pregnancy, unspecified trimester: Secondary | ICD-10-CM

## 2017-02-28 DIAGNOSIS — R0602 Shortness of breath: Secondary | ICD-10-CM

## 2017-02-28 DIAGNOSIS — IMO0001 Reserved for inherently not codable concepts without codable children: Secondary | ICD-10-CM

## 2017-02-28 DIAGNOSIS — O99213 Obesity complicating pregnancy, third trimester: Secondary | ICD-10-CM

## 2017-02-28 DIAGNOSIS — R0789 Other chest pain: Secondary | ICD-10-CM | POA: Diagnosis not present

## 2017-02-28 DIAGNOSIS — O9989 Other specified diseases and conditions complicating pregnancy, childbirth and the puerperium: Secondary | ICD-10-CM

## 2017-02-28 DIAGNOSIS — Z3A30 30 weeks gestation of pregnancy: Secondary | ICD-10-CM

## 2017-02-28 DIAGNOSIS — R0600 Dyspnea, unspecified: Secondary | ICD-10-CM

## 2017-02-28 DIAGNOSIS — R7989 Other specified abnormal findings of blood chemistry: Secondary | ICD-10-CM

## 2017-02-28 DIAGNOSIS — Z833 Family history of diabetes mellitus: Secondary | ICD-10-CM | POA: Diagnosis not present

## 2017-02-28 DIAGNOSIS — O26893 Other specified pregnancy related conditions, third trimester: Secondary | ICD-10-CM | POA: Diagnosis not present

## 2017-02-28 DIAGNOSIS — Z3689 Encounter for other specified antenatal screening: Secondary | ICD-10-CM | POA: Diagnosis not present

## 2017-02-28 DIAGNOSIS — J069 Acute upper respiratory infection, unspecified: Secondary | ICD-10-CM | POA: Diagnosis not present

## 2017-02-28 DIAGNOSIS — Z3483 Encounter for supervision of other normal pregnancy, third trimester: Secondary | ICD-10-CM

## 2017-02-28 DIAGNOSIS — Z87891 Personal history of nicotine dependence: Secondary | ICD-10-CM | POA: Insufficient documentation

## 2017-02-28 DIAGNOSIS — Z888 Allergy status to other drugs, medicaments and biological substances status: Secondary | ICD-10-CM | POA: Insufficient documentation

## 2017-02-28 DIAGNOSIS — Z809 Family history of malignant neoplasm, unspecified: Secondary | ICD-10-CM | POA: Diagnosis not present

## 2017-02-28 DIAGNOSIS — Z79899 Other long term (current) drug therapy: Secondary | ICD-10-CM | POA: Insufficient documentation

## 2017-02-28 LAB — URINALYSIS, ROUTINE W REFLEX MICROSCOPIC
Bilirubin Urine: NEGATIVE
GLUCOSE, UA: NEGATIVE mg/dL
HGB URINE DIPSTICK: NEGATIVE
Ketones, ur: NEGATIVE mg/dL
Leukocytes, UA: NEGATIVE
Nitrite: NEGATIVE
PH: 8 (ref 5.0–8.0)
PROTEIN: NEGATIVE mg/dL
Specific Gravity, Urine: 1.006 (ref 1.005–1.030)

## 2017-02-28 NOTE — Progress Notes (Addendum)
   PRENATAL VISIT NOTE  Subjective:  Maria Morgan is a 26 y.o. G2P1001 at [redacted]w[redacted]d being seen today for ongoing prenatal care.  She is currently monitored for the following issues for this low-risk pregnancy and has Supervision of other normal pregnancy, antepartum; Back pain in pregnancy; Pituitary adenoma ([place]); Obese; Chlamydia infection affecting pregnancy in first trimester; Low vitamin D level; and Traumatic injury during pregnancy in third trimester on her problem list.  Patient reports no complaints.  Contractions: Irritability. Vag. Bleeding: None.  Movement: Present. Denies leaking of fluid.   The following portions of the patient's history were reviewed and updated as appropriate: allergies, current medications, past family history, past medical history, past social history, past surgical history and problem list. Problem list updated.  Objective:   Vitals:   02/28/17 1037  BP: 122/76  Pulse: 90  Weight: 272 lb 11.2 oz (123.7 kg)    Fetal Status: Fetal Heart Rate (bpm): 138 Fundal Height: 33 cm Movement: Present     General:  Alert, oriented and cooperative. Patient is in no acute distress.  Skin: Skin is warm and dry. No rash noted.   Cardiovascular: Normal heart rate noted  Respiratory: Normal respiratory effort, no problems with respiration noted  Abdomen: Soft, gravid, appropriate for gestational age. Pain/Pressure: Absent     Pelvic:  Cervical exam deferred        Extremities: Normal range of motion.  Edema: Trace  Mental Status: Normal mood and affect. Normal behavior. Normal judgment and thought content.   Assessment and Plan:  Pregnancy: G2P1001 at [redacted]w[redacted]d  1. Supervision of other normal pregnancy, antepartum     Doing well  2. Class 3 obesity without serious comorbidity with body mass index (BMI) of 45.0 to 49.9 in adult, unspecified obesity type ([place])     Has lost 11 lbs this pregnancy  3. Low vitamin D level     Taking weekly vitamin D  Preterm labor  symptoms and general obstetric precautions including but not limited to vaginal bleeding, contractions, leaking of fluid and fetal movement were reviewed in detail with the patient. Please refer to After Visit Summary for other counseling recommendations.  Return in about 2 weeks (around 03/14/2017) for ROB.   Morene Crocker, CNM

## 2017-02-28 NOTE — Discharge Instructions (Signed)
Upper Respiratory Infection, Adult Most upper respiratory infections (URIs) are caused by a virus. A URI affects the nose, throat, and upper air passages. The most common type of URI is often called "the common cold." Follow these instructions at home:  Take medicines only as told by your doctor.  Gargle warm saltwater or take cough drops to comfort your throat as told by your doctor.  Use a warm mist humidifier or inhale steam from a shower to increase air moisture. This may make it easier to breathe.  Drink enough fluid to keep your pee (urine) clear or pale yellow.  Eat soups and other clear broths.  Have a healthy diet.  Rest as needed.  Go back to work when your fever is gone or your doctor says it is okay.  You may need to stay home longer to avoid giving your URI to others.  You can also wear a face mask and wash your hands often to prevent spread of the virus.  Use your inhaler more if you have asthma.  Do not use any tobacco products, including cigarettes, chewing tobacco, or electronic cigarettes. If you need help quitting, ask your doctor. Contact a doctor if:  You are getting worse, not better.  Your symptoms are not helped by medicine.  You have chills.  You are getting more short of breath.  You have brown or red mucus.  You have yellow or brown discharge from your nose.  You have pain in your face, especially when you bend forward.  You have a fever.  You have puffy (swollen) neck glands.  You have pain while swallowing.  You have white areas in the back of your throat. Get help right away if:  You have very bad or constant:  Headache.  Ear pain.  Pain in your forehead, behind your eyes, and over your cheekbones (sinus pain).  Chest pain.  You have long-lasting (chronic) lung disease and any of the following:  Wheezing.  Long-lasting cough.  Coughing up blood.  A change in your usual mucus.  You have a stiff neck.  You have  changes in your:  Vision.  Hearing.  Thinking.  Mood. This information is not intended to replace advice given to you by your health care provider. Make sure you discuss any questions you have with your health care provider. Document Released: 03/18/2008 Document Revised: 06/02/2016 Document Reviewed: 01/05/2014 Elsevier Interactive Patient Education  2017 [employer]  

## 2017-02-28 NOTE — MAU Provider Note (Signed)
Chief Complaint:  Chest Pain; Back Pain; and Shortness of Breath   First Provider Initiated Contact with Patient 02/28/17 1948     HPI: Maria Morgan is a 26 y.o. G2P1001 at 54w6dwho presents to maternity admissions reporting shortness of breath, worse when she lies down, chest tightness and pain, and feels like she cannot get enough air.  Does not report headache to me. She reports good fetal movement, denies LOF, vaginal bleeding, vaginal itching/burning, urinary symptoms, h/a, dizziness, n/v, diarrhea, constipation or fever/chills.    Chest Pain   This is a new problem. The current episode started in the past 7 days. The onset quality is gradual. The problem occurs intermittently. The problem has been unchanged. The pain is present in the substernal region and lateral region. The pain is mild. The quality of the pain is described as heavy, pressure and squeezing. The pain does not radiate. Associated symptoms include a cough, palpitations and shortness of breath. Pertinent negatives include no abdominal pain, diaphoresis, dizziness, exertional chest pressure, fever, headaches, irregular heartbeat, leg pain, malaise/fatigue, nausea, sputum production, syncope or vomiting. The pain is aggravated by nothing. She has tried nothing for the symptoms. There are no known risk factors.  Shortness of Breath  This is a new problem. The current episode started 1 to 4 weeks ago. The problem occurs intermittently. The problem has been waxing and waning. Associated symptoms include chest pain. Pertinent negatives include no abdominal pain, fever, headaches, leg pain, leg swelling, rhinorrhea, sore throat, sputum production, syncope, vomiting or wheezing. The symptoms are aggravated by lying flat. The patient has no known risk factors for DVT/PE. She has tried body position changes for the symptoms. The treatment provided moderate relief.    RN Note Pt started noticing back pain (middle and between shoulders)  shortness of breath and headache about 3-4 days ago.  Says being upright helps a little bit.  Feels like she is not getting enough oxygen.  Denies LOF/VB.  Past Medical History: Past Medical History:  Diagnosis Date  . Brain tumor (benign) ([facility])   . Chlamydia   . Gonorrhea     Past obstetric history: OB History  Gravida Para Term Preterm AB Living  2 1 1     1   SAB TAB Ectopic Multiple Live Births          1    # Outcome Date GA Lbr Len/2nd Weight Sex Delivery Anes PTL Lv  2 Current           1 Term 01/09/13 [redacted]w[redacted]d 09:40 / 00:21 6 lb 15.3 oz (3.155 kg) M Vag-Spont EPI  LIV      Past Surgical History: Past Surgical History:  Procedure Laterality Date  . NO PAST SURGERIES      Family History: Family History  Problem Relation Age of Onset  . Cancer Mother   . Diabetes Paternal Grandfather     Social History: Social History  Substance Use Topics  . Smoking status: Former Smoker    Types: Cigarettes    Quit date: 10/05/2010  . Smokeless tobacco: Never Used  . Alcohol use No    Allergies:  Allergies  Allergen Reactions  . Iodine Shortness Of Breath and Itching    Contrast in IV    Meds:  Prescriptions Prior to Admission  Medication Sig Dispense Refill Last Dose  . cyclobenzaprine (FLEXERIL) 10 MG tablet Take 1 tablet (10 mg total) by mouth 3 (three) times daily as needed for muscle spasms. (Patient not taking:  Reported on 02/28/2017) 15 tablet 0 Not Taking  . Prenat w/o A Vit-FeFum-FePo-FA (CONCEPT OB) 130-92.4-1 MG CAPS Take 1 tablet by mouth daily. 30 capsule 12 Taking  . Vitamin D, Ergocalciferol, (DRISDOL) 50000 units CAPS capsule Take 1 capsule (50,000 Units total) by mouth every 7 (seven) days. (Patient not taking: Reported on 02/14/2017) 30 capsule 2 Not Taking    I have reviewed patient's Past Medical Hx, Surgical Hx, Family Hx, Social Hx, medications and allergies.   ROS:  Review of Systems  Constitutional: Negative for diaphoresis, fever and  malaise/fatigue.  HENT: Positive for congestion. Negative for rhinorrhea and sore throat.   Respiratory: Positive for cough and shortness of breath. Negative for sputum production and wheezing.   Cardiovascular: Positive for chest pain and palpitations. Negative for leg swelling and syncope.  Gastrointestinal: Negative for abdominal pain, nausea and vomiting.  Neurological: Negative for dizziness and headaches.   Other systems negative  Physical Exam  Patient Vitals for the past 24 hrs:  BP Temp Temp src Pulse Resp  02/28/17 1941 133/84 98.6 F (37 C) Oral 86 16   Constitutional: Well-developed, well-nourished female in no acute distress.  Cardiovascular: normal rate and rhythm Respiratory: normal effort, clear to auscultation bilaterally with no wheezing.   GI: Abd soft, non-tender, gravid appropriate for gestational age.   No rebound or guarding. MS: Extremities nontender, no edema, normal ROM Neurologic: Alert and oriented x 4.  GU: Neg CVAT.  PELVIC EXAM: deferred    FHT:  Baseline 140 , moderate variability, accelerations present, no decelerations Contractions:  Rare   Labs: Results for orders placed or performed during the hospital encounter of 02/28/17 (from the past 24 hour(s))  Urinalysis, Routine w reflex microscopic     Status: None   Collection Time: 02/28/17  7:39 PM  Result Value Ref Range   Color, Urine YELLOW YELLOW   APPearance CLEAR CLEAR   Specific Gravity, Urine 1.006 1.005 - 1.030   pH 8.0 5.0 - 8.0   Glucose, UA NEGATIVE NEGATIVE mg/dL   Hgb urine dipstick NEGATIVE NEGATIVE   Bilirubin Urine NEGATIVE NEGATIVE   Ketones, ur NEGATIVE NEGATIVE mg/dL   Protein, ur NEGATIVE NEGATIVE mg/dL   Nitrite NEGATIVE NEGATIVE   Leukocytes, UA NEGATIVE NEGATIVE   A/Positive/-- (12/28 1648)  Imaging:   MAU Course/MDM: I have ordered Chest xray and EKG  NST reviewed  Report given to oncoming CNM Hansel Feinstein CNM, MSN Certified  Nurse-Midwife 02/28/2017 7:55 PM  EKG nml, reviewed CXR w/Dr. Lujean Amel nml. After further discussion with pt she reports productive cough with thick mucous production and previously had sore throat. These findings are consistent with UR virus. Recommend Mucinex and Sudafed or Zyrtec. Pt appears comfortable, no respiratory distress and O2 sat 99%. Stable for discharge home.   Assessment: 1. [redacted] weeks gestation of pregnancy   2. Shortness of breath   3. Dyspnea   4. Upper respiratory virus   5. NST (non-stress test) reactive     Plan: Discharge home Follow up in OB office as scheduled Increase water intake Mucinex Sudafed or Zyrtec Rest Notify MD if sx persist or worsen  Allergies as of 02/28/2017      Reactions   Iodine Shortness Of Breath, Itching   Contrast in IV      Medication List    STOP taking these medications   cyclobenzaprine 10 MG tablet Commonly known as:  FLEXERIL   Vitamin D (Ergocalciferol) 50000 units Caps capsule Commonly known as:  DRISDOL  TAKE these medications   CONCEPT OB 130-92.4-1 MG Caps Take 1 tablet by mouth daily.      Julianne Handler, CNM  02/28/2017 10:04 PM

## 2017-02-28 NOTE — MAU Note (Signed)
Pt started noticing back pain (middle and between shoulders) shortness of breath and headache about 3-4 days ago.  Says being upright helps a little bit.  Feels like she is not getting enough oxygen.  Denies LOF/VB.

## 2017-02-28 NOTE — Patient Instructions (Addendum)
Contraception Choices Contraception (birth control) is the use of any methods or devices to prevent pregnancy. Below are some methods to help avoid pregnancy. Hormonal methods  Contraceptive implant. This is a thin, plastic tube containing progesterone hormone. It does not contain estrogen hormone. Your health care provider inserts the tube in the inner part of the upper arm. The tube can remain in place for up to 3 years. After 3 years, the implant must be removed. The implant prevents the ovaries from releasing an egg (ovulation), thickens the cervical mucus to prevent sperm from entering the uterus, and thins the lining of the inside of the uterus.  Progesterone-only injections. These injections are given every 3 months by your health care provider to prevent pregnancy. This synthetic progesterone hormone stops the ovaries from releasing eggs. It also thickens cervical mucus and changes the uterine lining. This makes it harder for sperm to survive in the uterus.  Birth control pills. These pills contain estrogen and progesterone hormone. They work by preventing the ovaries from releasing eggs (ovulation). They also cause the cervical mucus to thicken, preventing the sperm from entering the uterus. Birth control pills are prescribed by a health care provider.Birth control pills can also be used to treat heavy periods.  Minipill. This type of birth control pill contains only the progesterone hormone. They are taken every day of each month and must be prescribed by your health care provider.  Birth control patch. The patch contains hormones similar to those in birth control pills. It must be changed once a week and is prescribed by a health care provider.  Vaginal ring. The ring contains hormones similar to those in birth control pills. It is left in the vagina for 3 weeks, removed for 1 week, and then a new one is put back in place. The patient must be comfortable inserting and removing the ring from  the vagina.A health care provider's prescription is necessary.  Emergency contraception. Emergency contraceptives prevent pregnancy after unprotected sexual intercourse. This pill can be taken right after sex or up to 5 days after unprotected sex. It is most effective the sooner you take the pills after having sexual intercourse. Most emergency contraceptive pills are available without a prescription. Check with your pharmacist. Do not use emergency contraception as your only form of birth control. Barrier methods  Female condom. This is a thin sheath (latex or rubber) that is worn over the penis during sexual intercourse. It can be used with spermicide to increase effectiveness.  Female condom. This is a soft, loose-fitting sheath that is put into the vagina before sexual intercourse.  Diaphragm. This is a soft, latex, dome-shaped barrier that must be fitted by a health care provider. It is inserted into the vagina, along with a spermicidal jelly. It is inserted before intercourse. The diaphragm should be left in the vagina for 6 to 8 hours after intercourse.  Cervical cap. This is a round, soft, latex or plastic cup that fits over the cervix and must be fitted by a health care provider. The cap can be left in place for up to 48 hours after intercourse.  Sponge. This is a soft, circular piece of polyurethane foam. The sponge has spermicide in it. It is inserted into the vagina after wetting it and before sexual intercourse.  Spermicides. These are chemicals that kill or block sperm from entering the cervix and uterus. They come in the form of creams, jellies, suppositories, foam, or tablets. They do not require a prescription. They   are inserted into the vagina with an applicator before having sexual intercourse. The process must be repeated every time you have sexual intercourse. Intrauterine contraception  Intrauterine device (IUD). This is a T-shaped device that is put in a woman's uterus during  a menstrual period to prevent pregnancy. There are 2 types:  Copper IUD. This type of IUD is wrapped in copper wire and is placed inside the uterus. Copper makes the uterus and fallopian tubes produce a fluid that kills sperm. It can stay in place for 10 years.  Hormone IUD. This type of IUD contains the hormone progestin (synthetic progesterone). The hormone thickens the cervical mucus and prevents sperm from entering the uterus, and it also thins the uterine lining to prevent implantation of a fertilized egg. The hormone can weaken or kill the sperm that get into the uterus. It can stay in place for 3-5 years, depending on which type of IUD is used. Permanent methods of contraception  Female tubal ligation. This is when the woman's fallopian tubes are surgically sealed, tied, or blocked to prevent the egg from traveling to the uterus.  Hysteroscopic sterilization. This involves placing a small coil or insert into each fallopian tube. Your doctor uses a technique called hysteroscopy to do the procedure. The device causes scar tissue to form. This results in permanent blockage of the fallopian tubes, so the sperm cannot fertilize the egg. It takes about 3 months after the procedure for the tubes to become blocked. You must use another form of birth control for these 3 months.  Female sterilization. This is when the female has the tubes that carry sperm tied off (vasectomy).This blocks sperm from entering the vagina during sexual intercourse. After the procedure, the man can still ejaculate fluid (semen). Natural planning methods  Natural family planning. This is not having sexual intercourse or using a barrier method (condom, diaphragm, cervical cap) on days the woman could become pregnant.  Calendar method. This is keeping track of the length of each menstrual cycle and identifying when you are fertile.  Ovulation method. This is avoiding sexual intercourse during ovulation.  Symptothermal method.  This is avoiding sexual intercourse during ovulation, using a thermometer and ovulation symptoms.  Post-ovulation method. This is timing sexual intercourse after you have ovulated. Regardless of which type or method of contraception you choose, it is important that you use condoms to protect against the transmission of sexually transmitted infections (STIs). Talk with your health care provider about which form of contraception is most appropriate for you. This information is not intended to replace advice given to you by your health care provider. Make sure you discuss any questions you have with your health care provider. Document Released: 09/30/2005 Document Revised: 03/07/2016 Document Reviewed: 03/25/2013 Elsevier Interactive Patient Education  2017 [place] [address] [place], [place]  [address] Phone - [phone]   Fax - [phone]  [facility] [address] [place], [place] [address] Phone - [phone]   Fax - [place] [address] [place], [place]  [address] Phone - [phone]   Fax - [phone]  Curtice Toluca. [address], [place] [address] [place], [place]  [address] Phone - [phone]   Fax - [phone]  [facility] [address] [place], [place]  [address] Phone - [phone]   Fax - [phone]  [place] [address]  [place], [place]  [address] Phone - [phone]   Fax - [place] [address] [place], [place]  [address] Phone - [phone]   Fax - [phone]  [place] [address] [place], [place], [place]  [address] Phone - [phone]   Fax - [place] [address] [place], [place]  [address] Phone - [phone]    Fax - [phone] [employer] [place]ter [place]. [address] [place], [place]  [address] Phone - [phone]   Fax - [phone]  [place] [address] [place], [place]  [address] Phone - [phone]   Fax - [phone]  [facility] [place], [place], [place]  [address] Phone - [phone]   Fax - [place] [address] [place], [place]  [address] Phone - [phone]   Fax - [phone]  [facility] [address], [place], [place]  [address] Phone - [place], [place]  [address] Phone - [phone]   Fax - [place] [address], [place], [place]  [address] Phone - [phone]   Fax - [phone]  [place] [address] [place], [place]  [address] Phone - [phone]   Fax - [place]. [place], [place]  [address] Phone - [phone]   Fax - [place], [place], [place]  [address] Phone - [phone]   Fax - [place] [address], [place], [place]  [address] Phone - [phone]   Fax - [phone]  [place] [address] [place], [place]  [address] Phone - [phone]   Fax - [place] [place], [place]  [address] Phone - [phone]   Fax - [phone]  [place] [address] [place], [place]  [address] Phone - [phone]   Fax - [phone] [place] [address] [place], [place]  [address] Phone - [phone]   Fax - [place] [address] [place],   [address] Phone - [phone]   Fax -  [place] [address] [place],   [address] Phone - [phone]   Fax - [phone]  [place] MD [address] [place] [address] Phone [phone]  Fax [phone]

## 2017-03-17 ENCOUNTER — Encounter: Payer: Medicaid Other | Admitting: Certified Nurse Midwife

## 2017-03-28 ENCOUNTER — Encounter: Payer: Self-pay | Admitting: Certified Nurse Midwife

## 2017-03-28 ENCOUNTER — Ambulatory Visit (INDEPENDENT_AMBULATORY_CARE_PROVIDER_SITE_OTHER): Payer: Medicaid Other | Admitting: Certified Nurse Midwife

## 2017-03-28 DIAGNOSIS — Z3480 Encounter for supervision of other normal pregnancy, unspecified trimester: Secondary | ICD-10-CM

## 2017-03-28 DIAGNOSIS — Z23 Encounter for immunization: Secondary | ICD-10-CM | POA: Diagnosis not present

## 2017-03-28 DIAGNOSIS — Z3483 Encounter for supervision of other normal pregnancy, third trimester: Secondary | ICD-10-CM

## 2017-03-28 NOTE — Progress Notes (Signed)
   PRENATAL VISIT NOTE  Subjective:  Maria Morgan is a 26 y.o. G2P1001 at [redacted]w[redacted]d being seen today for ongoing prenatal care.  She is currently monitored for the following issues for this low-risk pregnancy and has Supervision of other normal pregnancy, antepartum; Back pain in pregnancy; Pituitary adenoma ([facility]); Obese; Chlamydia infection affecting pregnancy in first trimester; Low vitamin D level; and Traumatic injury during pregnancy in third trimester on her problem list.  Patient reports no bleeding, no leaking and occasional contractions.  Contractions: Irregular. Vag. Bleeding: None.  Movement: Present. Denies leaking of fluid.   The following portions of the patient's history were reviewed and updated as appropriate: allergies, current medications, past family history, past medical history, past social history, past surgical history and problem list. Problem list updated.  Objective:   Vitals:   03/28/17 1045  BP: 124/85  Pulse: 93  Weight: 274 lb (124.3 kg)    Fetal Status: Fetal Heart Rate (bpm): 139 Fundal Height: 35 cm Movement: Present  Presentation: Vertex  General:  Alert, oriented and cooperative. Patient is in no acute distress.  Skin: Skin is warm and dry. No rash noted.   Cardiovascular: Normal heart rate noted  Respiratory: Normal respiratory effort, no problems with respiration noted  Abdomen: Soft, gravid, appropriate for gestational age. Pain/Pressure: Absent     Pelvic:  Cervical exam performed Dilation: Fingertip Effacement (%): 0 Station: -3  Extremities: Normal range of motion.  Edema: None  Mental Status: Normal mood and affect. Normal behavior. Normal judgment and thought content.   Assessment and Plan:  Pregnancy: G2P1001 at [redacted]w[redacted]d  1. Supervision of other normal pregnancy, antepartum      Doing well.    Preterm labor symptoms and general obstetric precautions including but not limited to vaginal bleeding, contractions, leaking of fluid and fetal  movement were reviewed in detail with the patient. Please refer to After Visit Summary for other counseling recommendations.  Return in about 1 week (around 04/04/2017) for ROB, GBS.   Morene Crocker, CNM

## 2017-03-28 NOTE — Progress Notes (Signed)
Pt requests cx check today. Tdap given R del w/o difficulty.

## 2017-04-03 ENCOUNTER — Encounter: Payer: Self-pay | Admitting: Certified Nurse Midwife

## 2017-04-03 ENCOUNTER — Other Ambulatory Visit (HOSPITAL_COMMUNITY)
Admission: RE | Admit: 2017-04-03 | Discharge: 2017-04-03 | Disposition: A | Discharge status: Home / Self Care | Payer: Medicaid Other | Source: Ambulatory Visit | Attending: Certified Nurse Midwife | Admitting: Certified Nurse Midwife

## 2017-04-03 ENCOUNTER — Ambulatory Visit (INDEPENDENT_AMBULATORY_CARE_PROVIDER_SITE_OTHER): Payer: Medicaid Other | Admitting: Certified Nurse Midwife

## 2017-04-03 VITALS — BP 134/86 | HR 79 | Wt 272.6 lb

## 2017-04-03 DIAGNOSIS — J069 Acute upper respiratory infection, unspecified: Secondary | ICD-10-CM

## 2017-04-03 DIAGNOSIS — Z3483 Encounter for supervision of other normal pregnancy, third trimester: Secondary | ICD-10-CM | POA: Insufficient documentation

## 2017-04-03 DIAGNOSIS — IMO0001 Reserved for inherently not codable concepts without codable children: Secondary | ICD-10-CM

## 2017-04-03 DIAGNOSIS — Z3480 Encounter for supervision of other normal pregnancy, unspecified trimester: Secondary | ICD-10-CM

## 2017-04-03 DIAGNOSIS — R7989 Other specified abnormal findings of blood chemistry: Secondary | ICD-10-CM

## 2017-04-03 DIAGNOSIS — Z3A35 35 weeks gestation of pregnancy: Secondary | ICD-10-CM | POA: Diagnosis not present

## 2017-04-03 MED ORDER — BENZONATATE 100 MG PO CAPS: 200.0000 mg | ORAL_CAPSULE | Freq: Three times a day (TID) | ORAL | 0 refills | Status: DC | PRN

## 2017-04-03 MED ORDER — AMOXICILLIN-POT CLAVULANATE 875-125 MG PO TABS: 1.0000 | ORAL_TABLET | Freq: Two times a day (BID) | ORAL | 0 refills | Status: DC

## 2017-04-03 NOTE — Progress Notes (Signed)
   PRENATAL VISIT NOTE  Subjective:  Maria Morgan is a 26 y.o. G2P1001 at [redacted]w[redacted]d being seen today for ongoing prenatal care.  She is currently monitored for the following issues for this low-risk pregnancy and has Supervision of other normal pregnancy, antepartum; Back pain in pregnancy; Pituitary adenoma ([facility]); Obese; Chlamydia infection affecting pregnancy in first trimester; Low vitamin D level; and Traumatic injury during pregnancy in third trimester on her problem list.  Patient reports no bleeding, no contractions, no cramping, no leaking and acute URI. URI symptoms, denies fever, reports nasal congestion, cough, + sputum production  Contractions: Irritability. Vag. Bleeding: None.  Movement: Present. Denies leaking of fluid.   The following portions of the patient's history were reviewed and updated as appropriate: allergies, current medications, past family history, past medical history, past social history, past surgical history and problem list. Problem list updated.  Objective:   Vitals:   04/03/17 1459  BP: 134/86  Pulse: 79  Weight: 272 lb 9.6 oz (123.7 kg)    Fetal Status: Fetal Heart Rate (bpm): 140 Fundal Height: 37 cm Movement: Present  Presentation: Vertex  General:  Alert, oriented and cooperative. Patient is in no acute distress.  Skin: Skin is warm and dry. No rash noted.   Cardiovascular: Normal heart rate noted  Respiratory: Normal respiratory effort, no problems with respiration noted  Abdomen: Soft, gravid, appropriate for gestational age. Pain/Pressure: Absent     Pelvic:  Cervical exam performed Dilation: Closed Effacement (%): 0 Station: -3  Extremities: Normal range of motion.  Edema: Trace  Mental Status: Normal mood and affect. Normal behavior. Normal judgment and thought content.   Assessment and Plan:  Pregnancy: G2P1001 at [redacted]w[redacted]d  1. Supervision of other normal pregnancy, antepartum  - Strep Gp B NAA - Cervicovaginal ancillary only  2. Class 3  obesity without serious comorbidity with body mass index (BMI) of 45.0 to 49.9 in adult, unspecified obesity type ([place])      Has lost 11 lbs this pregnancy  3. Low vitamin D level    Taking weekly vitamin D  4. Acute URI     1+ week, green sputum production noted.  - amoxicillin-clavulanate (AUGMENTIN) 875-125 MG tablet; Take 1 tablet by mouth 2 (two) times daily.  Dispense: 14 tablet; Refill: 0 - benzonatate (TESSALON PERLES) 100 MG capsule; Take 2 capsules (200 mg total) by mouth 3 (three) times daily as needed for cough.  Dispense: 30 capsule; Refill: 0  Preterm labor symptoms and general obstetric precautions including but not limited to vaginal bleeding, contractions, leaking of fluid and fetal movement were reviewed in detail with the patient. Please refer to After Visit Summary for other counseling recommendations.  Return in about 1 week (around 04/10/2017) for [place].   Morene Crocker, CNM

## 2017-04-05 LAB — STREP GP B NAA: Strep Gp B NAA: POSITIVE — AB

## 2017-04-07 LAB — CERVICOVAGINAL ANCILLARY ONLY
Bacterial vaginitis: NEGATIVE
Candida vaginitis: NEGATIVE
Chlamydia: NEGATIVE
Neisseria Gonorrhea: NEGATIVE
Trichomonas: NEGATIVE

## 2017-04-08 ENCOUNTER — Other Ambulatory Visit: Payer: Self-pay | Admitting: Certified Nurse Midwife

## 2017-04-08 DIAGNOSIS — Z3480 Encounter for supervision of other normal pregnancy, unspecified trimester: Secondary | ICD-10-CM

## 2017-04-08 DIAGNOSIS — B951 Streptococcus, group B, as the cause of diseases classified elsewhere: Secondary | ICD-10-CM | POA: Insufficient documentation

## 2017-04-09 ENCOUNTER — Telehealth: Payer: Self-pay

## 2017-04-09 NOTE — Telephone Encounter (Signed)
ERROR

## 2017-04-10 ENCOUNTER — Encounter: Payer: Medicaid Other | Admitting: Certified Nurse Midwife

## 2017-04-21 ENCOUNTER — Encounter: Payer: Self-pay | Admitting: Certified Nurse Midwife

## 2017-04-21 ENCOUNTER — Ambulatory Visit (INDEPENDENT_AMBULATORY_CARE_PROVIDER_SITE_OTHER): Payer: Medicaid Other | Admitting: Certified Nurse Midwife

## 2017-04-21 VITALS — BP 125/78 | HR 86 | Wt 274.0 lb

## 2017-04-21 DIAGNOSIS — Z6841 Body Mass Index (BMI) 40.0 and over, adult: Secondary | ICD-10-CM

## 2017-04-21 DIAGNOSIS — R7989 Other specified abnormal findings of blood chemistry: Secondary | ICD-10-CM

## 2017-04-21 DIAGNOSIS — B951 Streptococcus, group B, as the cause of diseases classified elsewhere: Secondary | ICD-10-CM

## 2017-04-21 DIAGNOSIS — E669 Obesity, unspecified: Secondary | ICD-10-CM

## 2017-04-21 DIAGNOSIS — IMO0001 Reserved for inherently not codable concepts without codable children: Secondary | ICD-10-CM

## 2017-04-21 DIAGNOSIS — Z3483 Encounter for supervision of other normal pregnancy, third trimester: Secondary | ICD-10-CM

## 2017-04-21 DIAGNOSIS — Z3480 Encounter for supervision of other normal pregnancy, unspecified trimester: Secondary | ICD-10-CM

## 2017-04-21 DIAGNOSIS — E559 Vitamin D deficiency, unspecified: Secondary | ICD-10-CM

## 2017-04-21 NOTE — Progress Notes (Signed)
   PRENATAL VISIT NOTE  Subjective:  Maria Morgan is a 26 y.o. G2P1001 at [redacted]w[redacted]d being seen today for ongoing prenatal care.  She is currently monitored for the following issues for this low-risk pregnancy and has Supervision of other normal pregnancy, antepartum; Back pain in pregnancy; Pituitary adenoma ([place]); Obese; Chlamydia infection affecting pregnancy in first trimester; Low vitamin D level; Traumatic injury during pregnancy in third trimester; and Positive GBS test on her problem list.  Patient reports no complaints.  Contractions: Irregular. Vag. Bleeding: None.  Movement: Present. Denies leaking of fluid.   The following portions of the patient's history were reviewed and updated as appropriate: allergies, current medications, past family history, past medical history, past social history, past surgical history and problem list. Problem list updated.  Objective:   Vitals:   04/21/17 0926  BP: 125/78  Pulse: 86  Weight: 274 lb (124.3 kg)    Fetal Status: Fetal Heart Rate (bpm): 137 Fundal Height: 41 cm Movement: Present  Presentation: Vertex  General:  Alert, oriented and cooperative. Patient is in no acute distress.  Skin: Skin is warm and dry. No rash noted.   Cardiovascular: Normal heart rate noted  Respiratory: Normal respiratory effort, no problems with respiration noted  Abdomen: Soft, gravid, appropriate for gestational age. Pain/Pressure: Present     Pelvic:  Cervical exam performed Dilation: 1 Effacement (%): Thick Station: -3  Extremities: Normal range of motion.     Mental Status: Normal mood and affect. Normal behavior. Normal judgment and thought content.   Assessment and Plan:  Pregnancy: G2P1001 at [redacted]w[redacted]d  1. Supervision of other normal pregnancy, antepartum Doing well  2. Low vitamin D level     Taking vitamin D  3. Positive GBS test      PCN for labor/delivery  4. Class 3 obesity without serious comorbidity with body mass index (BMI) of 45.0 to  49.9 in adult, unspecified obesity type ([place])     Has lost 10lbs this pregnancy  Term labor symptoms and general obstetric precautions including but not limited to vaginal bleeding, contractions, leaking of fluid and fetal movement were reviewed in detail with the patient. Please refer to After Visit Summary for other counseling recommendations.  Return in about 1 week (around 04/28/2017) for [employer].   Morene Crocker, CNM

## 2017-05-01 ENCOUNTER — Telehealth (HOSPITAL_COMMUNITY): Payer: Self-pay | Admitting: *Deleted

## 2017-05-01 ENCOUNTER — Ambulatory Visit (INDEPENDENT_AMBULATORY_CARE_PROVIDER_SITE_OTHER): Payer: Medicaid Other | Admitting: Certified Nurse Midwife

## 2017-05-01 ENCOUNTER — Encounter (HOSPITAL_COMMUNITY): Payer: Self-pay | Admitting: *Deleted

## 2017-05-01 ENCOUNTER — Encounter: Payer: Self-pay | Admitting: Certified Nurse Midwife

## 2017-05-01 VITALS — BP 115/79 | HR 91 | Wt 273.6 lb

## 2017-05-01 DIAGNOSIS — E559 Vitamin D deficiency, unspecified: Secondary | ICD-10-CM | POA: Diagnosis not present

## 2017-05-01 DIAGNOSIS — R7989 Other specified abnormal findings of blood chemistry: Secondary | ICD-10-CM

## 2017-05-01 DIAGNOSIS — Z3480 Encounter for supervision of other normal pregnancy, unspecified trimester: Secondary | ICD-10-CM

## 2017-05-01 DIAGNOSIS — Z3483 Encounter for supervision of other normal pregnancy, third trimester: Secondary | ICD-10-CM

## 2017-05-01 DIAGNOSIS — B951 Streptococcus, group B, as the cause of diseases classified elsewhere: Secondary | ICD-10-CM | POA: Diagnosis not present

## 2017-05-01 NOTE — Telephone Encounter (Signed)
Preadmission screen  

## 2017-05-01 NOTE — Progress Notes (Signed)
   PRENATAL VISIT NOTE  Subjective:  Maria Morgan is a 26 y.o. G2P1001 at [redacted]w[redacted]d being seen today for ongoing prenatal care.  She is currently monitored for the following issues for this low-risk pregnancy and has Supervision of other normal pregnancy, antepartum; Back pain in pregnancy; Pituitary adenoma ([place]); Obese; Chlamydia infection affecting pregnancy in first trimester; Low vitamin D level; Traumatic injury during pregnancy in third trimester; and Positive GBS test on her problem list.  Patient reports no complaints.  Contractions: Irregular. Vag. Bleeding: None.  Movement: Present. Denies leaking of fluid.   The following portions of the patient's history were reviewed and updated as appropriate: allergies, current medications, past family history, past medical history, past social history, past surgical history and problem list. Problem list updated.  Objective:   Vitals:   05/01/17 1133  BP: 115/79  Pulse: 91  Weight: 273 lb 9.6 oz (124.1 kg)    Fetal Status: Fetal Heart Rate (bpm): 140 Fundal Height: 42 cm Movement: Present  Presentation: Vertex  General:  Alert, oriented and cooperative. Patient is in no acute distress.  Skin: Skin is warm and dry. No rash noted.   Cardiovascular: Normal heart rate noted  Respiratory: Normal respiratory effort, no problems with respiration noted  Abdomen: Soft, gravid, appropriate for gestational age.  Pain/Pressure: Absent     Pelvic: Cervical exam performed Dilation: 1.5 Effacement (%): 20 Station: -3  Extremities: Normal range of motion.  Edema: None  Mental Status:  Normal mood and affect. Normal behavior. Normal judgment and thought content.   Assessment and Plan:  Pregnancy: G2P1001 at [redacted]w[redacted]d  1. Supervision of other normal pregnancy, antepartum     Doing well, IOL scheduled.   2. Low vitamin D level      Taking weekly vitamin D  3. Positive GBS test     PCN for labor/delivery  Term labor symptoms and general obstetric  precautions including but not limited to vaginal bleeding, contractions, leaking of fluid and fetal movement were reviewed in detail with the patient. Please refer to After Visit Summary for other counseling recommendations.  Return in about 1 week (around 05/08/2017) for [facility], N[facility].   Morene Crocker, CNM

## 2017-05-01 NOTE — Progress Notes (Signed)
Patient reports good fetal movement, states that her mucous plug came out yesterday.

## 2017-05-02 ENCOUNTER — Encounter (HOSPITAL_COMMUNITY): Payer: Self-pay | Admitting: *Deleted

## 2017-05-02 ENCOUNTER — Inpatient Hospital Stay (HOSPITAL_COMMUNITY)
Admission: AD | Admit: 2017-05-02 | Discharge: 2017-05-02 | Disposition: A | Discharge status: Home / Self Care | Payer: Medicaid Other | Source: Ambulatory Visit | Attending: Obstetrics & Gynecology | Admitting: Obstetrics & Gynecology

## 2017-05-02 DIAGNOSIS — Z87891 Personal history of nicotine dependence: Secondary | ICD-10-CM | POA: Insufficient documentation

## 2017-05-02 DIAGNOSIS — Z3493 Encounter for supervision of normal pregnancy, unspecified, third trimester: Secondary | ICD-10-CM | POA: Diagnosis present

## 2017-05-02 DIAGNOSIS — O471 False labor at or after 37 completed weeks of gestation: Secondary | ICD-10-CM

## 2017-05-02 DIAGNOSIS — Z3A39 39 weeks gestation of pregnancy: Secondary | ICD-10-CM | POA: Diagnosis not present

## 2017-05-02 LAB — URINALYSIS, ROUTINE W REFLEX MICROSCOPIC
BILIRUBIN URINE: NEGATIVE
GLUCOSE, UA: NEGATIVE mg/dL
Hgb urine dipstick: NEGATIVE
KETONES UR: NEGATIVE mg/dL
LEUKOCYTES UA: NEGATIVE
Nitrite: NEGATIVE
PROTEIN: NEGATIVE mg/dL
Specific Gravity, Urine: 1.015 (ref 1.005–1.030)
pH: 7 (ref 5.0–8.0)

## 2017-05-02 NOTE — MAU Provider Note (Signed)
History     CSN: [id number]  Arrival date and time: 05/02/17 1229   None     Chief Complaint  Patient presents with  . Contractions  . blood in stool   HPI Maria Morgan is 26 y.o. G2P1001 [redacted]w[redacted]d weeks presenting for labor evaluation.  EDD is tomorrow. She reports seeing red mucous this am.  Is having irregular contractions that are painful .  + for lower back pain.  Neg for ROM. Headache chest pain and swelling. She was seen yesterday at [facility] and on exam was 1.5cm dilated. + fpr Group B Stept   Past Medical History:  Diagnosis Date  . Brain tumor (benign) ([place])   . Chlamydia   . Gonorrhea     Past Surgical History:  Procedure Laterality Date  . NO PAST SURGERIES      Family History  Problem Relation Age of Onset  . Cancer Mother   . Diabetes Paternal Grandfather     Social History  Substance Use Topics  . Smoking status: Former Smoker    Types: Cigarettes    Quit date: 10/05/2010  . Smokeless tobacco: Never Used  . Alcohol use No    Allergies:  Allergies  Allergen Reactions  . Iodine Shortness Of Breath and Itching    Contrast in IV    Prescriptions Prior to Admission  Medication Sig Dispense Refill Last Dose  . folic acid (FOLVITE) 1 MG tablet Take 1 mg by mouth daily.   05/01/2017 at Unknown time  . Prenat w/o A Vit-FeFum-FePo-FA (CONCEPT OB) 130-92.4-1 MG CAPS Take 1 tablet by mouth daily. 30 capsule 12 05/01/2017 at Unknown time  . ranitidine (ZANTAC) 150 MG capsule Take 150 mg by mouth daily as needed for heartburn.   Past Week at Unknown time  . amoxicillin-clavulanate (AUGMENTIN) 875-125 MG tablet Take 1 tablet by mouth 2 (two) times daily. (Patient not taking: Reported on 05/01/2017) 14 tablet 0 Not Taking  . benzonatate (TESSALON PERLES) 100 MG capsule Take 2 capsules (200 mg total) by mouth 3 (three) times daily as needed for cough. (Patient not taking: Reported on 05/01/2017) 30 capsule 0 Not Taking    Review of Systems  Constitutional:  Negative for activity change, appetite change, chills and fever.  Respiratory: Negative for shortness of breath.   Cardiovascular: Negative for chest pain.  Gastrointestinal: Positive for abdominal pain (irregular contractions.).  Genitourinary: Positive for vaginal discharge (bloody, red discharge.).  Musculoskeletal: Positive for back pain.   Physical Exam   Blood pressure 128/78, pulse 85, temperature 97.9 F (36.6 C), temperature source Oral, resp. rate 18, height 5\' 7"  (1.702 m), weight 272 lb (123.4 kg), last menstrual period 07/18/2016.  Physical Exam  Nursing note and vitals reviewed. Constitutional: She is oriented to person, place, and time. She appears well-developed and well-nourished. No distress.  HENT:  Head: Normocephalic.  Neck: Normal range of motion.  Cardiovascular: Normal rate.   Respiratory: Effort normal and breath sounds normal.  GI:  Gravid.  Genitourinary: There is no rash, tenderness or lesion on the right labia. There is no rash, tenderness or lesion on the left labia. Uterus is enlarged and tender (during contraction). Discharge:  for bloody, mucous show. No bleeding in the vagina. Vaginal discharge (small amount of bloody show without frank bleeding/clot) found.  Genitourinary Comments: Cervical exam by Reatha Harps RN --2cm, 40 % effaced, -3 station.  Vertex.  Musculoskeletal: She exhibits no edema.  Neurological: She is alert and oriented to person, place, and time.  Skin: Skin is warm and dry. She is not diaphoretic.  Psychiatric: She has a normal mood and affect. Her behavior is normal. Thought content normal.   Results for orders placed or performed during the hospital encounter of 05/02/17 (from the past 24 hour(s))  Urinalysis, Routine w reflex microscopic     Status: None   Collection Time: 05/02/17 12:50 PM  Result Value Ref Range   Color, Urine YELLOW YELLOW   APPearance CLEAR CLEAR   Specific Gravity, Urine 1.015 1.005 - 1.030   pH 7.0 5.0 -  8.0   Glucose, UA NEGATIVE NEGATIVE mg/dL   Hgb urine dipstick NEGATIVE NEGATIVE   Bilirubin Urine NEGATIVE NEGATIVE   Ketones, ur NEGATIVE NEGATIVE mg/dL   Protein, ur NEGATIVE NEGATIVE mg/dL   Nitrite NEGATIVE NEGATIVE   Leukocytes, UA NEGATIVE NEGATIVE   Cervical Exam by Joellen Jersey, RN prior to discharge showed no changes.   MAU Course  Procedures  MDM MSE Exam NST--Baseline 135 bpm           Mod variability           Accels           Neg for decels   Assessment and Plan  A;  [redacted]w[redacted]d gestation      False labor/Braxton Hicks contractions       Group B Strept Positive  P:  Keep scheduled appt with [facility]      Patient was instructed to return for increase in contractions, vaginal bleeding, decreased fetal movement or rupture of membranes Eve M Key 05/02/2017, 3:30 PM

## 2017-05-02 NOTE — Progress Notes (Signed)
Patient states blood and mucus were not in stool.  Had brown vaginal discharge 3 days ago, now looks more blood red when she wipes.  Describes quantity as "spotting."

## 2017-05-02 NOTE — Discharge Instructions (Signed)
Braxton Hicks Contractions °Contractions of the uterus can occur throughout pregnancy, but they are not always a sign that you are in labor. You may have practice contractions called Braxton Hicks contractions. These false labor contractions are sometimes confused with true labor. °What are Braxton Hicks contractions? °Braxton Hicks contractions are tightening movements that occur in the muscles of the uterus before labor. Unlike true labor contractions, these contractions do not result in opening (dilation) and thinning of the cervix. Toward the end of pregnancy (32-34 weeks), Braxton Hicks contractions can happen more often and may become stronger. These contractions are sometimes difficult to tell apart from true labor because they can be very uncomfortable. You should not feel embarrassed if you go to the hospital with false labor. °Sometimes, the only way to tell if you are in true labor is for your health care provider to look for changes in the cervix. The health care provider will do a physical exam and may monitor your contractions. If you are not in true labor, the exam should show that your cervix is not dilating and your water has not broken. °If there are no prenatal problems or other health problems associated with your pregnancy, it is completely safe for you to be sent home with false labor. You may continue to have Braxton Hicks contractions until you go into true labor. °How can I tell the difference between true labor and false labor? °· Differences °? False labor °? Contractions last 30-70 seconds.: Contractions are usually shorter and not as strong as true labor contractions. °? Contractions become very regular.: Contractions are usually irregular. °? Discomfort is usually felt in the top of the uterus, and it spreads to the lower abdomen and low back.: Contractions are often felt in the front of the lower abdomen and in the groin. °? Contractions do not go away with walking.: Contractions may  go away when you walk around or change positions while lying down. °? Contractions usually become more intense and increase in frequency.: Contractions get weaker and are shorter-lasting as time goes on. °? The cervix dilates and gets thinner.: The cervix usually does not dilate or become thin. °Follow these instructions at home: °· Take over-the-counter and prescription medicines only as told by your health care provider. °· Keep up with your usual exercises and follow other instructions from your health care provider. °· Eat and drink lightly if you think you are going into labor. °· If Braxton Hicks contractions are making you uncomfortable: °? Change your position from lying down or resting to walking, or change from walking to resting. °? Sit and rest in a tub of warm water. °? Drink enough fluid to keep your urine clear or pale yellow. Dehydration may cause these contractions. °? Do slow and deep breathing several times an hour. °· Keep all follow-up prenatal visits as told by your health care provider. This is important. °Contact a health care provider if: °· You have a fever. °· You have continuous pain in your abdomen. °Get help right away if: °· Your contractions become stronger, more regular, and closer together. °· You have fluid leaking or gushing from your vagina. °· You pass blood-tinged mucus (bloody show). °· You have bleeding from your vagina. °· You have low back pain that you never had before. °· You feel your baby’s head pushing down and causing pelvic pressure. °· Your baby is not moving inside you as much as it used to. °Summary °· Contractions that occur before labor are   called Braxton Hicks contractions, false labor, or practice contractions. °· Braxton Hicks contractions are usually shorter, weaker, farther apart, and less regular than true labor contractions. True labor contractions usually become progressively stronger and regular and they become more frequent. °· Manage discomfort from  Braxton Hicks contractions by changing position, resting in a warm bath, drinking plenty of water, or practicing deep breathing. °This information is not intended to replace advice given to you by your health care provider. Make sure you discuss any questions you have with your health care provider. °Document Released: 09/30/2005 Document Revised: 08/19/2016 Document Reviewed: 08/19/2016 °Elsevier Interactive Patient Education © 2017 [employer] ° ° °Fetal Movement Counts °Patient Name: ________________________________________________ Patient Due Date: ____________________ °What is a fetal movement count? °A fetal movement count is the number of times that you feel your baby move during a certain amount of time. This may also be called a fetal kick count. A fetal movement count is recommended for every pregnant woman. You may be asked to start counting fetal movements as early as week 28 of your pregnancy. °Pay attention to when your baby is most active. You may notice your baby's sleep and wake cycles. You may also notice things that make your baby move more. You should do a fetal movement count: °· When your baby is normally most active. °· At the same time each day. ° °A good time to count movements is while you are resting, after having something to eat and drink. °How do I count fetal movements? °1. Find a quiet, comfortable area. Sit, or lie down on your side. °2. Write down the date, the start time and stop time, and the number of movements that you felt between those two times. Take this information with you to your health care visits. °3. For 2 hours, count kicks, flutters, swishes, rolls, and jabs. You should feel at least 10 movements during 2 hours. °4. You may stop counting after you have felt 10 movements. °5. If you do not feel 10 movements in 2 hours, have something to eat and drink. Then, keep resting and counting for 1 hour. If you feel at least 4 movements during that hour, you may stop  counting. °Contact a health care provider if: °· You feel fewer than 4 movements in 2 hours. °· Your baby is not moving like he or she usually does. °Date: ____________ Start time: ____________ Stop time: ____________ Movements: ____________ °Date: ____________ Start time: ____________ Stop time: ____________ Movements: ____________ °Date: ____________ Start time: ____________ Stop time: ____________ Movements: ____________ °Date: ____________ Start time: ____________ Stop time: ____________ Movements: ____________ °Date: ____________ Start time: ____________ Stop time: ____________ Movements: ____________ °Date: ____________ Start time: ____________ Stop time: ____________ Movements: ____________ °Date: ____________ Start time: ____________ Stop time: ____________ Movements: ____________ °Date: ____________ Start time: ____________ Stop time: ____________ Movements: ____________ °Date: ____________ Start time: ____________ Stop time: ____________ Movements: ____________ °This information is not intended to replace advice given to you by your health care provider. Make sure you discuss any questions you have with your health care provider. °Document Released: 10/30/2006 Document Revised: 05/29/2016 Document Reviewed: 11/09/2015 °Elsevier Interactive Patient Education © 2018 [employer] ° °

## 2017-05-02 NOTE — MAU Note (Signed)
Patient presents with contractions x 3 days, blood and mucus in her stool x 3 days.  Seen by MD yesterday, 2 cm.

## 2017-05-08 ENCOUNTER — Encounter (HOSPITAL_COMMUNITY): Payer: Self-pay | Admitting: *Deleted

## 2017-05-08 ENCOUNTER — Inpatient Hospital Stay (HOSPITAL_COMMUNITY): Payer: Medicaid Other

## 2017-05-08 ENCOUNTER — Encounter: Payer: Medicaid Other | Admitting: Certified Nurse Midwife

## 2017-05-08 ENCOUNTER — Inpatient Hospital Stay (HOSPITAL_COMMUNITY)
Admission: AD | Admit: 2017-05-08 | Discharge: 2017-05-08 | Disposition: A | Discharge status: Home / Self Care | Payer: Medicaid Other | Source: Ambulatory Visit | Attending: Obstetrics and Gynecology | Admitting: Obstetrics and Gynecology

## 2017-05-08 DIAGNOSIS — Z87891 Personal history of nicotine dependence: Secondary | ICD-10-CM | POA: Diagnosis not present

## 2017-05-08 DIAGNOSIS — Z8619 Personal history of other infectious and parasitic diseases: Secondary | ICD-10-CM | POA: Insufficient documentation

## 2017-05-08 DIAGNOSIS — Z91041 Radiographic dye allergy status: Secondary | ICD-10-CM | POA: Diagnosis not present

## 2017-05-08 DIAGNOSIS — Z86011 Personal history of benign neoplasm of the brain: Secondary | ICD-10-CM | POA: Insufficient documentation

## 2017-05-08 LAB — TYPE AND SCREEN
ABO/RH(D): A POS
Antibody Screen: NEGATIVE

## 2017-05-08 LAB — CBC
HEMATOCRIT: 36.4 % (ref 36.0–46.0)
Hemoglobin: 12.7 g/dL (ref 12.0–15.0)
MCH: 30.2 pg (ref 26.0–34.0)
MCHC: 34.9 g/dL (ref 30.0–36.0)
MCV: 86.5 fL (ref 78.0–100.0)
PLATELETS: 214 10*3/uL (ref 150–400)
RBC: 4.21 MIL/uL (ref 3.87–5.11)
RDW: 12.9 % (ref 11.5–15.5)
WBC: 5.4 10*3/uL (ref 4.0–10.5)

## 2017-05-08 MED ORDER — LACTATED RINGERS IV SOLN
INTRAVENOUS | Status: DC
  Administered 2017-05-08: 13:00:00 via INTRAVENOUS

## 2017-05-08 MED ORDER — AMOXICILLIN-POT CLAVULANATE 875-125 MG PO TABS: 1.0000 | ORAL_TABLET | Freq: Two times a day (BID) | ORAL | 0 refills | Status: AC

## 2017-05-08 MED ORDER — KETOROLAC TROMETHAMINE 15 MG/ML IJ SOLN
15.0000 mg | Freq: Once | INTRAMUSCULAR | Status: AC
  Administered 2017-05-08: 15 mg via INTRAVENOUS
  Filled 2017-05-08: qty 1

## 2017-05-08 MED ORDER — HYDROMORPHONE HCL 1 MG/ML IJ SOLN
1.0000 mg | Freq: Once | INTRAMUSCULAR | Status: AC
  Administered 2017-05-08: 1 mg via INTRAVENOUS
  Filled 2017-05-08: qty 1

## 2017-05-08 NOTE — MAU Note (Signed)
Pt delivered on Friday at [facility](water broke closes hospital). Pt stated somehting is "hanging" out of her vaginal. Can't sit. C/O pain and burning.

## 2017-05-08 NOTE — Progress Notes (Signed)
Patient requested use of a breast pump.  Double electric set up and education done about pumping and parts

## 2017-05-08 NOTE — Discharge Instructions (Signed)
Postpartum Hemorrhage °Postpartum hemorrhage is excessive blood loss after childbirth. Vaginal bleeding after delivery is normal and should be expected. Bleeding (lochia) will occur for several days after childbirth. This can be expected with normal vaginal deliveries and cesarean deliveries. However, postpartum hemorrhage is a potentially serious condition. °What are the causes? °This condition is caused by: °· A loss of muscle tone in the uterus after childbirth. This can be caused by: °? An abnormal placenta. °? Infection. °? Bladder swelling (distension). °· Failure to deliver all of the placenta or the retention of clots. °· Wounds in the birth canal caused by delivery of the fetus. °· Infection of the uterus. °· Infection of tissue around the fetus. °· A tear in the uterus. °· Tearing of the vagina or cervix during delivery. °· A maternal bleeding disorder that prevents blood from clotting (rare). ° °What increases the risk? °This condition is likely to develop in people who: °· Have a history of postpartum hemorrhage. °· Had a delivery that lasted longer than usual. °· Have an excess of amniotic fluid in the amniotic sac (polyhydramnios), leading the uterus to stretch too much. °· Have delivered quintuplets or more babies. °· Had high blood pressure, seizures, or coma during pregnancy. °· Had a condition called preeclampsia or eclampsia during pregnancy. °· Had problems with the placenta. °· Had complications during labor or delivery. °· Are obese. °· Are 40 years old or older. °· Are Asian or Hispanic. ° °What are the signs or symptoms? °Symptoms of this condition include: °· Passing large clots or pieces of tissue. These may be small pieces of placenta left after delivery. °· Soaking more than one sanitary pad per hour for several hours. °· Heavy, bright-red bleeding that occurs 4 days or more after delivery. °· Discharge that has a bad smell. °· An unexplained fever. °· Nausea or vomiting. °· Pain or  swelling near the vagina or perineum. °· A drop in blood pressure. °· Lightheadedness or fainting. °· Shortness of breath. °· A fast heart rate that happens with very little activity. °· Signs of shock, such as: °? Blurry vision. °? Chills. °? Dizziness. °? Weakness. ° °How is this diagnosed? °This condition may be diagnosed based on: °· Your symptoms. °· A physical exam of your perineum, vagina, cervix, and uterus. °· Tests, including: °? Blood pressure and pulse measurements. °? Blood tests. °? Blood clotting tests. °? Ultrasonography. ° °How is this treated? °Treatment for this condition depends on the severity of your symptoms. It may include: °· Uterine massage. °· Medicines to help the uterus contract. °· Blood transfusions. °· Fluids given through the vein. °· A medical procedure to compress arteries supplying the uterus. ° °Sometimes bleeding occurs if portions of the placenta are left behind in the uterus after delivery. If this happens, a curettage or scraping of the inside of the uterus must be done (rare). This usually stops the bleeding. If curettage does not stop the bleeding, surgery may be done to remove the uterus (hysterectomy), but this rarely occurs. °If bleeding is due to clotting or bleeding problems that are not related to the pregnancy, other treatments may be needed. °Follow these instructions at home: °· Limit your activity as directed by your health care provider. Your health care provider may order bed rest (getting up to go to the bathroom only) or may allow you to continue light activity. °· Keep track of the number of pads you use each day and how soaked (saturated) they are. Write down   this information. °· Do not use tampons. °· Do not douche or have sexual intercourse until your health care provider approves. °· Drink enough fluids to keep your urine clear or pale yellow. °· Get enough rest. °· Eat foods that are rich in iron, such as spinach, red meat, and legumes. °· Take any  over-the-counter and prescription medicines only as told by your health care provider. °· Keep all follow-up visits as told by your health care provider. This is important. °Get help right away if: °· You experience severe cramps in your stomach, back, or abdomen. °· You have a fever. °· You pass large clots or tissue. Save any tissue for your health care provider to look at. °· Your bleeding increases. °· You have heavy bleeding that soaks one pad per hour for 2 hours in a row. °· You faint or become dizzy, weak, or lightheaded. °· Your sanitary pad count per hour is increasing. °· You are urinating less than usual or not at all. °· You have shortness of breath. °· Your heart rate is faster than usual. °· You have sudden chest pain. °This information is not intended to replace advice given to you by your health care provider. Make sure you discuss any questions you have with your health care provider. °Document Released: 12/21/2003 Document Revised: 05/29/2016 Document Reviewed: 05/03/2016 °Elsevier Interactive Patient Education © 2018 [employer]. ° °

## 2017-05-08 NOTE — MAU Provider Note (Signed)
Chief Complaint: Postpartum Complications   SUBJECTIVE HPI: Maria Morgan is a 26 y.o. [id number] who is 6 days postpartum from vaginal delivery at [facility]. Patient states that she has been having cramping since delivery but got really bad today after she got up this morning and went to the bathroom. Patient states that after using the bathroom she noticed blood clots from her vagina. She also mentions that something is hanging from her vagina. She has not taken anything for the pain. She is breastfeeding her newborn. Denies any large amounts of bleeding or blood clots. Denies fevers.    Past Medical History:  Diagnosis Date  . Brain tumor (benign) ([place])   . Chlamydia   . Gonorrhea    OB History  Gravida Para Term Preterm AB Living  2 1 1     1   SAB TAB Ectopic Multiple Live Births          1    # Outcome Date GA Lbr Len/2nd Weight Sex Delivery Anes PTL Lv  2 Gravida           1 Term 01/09/13 [redacted]w[redacted]d 09:40 / 00:21 6 lb 15.3 oz (3.155 kg) M Vag-Spont EPI  LIV     Past Surgical History:  Procedure Laterality Date  . NO PAST SURGERIES     Social History   Social History  . Marital status: Single    Spouse name: N/A  . Number of children: N/A  . Years of education: N/A   Occupational History  . Not on file.   Social History Main Topics  . Smoking status: Former Smoker    Types: Cigarettes    Quit date: 10/05/2010  . Smokeless tobacco: Never Used  . Alcohol use No  . Drug use: No  . Sexual activity: Yes    Birth control/ protection: None   Other Topics Concern  . Not on file   Social History Narrative  . No narrative on file   No current facility-administered medications on file prior to encounter.    Current Outpatient Prescriptions on File Prior to Encounter  Medication Sig Dispense Refill  . folic acid (FOLVITE) 1 MG tablet Take 1 mg by mouth daily.    Maria Morgan w/o A Vit-FeFum-FePo-FA (CONCEPT OB) 130-92.4-1 MG CAPS Take 1 tablet by mouth daily. 30 capsule 12  .  ranitidine (ZANTAC) 150 MG capsule Take 150 mg by mouth daily as needed for heartburn.     Allergies  Allergen Reactions  . Iodine Shortness Of Breath and Itching    Contrast in IV    I have reviewed the past Medical Hx, Surgical Hx, Social Hx, Allergies and Medications.   REVIEW OF SYSTEMS All systems reviewed and are negative for acute change except as noted in the HPI.   OBJECTIVE Patient Vitals for the past 24 hrs:  BP Temp Pulse Resp  05/08/17 1226 128/79 98.8 F (37.1 C) (!) 110 18    PHYSICAL EXAM Constitutional: Well-developed, well-nourished female in no acute distress.  Cardiovascular: tachycardia, normal rhythm Respiratory: normal rate and effort.  GI: Abd soft, lower abdomen tenderness, non-distended. Fundus firm at umbilicus MS: Extremities nontender, no edema, normal ROM Neurologic: Alert and oriented x 4.  GU: placental membranes hanging from vaginal introitus with blood clots. NEFG SPECULUM EXAM: vaginal mucous normal, cervix with dark blood clot and membranes at external os  LAB RESULTS Results for orders placed or performed during the hospital encounter of 05/08/17 (from the past 24 hour(s))  CBC  Status: None   Collection Time: 05/08/17  1:05 PM  Result Value Ref Range   WBC 5.4 4.0 - 10.5 K/uL   RBC 4.21 3.87 - 5.11 MIL/uL   Hemoglobin 12.7 12.0 - 15.0 g/dL   HCT 36.4 36.0 - 46.0 %   MCV 86.5 78.0 - 100.0 fL   MCH 30.2 26.0 - 34.0 pg   MCHC 34.9 30.0 - 36.0 g/dL   RDW 12.9 11.5 - 15.5 %   Platelets 214 150 - 400 K/uL  Type and screen [place]     Status: None   Collection Time: 05/08/17  1:05 PM  Result Value Ref Range   ABO/RH(D) A POS    Antibody Screen NEG    Sample Expiration 05/11/2017     IMAGING US Pelvis Complete  Result Date: 05/08/2017 CLINICAL DATA:  Retained products of conception, 6 days postpartum EXAM: TRANSABDOMINAL ULTRASOUND OF PELVIS TECHNIQUE: Transabdominal ultrasound examination of the pelvis  was performed including evaluation of the uterus, ovaries, adnexal regions, and pelvic cul-de-sac. Transvaginal imaging was not performed COMPARISON:  None FINDINGS: Uterus Measurements: 16.1 x 9.3 x 9.9 cm. Enlarged, compatible with recent postpartum state. No focal mass. Endometrium Thickness: 13 mm thick. Mildly heterogeneous in appearance with minimal endometrial fluid. No focal mass. Right ovary Measurements:  3.0 x 1.8 x 2.2 cm.  Normal morphology without mass Left ovary Measurements: 2.4 x 1.3 x 1.8 cm. Normal morphology without mass Other findings:  No free pelvic fluid or adnexal masses. IMPRESSION: Enlarged postpartum uterus with mildly heterogeneous endometrial complex associated with minimal endometrial fluid. No definite endometrial mass is identified to suggest retained products of conception. Electronically Signed   By: Maria Morgan M.D.   On: 05/08/2017 15:42    MAU COURSE Start IV - LR at [facility] obtained per orders - no signs of infection, no anemia Vitals are stable other than a little tachycardia Toradol given for pain and cramping 13:51 - evaluated by Dr. Rip Harbour and excess membranes removed; [place] ordered; pain medication given 15:55 - [place] without signs of retained products  MDM Plan of care reviewed with patient, including labs and tests ordered and medical treatment.   ASSESSMENT 1. Retained products of conception, postpartum     PLAN All products removed with speculum examination Rx for Augmentin given for prophylaxis Ibuprofen as needed for pain and cramping Warning signs of excessive bleeding and infection discussed Encouraged patient to keep PP visit Handout given Stable for discharge home   Maria Blare, DO [facility], [place] 05/08/2017, 12:33 PM   Pt seen and examined. History as noted above. PE: Speculum placed, small amount of membranes and blood clot noted at os and easily removed with ring forceps. No active  bleeding noted. Reminder of MAU visit as noted.  Maria Robes, MD OB/GYN faculty

## 2017-05-10 ENCOUNTER — Inpatient Hospital Stay (HOSPITAL_COMMUNITY): Admission: RE | Admit: 2017-05-10 | Payer: Medicaid Other | Source: Ambulatory Visit

## 2017-05-11 ENCOUNTER — Emergency Department (HOSPITAL_BASED_OUTPATIENT_CLINIC_OR_DEPARTMENT_OTHER)
Admission: EM | Admit: 2017-05-11 | Discharge: 2017-05-11 | Disposition: A | Discharge status: Home / Self Care | Payer: Medicaid Other | Attending: Emergency Medicine | Admitting: Emergency Medicine

## 2017-05-11 ENCOUNTER — Encounter (HOSPITAL_BASED_OUTPATIENT_CLINIC_OR_DEPARTMENT_OTHER): Payer: Self-pay | Admitting: *Deleted

## 2017-05-11 DIAGNOSIS — R42 Dizziness and giddiness: Secondary | ICD-10-CM | POA: Insufficient documentation

## 2017-05-11 DIAGNOSIS — Z87891 Personal history of nicotine dependence: Secondary | ICD-10-CM | POA: Insufficient documentation

## 2017-05-11 DIAGNOSIS — N939 Abnormal uterine and vaginal bleeding, unspecified: Secondary | ICD-10-CM

## 2017-05-11 LAB — WET PREP, GENITAL
Clue Cells Wet Prep HPF POC: NONE SEEN
SPERM: NONE SEEN
TRICH WET PREP: NONE SEEN
YEAST WET PREP: NONE SEEN

## 2017-05-11 LAB — URINALYSIS, ROUTINE W REFLEX MICROSCOPIC
Bilirubin Urine: NEGATIVE
GLUCOSE, UA: NEGATIVE mg/dL
Hgb urine dipstick: NEGATIVE
Ketones, ur: NEGATIVE mg/dL
LEUKOCYTES UA: NEGATIVE
NITRITE: NEGATIVE
PROTEIN: NEGATIVE mg/dL
Specific Gravity, Urine: 1.02 (ref 1.005–1.030)
pH: 5.5 (ref 5.0–8.0)

## 2017-05-11 LAB — CBC WITH DIFFERENTIAL/PLATELET
BASOS ABS: 0 10*3/uL (ref 0.0–0.1)
Basophils Relative: 0 %
EOS PCT: 3 %
Eosinophils Absolute: 0.2 10*3/uL (ref 0.0–0.7)
HEMATOCRIT: 35 % — AB (ref 36.0–46.0)
Hemoglobin: 12.2 g/dL (ref 12.0–15.0)
LYMPHS ABS: 1.4 10*3/uL (ref 0.7–4.0)
LYMPHS PCT: 31 %
MCH: 30.3 pg (ref 26.0–34.0)
MCHC: 34.9 g/dL (ref 30.0–36.0)
MCV: 86.8 fL (ref 78.0–100.0)
MONO ABS: 0.4 10*3/uL (ref 0.1–1.0)
MONOS PCT: 10 %
Neutro Abs: 2.5 10*3/uL (ref 1.7–7.7)
Neutrophils Relative %: 56 %
PLATELETS: 224 10*3/uL (ref 150–400)
RBC: 4.03 MIL/uL (ref 3.87–5.11)
RDW: 12.3 % (ref 11.5–15.5)
WBC: 4.5 10*3/uL (ref 4.0–10.5)

## 2017-05-11 LAB — BASIC METABOLIC PANEL
ANION GAP: 8 (ref 5–15)
BUN: 9 mg/dL (ref 6–20)
CO2: 25 mmol/L (ref 22–32)
Calcium: 8.7 mg/dL — ABNORMAL LOW (ref 8.9–10.3)
Chloride: 105 mmol/L (ref 101–111)
Creatinine, Ser: 0.82 mg/dL (ref 0.44–1.00)
GFR calc Af Amer: >60 mL/min (ref >60)
GLUCOSE: 91 mg/dL (ref 65–99)
POTASSIUM: 3.6 mmol/L (ref 3.5–5.1)
Sodium: 138 mmol/L (ref 135–145)

## 2017-05-11 NOTE — ED Provider Notes (Signed)
[place] DEPT [facility] Provider Note   CSN: [id number] Arrival date & time: 05/11/17  1503  By signing my name below, I, Maria Morgan, attest that this documentation has been prepared under the direction and in the presence of Providence Lanius, PA-C. Electronically Signed: Dora Morgan, Scribe. 05/11/2017. 4:36 PM.  History   Chief Complaint Chief Complaint  Patient presents with  . Postpartum Complications   The history is provided by the patient. No language interpreter was used.    HPI Comments: Maria Morgan is a 26 y.o. female who presents to the Emergency Department for evaluation of a postpartum complication. She gave birth on 7/20 and was discharged from the hospital on 7/23. The birth was a full-term and normal vaginal delivery. Patient has been passing clots of placenta in her urine since giving birth. She was seen three days ago at [facility] and was started on amoxicillin for prophylaxis. She experienced some relief for two days but passed a large clot last night and again today. Patient has had some intermittent supapubic and vaginal pain since giving birth. She has had some persistent vaginal bleeding as well and has been using 5-6 pads a day. She notes that the pads have not been fully saturated with blood. Patient had a transient episode of dizziness this morning without any LOC or subsequent episodes. She is breast feeding. There have been no additional postpartum complications. She denies nausea, vomiting, chest pain, dyspnea, fevers, chills, dysuria, leg swelling, or any other associated symptoms. Patient is followed by [facility].  Past Medical History:  Diagnosis Date  . Brain tumor (benign) ([place])   . Chlamydia   . Gonorrhea     Patient Active Problem List   Diagnosis Date Noted  . Positive GBS test 04/08/2017  . Traumatic injury during pregnancy in third trimester 02/14/2017  . Low vitamin D level 10/21/2016  . Chlamydia infection affecting pregnancy  in first trimester 10/17/2016  . Pituitary adenoma ([place]) 10/10/2016  . Obese 10/10/2016  . Back pain in pregnancy 10/06/2012  . Supervision of other normal pregnancy, antepartum 09/09/2012    Past Surgical History:  Procedure Laterality Date  . NO PAST SURGERIES      OB History    Gravida Para Term Preterm AB Living   2 2 2     2    SAB TAB Ectopic Multiple Live Births           2       Home Medications    Prior to Admission medications   Medication Sig Start Date End Date Taking? Authorizing Provider  amoxicillin-clavulanate (AUGMENTIN) 875-125 MG tablet Take 1 tablet by mouth 2 (two) times daily. 05/08/17 05/13/17 Yes Phelps, Lavonda Jumbo, DO  Prenat w/o A Vit-FeFum-FePo-FA (CONCEPT OB) 130-92.4-1 MG CAPS Take 1 tablet by mouth daily. 10/23/16  Yes Smith, [place], CNM  folic acid (FOLVITE) 1 MG tablet Take 1 mg by mouth daily.    [provider]    Family History Family History  Problem Relation Age of Onset  . Cancer Mother   . Diabetes Paternal Grandfather     Social History Social History  Substance Use Topics  . Smoking status: Former Smoker    Types: Cigarettes    Quit date: 10/05/2010  . Smokeless tobacco: Never Used  . Alcohol use No     Allergies   Iodine   Review of Systems Review of Systems  Constitutional: Negative for chills and fever.  Respiratory: Negative for cough and shortness of  breath.   Cardiovascular: Negative for chest pain and leg swelling.  Gastrointestinal: Positive for abdominal pain. Negative for nausea and vomiting.  Genitourinary: Positive for vaginal bleeding and vaginal pain. Negative for dysuria.  Neurological: Positive for dizziness. Negative for syncope and headaches.   Physical Exam Updated Vital Signs BP 125/71 (BP Location: Left Arm)   Pulse 86   Temp 98.3 F (36.8 C) (Oral)   Resp 18   Ht 5\' 7"  (1.702 m)   Wt 258 lb (117 kg)   SpO2 98%   BMI 40.41 kg/m   Physical Exam  Constitutional: She is oriented  to person, place, and time. She appears well-developed and well-nourished. No distress.  Sitting comfortably on examination table  HENT:  Head: Normocephalic and atraumatic.  Eyes: Conjunctivae and EOM are normal.  Neck: Neck supple. No tracheal deviation present.  Cardiovascular: Normal rate, regular rhythm, normal heart sounds and intact distal pulses.   Pulmonary/Chest: Effort normal and breath sounds normal. No respiratory distress.  Abdominal: Soft. There is tenderness in the suprapubic area. There is no CVA tenderness.  Mild suprapubic tenderness. Abdomen is not gravid. Uterus is soft and appropriately sized.  Genitourinary: Uterus normal. Uterus is not enlarged and not tender. Cervix exhibits no motion tenderness and no friability. Right adnexum displays no mass and no tenderness. Left adnexum displays no mass and no tenderness. There is bleeding in the vagina.  Genitourinary Comments: The exam was performed with a chaperone present. Normal external female genitalia. Minimal vaginal bleeding. No evidence of tissue or clots noted. No evidence of products of conception. Cervix is closed. No right adnexal mass or tenderness. No left adnexal mass or tenderness. Normal uterus.   Musculoskeletal: Normal range of motion.  Neurological: She is alert and oriented to person, place, and time.  Skin: Skin is warm and dry.  Psychiatric: She has a normal mood and affect. Her behavior is normal.  Nursing note and vitals reviewed.  ED Treatments / Results  Labs (all labs ordered are listed, but only abnormal results are displayed) Labs Reviewed  CBC WITH DIFFERENTIAL/PLATELET  BASIC METABOLIC PANEL  URINALYSIS, ROUTINE W REFLEX MICROSCOPIC    EKG  EKG Interpretation None       Radiology No results found.  Procedures Procedures (including critical care time)  DIAGNOSTIC STUDIES: Oxygen Saturation is 98% on RA, normal by my interpretation.    COORDINATION OF CARE: 4:30 PM Discussed  treatment plan with pt at bedside and pt agreed to plan.  Medications Ordered in ED Medications - No data to display   Initial Impression / Assessment and Plan / ED Course  I have reviewed the triage vital signs and the nursing notes.  Pertinent labs & imaging results that were available during my care of the patient were reviewed by me and considered in my medical decision making (see chart for details).     26 year old female who is 9 days postpartum who presents with vaginal bleeding with clots. Patient was seen on 7/26 for evaluations of passage of clots. She was diagnosed with retain products of conception. She was started on amoxicillin and discharge home. Patient is afebrile, non-toxic appearing, sitting comfortably on examination table. Vital signs reviewed and stable.  Consider retained products vs normal post-partum bleeding. Plan to check CBC, BMP, UA. Will evaluate with pelvic in the department.  CBC reviewed and is unremarkable. Hemoglobin and hematocrit are stable. UA is normal without any signs of infection. BMP is without any abnormalities. Pelvic exam shows vaginal  bleeding but no presence of clots noted. Patient was able to urinate in the department without any bleeding or clots. Patient's vitals are stable at this time. Patient stable for discharge at this time. Plan outpatient follow-up with her GYN tomorrow for further evaluation. Instructed patient continue taking her antibiotics and pain medication as needed for pain. Strict return precautions discussed. Patient was understanding and agreement to plan.   Final Clinical Impressions(s) / ED Diagnoses   Final diagnoses:  Vaginal bleeding  Postpartum hemorrhage, unspecified type    New Prescriptions New Prescriptions   No medications on file   I personally performed the services described in this documentation, which was scribed in my presence. The recorded information has been reviewed and is accurate.    Volanda Napoleon, PA-C 05/12/17 2211    Dorie Rank, MD 05/13/17 [phone]

## 2017-05-11 NOTE — Discharge Instructions (Addendum)
Call your OB/GYN tomorrow and arrange for an appointment to be seen.  Finished taking the antibiotics as directed.  Return for any worsening symptoms, passage of more clots, worsening bleeding, fever, or any other worsening or concerning symptoms return to the [facility] or the Emergency Department for further evaluation

## 2017-05-11 NOTE — ED Triage Notes (Addendum)
Pt is 9 days post-partum. Reports being seen at [facility] this past Thursday and states she was put on antibiotics d/t pt still passing parts of placenta. Pt states she was instructed to return if she continued to pass clots. Reports passing 1 clot last night and 1 this morning. Reports only small amount of vaginal bleeding. Reports transient dizziness this am. Denies fever. Reports uncomplicated vaginal delivery.

## 2017-05-11 NOTE — ED Notes (Addendum)
Per PA, EMT was allowed to give patient graham crackers and ginger ale.

## 2017-05-12 LAB — GC/CHLAMYDIA PROBE AMP (~~LOC~~) NOT AT ARMC
CHLAMYDIA, DNA PROBE: NEGATIVE
Neisseria Gonorrhea: NEGATIVE

## 2017-05-20 ENCOUNTER — Ambulatory Visit: Payer: Medicaid Other | Admitting: Obstetrics and Gynecology

## 2018-01-19 ENCOUNTER — Encounter (HOSPITAL_BASED_OUTPATIENT_CLINIC_OR_DEPARTMENT_OTHER): Payer: Self-pay | Admitting: *Deleted

## 2018-01-19 ENCOUNTER — Emergency Department (HOSPITAL_BASED_OUTPATIENT_CLINIC_OR_DEPARTMENT_OTHER): Payer: Self-pay

## 2018-01-19 ENCOUNTER — Emergency Department (HOSPITAL_BASED_OUTPATIENT_CLINIC_OR_DEPARTMENT_OTHER)
Admission: EM | Admit: 2018-01-19 | Discharge: 2018-01-19 | Disposition: A | Discharge status: Home / Self Care | Payer: Self-pay | Attending: Emergency Medicine | Admitting: Emergency Medicine

## 2018-01-19 ENCOUNTER — Other Ambulatory Visit: Payer: Self-pay

## 2018-01-19 DIAGNOSIS — Y998 Other external cause status: Secondary | ICD-10-CM | POA: Insufficient documentation

## 2018-01-19 DIAGNOSIS — Y929 Unspecified place or not applicable: Secondary | ICD-10-CM | POA: Insufficient documentation

## 2018-01-19 DIAGNOSIS — S99922A Unspecified injury of left foot, initial encounter: Secondary | ICD-10-CM | POA: Insufficient documentation

## 2018-01-19 DIAGNOSIS — Z87891 Personal history of nicotine dependence: Secondary | ICD-10-CM | POA: Insufficient documentation

## 2018-01-19 DIAGNOSIS — Y9389 Activity, other specified: Secondary | ICD-10-CM | POA: Insufficient documentation

## 2018-01-19 DIAGNOSIS — Z79899 Other long term (current) drug therapy: Secondary | ICD-10-CM | POA: Insufficient documentation

## 2018-01-19 DIAGNOSIS — W19XXXA Unspecified fall, initial encounter: Secondary | ICD-10-CM

## 2018-01-19 DIAGNOSIS — Y92009 Unspecified place in unspecified non-institutional (private) residence as the place of occurrence of the external cause: Secondary | ICD-10-CM

## 2018-01-19 DIAGNOSIS — W109XXA Fall (on) (from) unspecified stairs and steps, initial encounter: Secondary | ICD-10-CM | POA: Insufficient documentation

## 2018-01-19 MED ORDER — HYDROCODONE-ACETAMINOPHEN 5-325 MG PO TABS: 1.0000 | ORAL_TABLET | Freq: Four times a day (QID) | ORAL | 0 refills | Status: DC | PRN

## 2018-01-19 MED FILL — HYDROCODON-APAP 5-325: 5-325 | 3 days supply | Qty: 10 | Fill #0

## 2018-01-19 NOTE — ED Triage Notes (Signed)
She tripped over a toy and fell down 14 steps 2 days ago. Pain in her left foot.

## 2018-01-19 NOTE — Discharge Instructions (Signed)
Take Tylenol for mild pain or the pain medicine prescribed for bad pain.  Do not take Tylenol together with the pain medicine prescribed as combination can be dangerous to your liver.  Special she was needed for comfort.  If you have significant pain by Friday, 01/21/2018 call Dr. Barbaraann Barthel to schedule an office visit.

## 2018-01-19 NOTE — ED Notes (Signed)
ED Provider at bedside. 

## 2018-01-19 NOTE — ED Provider Notes (Signed)
[place] [facility] EMERGENCY DEPARTMENT Provider Note   CSN: [id number] Arrival date & time: 01/19/18  1354     History   Chief Complaint Chief Complaint  Patient presents with  . Fall  . Foot Injury    HPI Maria Morgan is a 27 y.o. female.  Patient fell down 13 or 14 steps 2 nights ago injuring her left foot.  No other injury.  She is been treating herself with Tylenol and ibuprofen with partial relief.  She is been ambulatory since the event.  No loss of consciousness no pain other than in left foot.  Pain is worse with weightbearing and improved with nonweightbearing presently is mild.  Treat herself with ibuprofen at 9 AM today.  HPI  Past Medical History:  Diagnosis Date  . Brain tumor (benign) ([place])   . Chlamydia   . Gonorrhea     Patient Active Problem List   Diagnosis Date Noted  . Positive GBS test 04/08/2017  . Traumatic injury during pregnancy in third trimester 02/14/2017  . Low vitamin D level 10/21/2016  . Chlamydia infection affecting pregnancy in first trimester 10/17/2016  . Pituitary adenoma ([place]) 10/10/2016  . Obese 10/10/2016  . Back pain in pregnancy 10/06/2012  . Supervision of other normal pregnancy, antepartum 09/09/2012    Past Surgical History:  Procedure Laterality Date  . NO PAST SURGERIES       OB History    Gravida  2   Para  2   Term  2   Preterm      AB      Living  2     SAB      TAB      Ectopic      Multiple      Live Births  2            Home Medications    Prior to Admission medications   Medication Sig Start Date End Date Taking? Authorizing Provider  folic acid (FOLVITE) 1 MG tablet Take 1 mg by mouth daily.    [provider]  HYDROcodone-acetaminophen (NORCO) 5-325 MG tablet Take 1 tablet by mouth every 6 (six) hours as needed for severe pain. 01/19/18   Orlie Dakin, MD  Prenat w/o A Vit-FeFum-FePo-FA (CONCEPT OB) 130-92.4-1 MG CAPS Take 1 tablet by mouth daily. 10/23/16   [name], CNM    Family History Family History  Problem Relation Age of Onset  . Cancer Mother   . Diabetes Paternal Grandfather     Social History Social History   Tobacco Use  . Smoking status: Former Smoker    Types: Cigarettes    Last attempt to quit: 10/05/2010    Years since quitting: 7.2  . Smokeless tobacco: Never Used  Substance Use Topics  . Alcohol use: No  . Drug use: No     Allergies   Iodine   Review of Systems Review of Systems  Constitutional: Negative.   Genitourinary: Negative.        Normal menstrual period 2 weeks ago  Musculoskeletal: Positive for arthralgias.       Pain of left foot     Physical Exam Updated Vital Signs BP 124/78 (BP Location: Right Arm)   Pulse 67   Temp 98.2 F (36.8 C) (Oral)   Resp 16   Ht 5\' 7"  (1.702 m)   Wt 116.1 kg (256 lb)   LMP 01/11/2018   SpO2 100%   BMI 40.10 kg/m   Physical  Exam  Constitutional: She appears well-developed and well-nourished.  HENT:  Head: Normocephalic and atraumatic.  Eyes: Pupils are equal, round, and reactive to light. Conjunctivae are normal.  Neck: Neck supple. No tracheal deviation present. No thyromegaly present.  Cardiovascular: Normal rate and regular rhythm.  No murmur heard. Pulmonary/Chest: Effort normal and breath sounds normal.  Abdominal: Soft. Bowel sounds are normal. She exhibits no distension. There is no tenderness.  Musculoskeletal: Normal range of motion. She exhibits no edema or tenderness.  Her entire spine is nontender.  Pelvis stable.  Left lower extremity skin is intact.  No deformity.  Tenderness over third and fourth toes, mild.  No subungual hematoma.  DP pulse 2+.  Good capillary refill.  All other extremities without contusion abrasion or tenderness neurovascularly intact  Neurological: She is alert. Coordination normal.  Skin: Skin is warm and dry. No rash noted.  Psychiatric: She has a normal mood and affect.  Nursing note and vitals  reviewed.    ED Treatments / Results  Labs (all labs ordered are listed, but only abnormal results are displayed) Labs Reviewed - No data to display  EKG None  Radiology Dg Foot Complete Left  Result Date: 01/19/2018 CLINICAL DATA:  Fall down stairs at home 2 days prior to imaging. Left foot pain with discoloration and swelling. EXAM: LEFT FOOT - COMPLETE 3+ VIEW COMPARISON:  07/10/2014 ankle radiographs. FINDINGS: There is some subtle cortical irregularity at the bases of the proximal phalanges of the third and fourth toes. There is also some soft tissue swelling in this part of the foot. It is difficult to exclude nondisplaced fractures in this vicinity given the extension of the MTP joints. Correlate with point tenderness in this vicinity. Small accessory navicular. No malalignment at the Lisfranc joint identified. IMPRESSION: 1. Equivocal nondisplaced fractures of the proximal metaphyses of the third and fourth toe proximal phalanges. The findings are admittedly subtle and could be due to bony overlap due to the mild extension of the MTP joints during imaging. Correlate with point tenderness in this vicinity. Electronically Signed   By: Van Clines M.D.   On: 01/19/2018 14:34   X-rays viewed by me Procedures Procedures (including critical care time)  Medications Ordered in ED Medications - No data to display   Initial Impression / Assessment and Plan / ED Course  I have reviewed the triage vital signs and the nursing notes.  Pertinent labs & imaging results that were available during my care of the patient were reviewed by me and considered in my medical decision making (see chart for details).     Plan postop shoe, prescription [place] Controlled Substance reporting System queried.  Referral Dr. Barbaraann Barthel as needed 4 days Clinically exam correlates with closed fractures of third and fourth toes, nondisplaced Final Clinical Impressions(s) / ED Diagnoses    Final diagnoses:  Fall in home, initial encounter  Injury of left foot, initial encounter    ED Discharge Orders        Ordered    HYDROcodone-acetaminophen (NORCO) 5-325 MG tablet  Every 6 hours PRN     01/19/18 1518       Orlie Dakin, MD 01/19/18 1530

## 2018-01-22 ENCOUNTER — Ambulatory Visit (INDEPENDENT_AMBULATORY_CARE_PROVIDER_SITE_OTHER): Payer: Self-pay | Admitting: Family Medicine

## 2018-01-22 ENCOUNTER — Encounter: Payer: Self-pay | Admitting: Family Medicine

## 2018-01-22 DIAGNOSIS — S99922A Unspecified injury of left foot, initial encounter: Secondary | ICD-10-CM

## 2018-01-22 MED ORDER — OXYCODONE-ACETAMINOPHEN 5-325 MG PO TABS: 1.0000 | ORAL_TABLET | Freq: Four times a day (QID) | ORAL | 0 refills | Status: DC | PRN

## 2018-01-22 NOTE — Patient Instructions (Signed)
Your x-rays are reassuring. You have a foot sprain (of the joint between your metatarsals and toes). Wear cam walker when up and walking around. Take this off a couple times a day to do motion exercises we discussed. Icing 15 minutes at a time 3-4 times a day. Follow up with me in 2 weeks for reevaluation.

## 2018-01-25 ENCOUNTER — Encounter: Payer: Self-pay | Admitting: Family Medicine

## 2018-01-25 DIAGNOSIS — S99922A Unspecified injury of left foot, initial encounter: Secondary | ICD-10-CM | POA: Insufficient documentation

## 2018-01-25 NOTE — Progress Notes (Signed)
PCP: [facility], [facility]  Subjective:   HPI: Patient is a 27 y.o. female here for left foot injury.  Patient reports on 4/6 she fell down multiple steps (~14 of these) leading to pain, swelling of her toes. Pain primarily in great toe at 8/10 level, sharp. Worse with ambulation. Tried ibuprofen, hydrocodone with minimal benefit. No skin changes, numbness. Had x-rays showing possible fractures of 3rd, 4th toes. Wearing postop shoe.  Past Medical History:  Diagnosis Date  . Brain tumor (benign) ([place])   . Chlamydia   . Gonorrhea     Current Outpatient Medications on File Prior to Visit  Medication Sig Dispense Refill  . folic acid (FOLVITE) 1 MG tablet Take 1 mg by mouth daily.    [name] w/o A Vit-FeFum-FePo-FA (CONCEPT OB) 130-92.4-1 MG CAPS Take 1 tablet by mouth daily. 30 capsule 12   No current facility-administered medications on file prior to visit.     Past Surgical History:  Procedure Laterality Date  . NO PAST SURGERIES      Allergies  Allergen Reactions  . Iodine Shortness Of Breath and Itching    Contrast in IV    Social History   Socioeconomic History  . Marital status: Single    Spouse name: Not on file  . Number of children: Not on file  . Years of education: Not on file  . Highest education level: Not on file  Occupational History  . Not on file  Social Needs  . Financial resource strain: Not on file  . Food insecurity:    Worry: Not on file    Inability: Not on file  . Transportation needs:    Medical: Not on file    Non-medical: Not on file  Tobacco Use  . Smoking status: Former Smoker    Types: Cigarettes    Last attempt to quit: 10/05/2010    Years since quitting: 7.3  . Smokeless tobacco: Never Used  Substance and Sexual Activity  . Alcohol use: No  . Drug use: No  . Sexual activity: Not Currently    Birth control/protection: None    Comment: not since baby  Lifestyle  . Physical activity:    Days per week: Not on  file    Minutes per session: Not on file  . Stress: Not on file  Relationships  . Social connections:    Talks on phone: Not on file    Gets together: Not on file    Attends religious service: Not on file    Active member of club or organization: Not on file    Attends meetings of clubs or organizations: Not on file    Relationship status: Not on file  . Intimate partner violence:    Fear of current or ex partner: Not on file    Emotionally abused: Not on file    Physically abused: Not on file    Forced sexual activity: Not on file  Other Topics Concern  . Not on file  Social History Narrative  . Not on file    Family History  Problem Relation Age of Onset  . Cancer Mother   . Diabetes Paternal Grandfather     BP 139/88   Pulse 88   Ht 5\' 7"  (1.702 m)   Wt 267 lb (121.1 kg)   LMP 01/11/2018   BMI 41.82 kg/m   Review of Systems: See HPI above.     Objective:  Physical Exam:  Gen: NAD, comfortable in exam  room  Left foot/ankle: Mild swelling 1st-4th digits.  No bruising, other deformity.  No malrotation or angulation. FROM ankle without pain.  Mod limitation motion all digits but with pain on flexion and extension especially of great toe. TTP 1st-4th MTPs primarily.  Mild tenderness proximal and distal to these areas. Negative ant drawer and talar tilt.   Negative syndesmotic compression. Negative metatarsal squeeze. Thompsons test negative. NV intact distally.  Right foot/ankle: No deformity. FROM with 5/5 strength. No tenderness to palpation. NVI distally.   Assessment & Plan:  1. Left foot injury - independently reviewed radiographs and no evidence fracture.  Consistent with foot sprain.  Switch to cam walker to wear when up and walking around.  Elevation, icing.  Motion exercises.  Aleve or ibuprofen.   Oxycodone as needed for severe pain.  F/u in 2 weeks.

## 2018-01-25 NOTE — Assessment & Plan Note (Signed)
independently reviewed radiographs and no evidence fracture.  Consistent with foot sprain.  Switch to cam walker to wear when up and walking around.  Elevation, icing.  Motion exercises.  Aleve or ibuprofen.   Oxycodone as needed for severe pain.  F/u in 2 weeks.

## 2018-02-03 ENCOUNTER — Ambulatory Visit: Payer: Self-pay | Admitting: Family Medicine

## 2018-03-23 ENCOUNTER — Encounter (HOSPITAL_BASED_OUTPATIENT_CLINIC_OR_DEPARTMENT_OTHER): Payer: Self-pay | Admitting: Emergency Medicine

## 2018-03-23 ENCOUNTER — Other Ambulatory Visit: Payer: Self-pay

## 2018-03-23 ENCOUNTER — Emergency Department (HOSPITAL_BASED_OUTPATIENT_CLINIC_OR_DEPARTMENT_OTHER): Payer: Self-pay

## 2018-03-23 ENCOUNTER — Emergency Department (HOSPITAL_BASED_OUTPATIENT_CLINIC_OR_DEPARTMENT_OTHER)
Admission: EM | Admit: 2018-03-23 | Discharge: 2018-03-23 | Disposition: A | Discharge status: Home / Self Care | Payer: Self-pay | Attending: Emergency Medicine | Admitting: Emergency Medicine

## 2018-03-23 DIAGNOSIS — Y999 Unspecified external cause status: Secondary | ICD-10-CM | POA: Insufficient documentation

## 2018-03-23 DIAGNOSIS — W228XXA Striking against or struck by other objects, initial encounter: Secondary | ICD-10-CM | POA: Insufficient documentation

## 2018-03-23 DIAGNOSIS — S62652A Nondisplaced fracture of medial phalanx of right middle finger, initial encounter for closed fracture: Secondary | ICD-10-CM | POA: Insufficient documentation

## 2018-03-23 DIAGNOSIS — Y929 Unspecified place or not applicable: Secondary | ICD-10-CM | POA: Insufficient documentation

## 2018-03-23 DIAGNOSIS — Y9371 Activity, boxing: Secondary | ICD-10-CM | POA: Insufficient documentation

## 2018-03-23 DIAGNOSIS — Z87891 Personal history of nicotine dependence: Secondary | ICD-10-CM | POA: Insufficient documentation

## 2018-03-23 MED ORDER — NAPROXEN 250 MG PO TABS
500.0000 mg | ORAL_TABLET | Freq: Once | ORAL | Status: AC
  Administered 2018-03-23: 500 mg via ORAL
  Filled 2018-03-23: qty 2

## 2018-03-23 MED ORDER — NAPROXEN 500 MG PO TABS: 500.0000 mg | ORAL_TABLET | Freq: Two times a day (BID) | ORAL | 0 refills | Status: DC

## 2018-03-23 NOTE — ED Triage Notes (Signed)
Pt states she injured R middle finger boxing yesterday. Pt wearing homemade splint on finger. Reports pain, swelling and inability to bend finger.

## 2018-03-23 NOTE — ED Notes (Signed)
PMS intact before and after. Pt tolerated well. All questions answered. 

## 2018-03-23 NOTE — Discharge Instructions (Signed)
You were seen today and have a fracture of your right middle digit.  Keep splint in place.  You will likely be able to transition to buddy tape but you need to follow-up with hand surgery for repeat examination.  Take naproxen as needed for pain.

## 2018-03-23 NOTE — ED Provider Notes (Signed)
[facility] Provider Note   CSN: [id number] Arrival date & time: 03/23/18  0044     History   Chief Complaint Chief Complaint  Patient presents with  . Finger Injury    HPI Maria Morgan is a 27 y.o. female.  HPI  This is a 27 year old female who presents with a right finger injury.  Patient reports that she was boxing last night when she missed and hit the television.  She reports pain to her right middle digit.  She reports decreased range of motion.  Pain is 7 out of 10.  She has not taken anything for the pain.  She denies numbness or tingling of the digit.  She is right-handed.  Past Medical History:  Diagnosis Date  . Brain tumor (benign) ([place])   . Chlamydia   . Gonorrhea     Patient Active Problem List   Diagnosis Date Noted  . Foot injury, left, initial encounter 01/25/2018  . Low vitamin D level 10/21/2016  . Pituitary adenoma ([place]) 10/10/2016  . Obese 10/10/2016  . Vitamin D deficiency 01/15/2016  . ADHD (attention deficit hyperactivity disorder) 10/14/2015  . Hyperprolactinemia ([place]) 09/26/2015  . Benign neoplasm of colon 02/20/2012  . 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 NO PAST SURGERIES       OB History    Gravida  2   Para  2   Term  2   Preterm      AB      Living  2     SAB      TAB      Ectopic      Multiple      Live Births  2            Home Medications    Prior to Admission medications   Medication Sig Start Date End Date Taking? Authorizing Provider  folic acid (FOLVITE) 1 MG tablet Take 1 mg by mouth daily.    [provider]  naproxen (NAPROSYN) 500 MG tablet Take 1 tablet (500 mg total) by mouth 2 (two) times daily. 03/23/18   Horton, Barbette Hair, MD  oxyCODONE-acetaminophen (PERCOCET/ROXICET) 5-325 MG tablet Take 1 tablet by mouth every 6 (six) hours as needed for severe pain. 01/22/18   Hudnall, Sharyn Lull, MD    Prenat w/o A Vit-FeFum-FePo-FA (CONCEPT OB) 130-92.4-1 MG CAPS Take 1 tablet by mouth daily. 10/23/16   [name], CNM    Family History Family History  Problem Relation Age of Onset  . Cancer Mother   . Diabetes Paternal Grandfather     Social History Social History   Tobacco Use  . Smoking status: Former Smoker    Types: Cigarettes    Last attempt to quit: 10/05/2010    Years since quitting: 7.4  . Smokeless tobacco: Never Used  Substance Use Topics  . Alcohol use: Yes    Comment: occasional  . Drug use: No     Allergies   Iodine   Review of Systems Review of Systems  Musculoskeletal:       Right middle finger pain  Skin: Negative for wound.  All other systems reviewed and are negative.    Physical Exam Updated Vital Signs BP 107/61 (BP Location: Right Arm)   Pulse 84   Temp 98 F (36.7 C)   Resp 16   Ht 5\' 7"  (1.702 m)   Wt 117 kg (258 lb)  LMP 03/02/2018   SpO2 100%   BMI 40.41 kg/m   Physical Exam  Constitutional: She is oriented to person, place, and time. She appears well-developed and well-nourished.  Obese, no acute distress  HENT:  Head: Normocephalic and atraumatic.  Cardiovascular: Normal rate and regular rhythm.  Pulmonary/Chest: Effort normal. No respiratory distress.  Musculoskeletal:  Focused examination of the right hand reveals decreased range of motion with flexion and extension at the PIP joint, patient is able to flex and extend some but has significant pain, there is tenderness to palpation over the PIP joint as well as swelling noted, no skin tears noted, 2+ radial pulse  Neurological: She is alert and oriented to person, place, and time.  Skin: Skin is warm and dry.  Psychiatric: She has a normal mood and affect.  Nursing note and vitals reviewed.    ED Treatments / Results  Labs (all labs ordered are listed, but only abnormal results are displayed) Labs Reviewed - No data to display  EKG None  Radiology Dg  Finger Middle Right  Result Date: 03/23/2018 CLINICAL DATA:  Right middle finger pain after injury boxing last night. Swelling. EXAM: RIGHT MIDDLE FINGER 2+V COMPARISON:  None. FINDINGS: Minimally displaced volar plate fracture of the middle phalanx extending to the articular surface of the proximal to phalangeal joint. No additional fracture of the digit. Alignment is maintained. There is soft tissue edema. IMPRESSION: Minimally displaced volar plate fracture of the middle phalanx. Electronically Signed   By: Jeb Levering M.D.   On: 03/23/2018 01:42    Procedures Procedures (including critical care time)  Medications Ordered in ED Medications  naproxen (NAPROSYN) tablet 500 mg (500 mg Oral Given 03/23/18 0109)     Initial Impression / Assessment and Plan / ED Course  I have reviewed the triage vital signs and the nursing notes.  Pertinent labs & imaging results that were available during my care of the patient were reviewed by me and considered in my medical decision making (see chart for details).     Patient presents with injury to the right middle digit.  Limited flexion and extension but it does appear intact and limited only secondary to pain.  She is neurovascular intact.  X-rays are concerning for a minimally displaced volar plate fracture.  Patient was placed in a splint.  She was instructed to maintain splint until hand follow-up.  Naproxen as needed for pain.  After history, exam, and medical workup I feel the patient has been appropriately medically screened and is safe for discharge home. Pertinent diagnoses were discussed with the patient. Patient was given return precautions.   Final Clinical Impressions(s) / ED Diagnoses   Final diagnoses:  Closed nondisplaced fracture of middle phalanx of right middle finger, initial encounter    ED Discharge Orders        Ordered    naproxen (NAPROSYN) 500 MG tablet  2 times daily     03/23/18 0156       Horton, Barbette Hair,  MD 03/23/18 [phone]

## 2018-04-17 ENCOUNTER — Emergency Department (HOSPITAL_BASED_OUTPATIENT_CLINIC_OR_DEPARTMENT_OTHER)
Admission: EM | Admit: 2018-04-17 | Discharge: 2018-04-17 | Disposition: A | Discharge status: Home / Self Care | Payer: Self-pay | Attending: Emergency Medicine | Admitting: Emergency Medicine

## 2018-04-17 ENCOUNTER — Other Ambulatory Visit: Payer: Self-pay

## 2018-04-17 ENCOUNTER — Encounter (HOSPITAL_BASED_OUTPATIENT_CLINIC_OR_DEPARTMENT_OTHER): Payer: Self-pay

## 2018-04-17 DIAGNOSIS — K130 Diseases of lips: Secondary | ICD-10-CM

## 2018-04-17 DIAGNOSIS — F909 Attention-deficit hyperactivity disorder, unspecified type: Secondary | ICD-10-CM | POA: Insufficient documentation

## 2018-04-17 DIAGNOSIS — Z79899 Other long term (current) drug therapy: Secondary | ICD-10-CM | POA: Insufficient documentation

## 2018-04-17 DIAGNOSIS — L089 Local infection of the skin and subcutaneous tissue, unspecified: Secondary | ICD-10-CM | POA: Insufficient documentation

## 2018-04-17 MED ORDER — ACETAMINOPHEN 500 MG PO TABS
1000.0000 mg | ORAL_TABLET | Freq: Once | ORAL | Status: AC
  Administered 2018-04-17: 1000 mg via ORAL
  Filled 2018-04-17: qty 2

## 2018-04-17 MED ORDER — CLINDAMYCIN HCL 150 MG PO CAPS: 450.0000 mg | ORAL_CAPSULE | Freq: Three times a day (TID) | ORAL | 0 refills | Status: AC

## 2018-04-17 MED ORDER — LIDOCAINE HCL (PF) 1 % IJ SOLN
5.0000 mL | Freq: Once | INTRAMUSCULAR | Status: AC
  Administered 2018-04-17: 5 mL
  Filled 2018-04-17: qty 5

## 2018-04-17 MED ORDER — IBUPROFEN 600 MG PO TABS: 600.0000 mg | ORAL_TABLET | Freq: Four times a day (QID) | ORAL | 0 refills | Status: DC | PRN

## 2018-04-17 MED ORDER — ACETAMINOPHEN 500 MG PO TABS: 500.0000 mg | ORAL_TABLET | Freq: Four times a day (QID) | ORAL | 0 refills | Status: DC | PRN

## 2018-04-17 MED FILL — CLINDAMYCIN HCL 150 MG CAPS: 150 | 7 days supply | Qty: 63 | Fill #0

## 2018-04-17 MED FILL — ACETAMINOPHEN 500 MG TABS: 500 | 25 days supply | Qty: 100 | Fill #0

## 2018-04-17 NOTE — ED Triage Notes (Signed)
Pt states she woke at 630am with swelling to right side upper lip-states piercing is one month old-pt cleaned area last night and attempted to remove metal stud this am without success-NAD-steady gait

## 2018-04-17 NOTE — Discharge Instructions (Signed)
Take clindamycin as prescribed until completed.  Take with a probiotic which she can find at the pharmacy.  This will help to prevent stomach upset.  Please see your doctor or return to the emergency department in 2 days for wound recheck.  Please return to the emergency department if you develop any new or worsening symptoms including increasing pain, redness, swelling, red streaking from the area, or fevers.

## 2018-04-17 NOTE — ED Provider Notes (Signed)
[facility] Provider Note   CSN: [id number] Arrival date & time: 04/17/18  1204     History   Chief Complaint Chief Complaint  Patient presents with  . Oral Swelling    HPI Maria Morgan is a 27 y.o. female with history of pituitary adenoma who presents with a 1 day history of lip swelling around lip piercing that was done 1 month ago.  Patient reports he was healing up fine and she was able to see the metal backing inside her lip yesterday.  This morning she woke up with significant swelling to her lip and pain.  She has tried hydroperoxide at home and getting the piercing out without relief.  She denies any fevers.  HPI  Past Medical History:  Diagnosis Date  . Brain tumor (benign) ([place])   . Chlamydia   . Gonorrhea     Patient Active Problem List   Diagnosis Date Noted  . Foot injury, left, initial encounter 01/25/2018  . Low vitamin D level 10/21/2016  . Pituitary adenoma ([place]) 10/10/2016  . Obese 10/10/2016  . Vitamin D deficiency 01/15/2016  . ADHD (attention deficit hyperactivity disorder) 10/14/2015  . Hyperprolactinemia ([place]) 09/26/2015  . Benign neoplasm of colon 02/20/2012  . 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 NO PAST SURGERIES       OB History    Gravida  2   Para  2   Term  2   Preterm      AB      Living  2     SAB      TAB      Ectopic      Multiple      Live Births  2            Home Medications    Prior to Admission medications   Medication Sig Start Date End Date Taking? Authorizing Provider  acetaminophen (TYLENOL) 500 MG tablet Take 1 tablet (500 mg total) by mouth every 6 (six) hours as needed. 04/17/18   Lucillia Corson, Bea Graff, PA-C  clindamycin (CLEOCIN) 150 MG capsule Take 3 capsules (450 mg total) by mouth 3 (three) times daily for 7 days. 04/17/18 04/24/18  Frederica Kuster, PA-C  folic acid (FOLVITE) 1 MG tablet Take 1 mg by mouth  daily.    [provider]  ibuprofen (ADVIL,MOTRIN) 600 MG tablet Take 1 tablet (600 mg total) by mouth every 6 (six) hours as needed. 04/17/18   Koran Seabrook, Bea Graff, PA-C  naproxen (NAPROSYN) 500 MG tablet Take 1 tablet (500 mg total) by mouth 2 (two) times daily. 03/23/18   Horton, Barbette Hair, MD  oxyCODONE-acetaminophen (PERCOCET/ROXICET) 5-325 MG tablet Take 1 tablet by mouth every 6 (six) hours as needed for severe pain. 01/22/18   Hudnall, Sharyn Lull, MD  Prenat w/o A Vit-FeFum-FePo-FA (CONCEPT OB) 130-92.4-1 MG CAPS Take 1 tablet by mouth daily. 10/23/16   [name], CNM    Family History Family History  Problem Relation Age of Onset  . Cancer Mother   . Diabetes Paternal Grandfather     Social History Social History   Tobacco Use  . Smoking status: Never Smoker  . Smokeless tobacco: Never Used  Substance Use Topics  . Alcohol use: Yes    Comment: occasional  . Drug use: No     Allergies   Iodine   Review of Systems Review of Systems  Constitutional: Negative for  chills and fever.  HENT: Positive for facial swelling. Negative for sore throat.   Respiratory: Negative for shortness of breath.   Cardiovascular: Negative for chest pain.  Gastrointestinal: Negative for abdominal pain, nausea and vomiting.  Genitourinary: Negative for dysuria.  Musculoskeletal: Negative for back pain.  Skin: Negative for rash and wound.  Neurological: Negative for headaches.  Psychiatric/Behavioral: The patient is not nervous/anxious.      Physical Exam Updated Vital Signs BP (!) 143/78 (BP Location: Left Arm)   Pulse 80   Temp 98.4 F (36.9 C) (Oral)   Resp 18   Ht 5\' 7"  (1.702 m)   Wt 113.9 kg (251 lb)   LMP 03/22/2018   SpO2 100%   BMI 39.31 kg/m   Physical Exam  Constitutional: She appears well-developed and well-nourished. No distress.  HENT:  Head: Normocephalic and atraumatic.  Mouth/Throat: Oropharynx is clear and moist. No oropharyngeal exudate.  Edema  noted to right side upper lip surrounding the piercing, see photo Metal backing not visible in the mucosa  Eyes: Pupils are equal, round, and reactive to light. Conjunctivae are normal. Right eye exhibits no discharge. Left eye exhibits no discharge. No scleral icterus.  Neck: Normal range of motion. Neck supple. No thyromegaly present.  Cardiovascular: Normal rate, regular rhythm, normal heart sounds and intact distal pulses. Exam reveals no gallop and no friction rub.  No murmur heard. Pulmonary/Chest: Effort normal and breath sounds normal. No stridor. No respiratory distress. She has no wheezes. She has no rales.  Abdominal: Soft. Bowel sounds are normal. She exhibits no distension. There is no tenderness. There is no rebound and no guarding.  Musculoskeletal: She exhibits no edema.  Lymphadenopathy:    She has no cervical adenopathy.  Neurological: She is alert. Coordination normal.  Skin: Skin is warm and dry. No rash noted. She is not diaphoretic. No pallor.  Psychiatric: She has a normal mood and affect.  Nursing note and vitals reviewed.      ED Treatments / Results  Labs (all labs ordered are listed, but only abnormal results are displayed) Labs Reviewed - No data to display  EKG None  Radiology No results found.  Procedures .Foreign Body Removal Date/Time: 04/17/2018 4:56 PM Performed by: Frederica Kuster, PA-C Authorized by: Frederica Kuster, PA-C  Consent: Verbal consent obtained. Consent given by: patient Patient understanding: patient states understanding of the procedure being performed Intake: lip. Anesthesia: local infiltration  Anesthesia: Local Anesthetic: lidocaine 1% without epinephrine Anesthetic total: 3 mL  Sedation: Patient sedated: no  Patient restrained: no Patient cooperative: yes Complexity: simple 1 objects recovered. Objects recovered: Lip piercing Post-procedure assessment: foreign body removed Patient tolerance: Patient tolerated  the procedure well with no immediate complications   (including critical care time)  Medications Ordered in ED Medications  acetaminophen (TYLENOL) tablet 1,000 mg (1,000 mg Oral Given 04/17/18 1350)  lidocaine (PF) (XYLOCAINE) 1 % injection 5 mL (5 mLs Infiltration Given by Other 04/17/18 1444)     Initial Impression / Assessment and Plan / ED Course  I have reviewed the triage vital signs and the nursing notes.  Pertinent labs & imaging results that were available during my care of the patient were reviewed by me and considered in my medical decision making (see chart for details).     Patient with infected lip piercing with retained piercing unable to be removed.  There is purulent drainage.  Will treat with clindamycin.  Lip was anesthetized with lidocaine by myself and piercing  was removed.  Wound check advised in 2 days.  Return precautions discussed for reasons to return sooner.  She understands and agrees with plan.  Patient vitals stable throughout ED course and discharged in satisfactory condition.  Patient also evaluated by Dr. Maryan Rued who guided the patient's management and agrees with plan.  Final Clinical Impressions(s) / ED Diagnoses   Final diagnoses:  Pierced lip infection    ED Discharge Orders        Ordered    clindamycin (CLEOCIN) 150 MG capsule  3 times daily     04/17/18 1510    ibuprofen (ADVIL,MOTRIN) 600 MG tablet  Every 6 hours PRN     04/17/18 1510    acetaminophen (TYLENOL) 500 MG tablet  Every 6 hours PRN     04/17/18 1510       Zoe Creasman, [place], PA-C 04/17/18 1657    Blanchie Dessert, MD 04/21/18 1335

## 2018-08-23 IMAGING — US US MFM OB LIMITED
1 series · 15 of 25 positions shown · non-contrast
Comparison: none

[Series 1: us mfm ob limited · 15 of 25 slices shown]
[im 1/25]
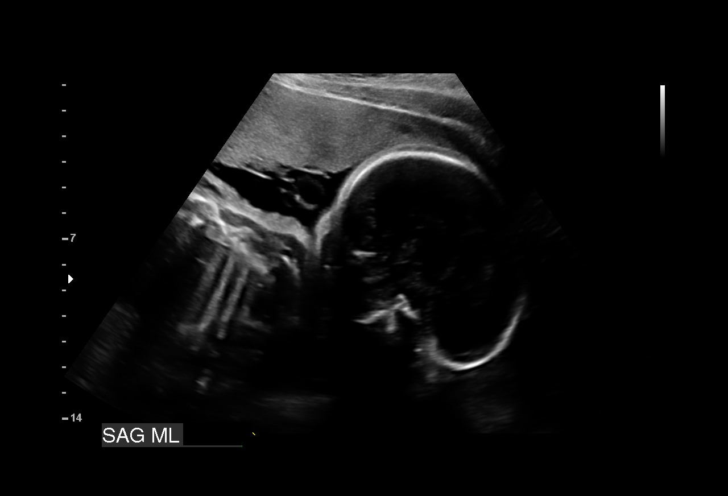
[im 3/25]
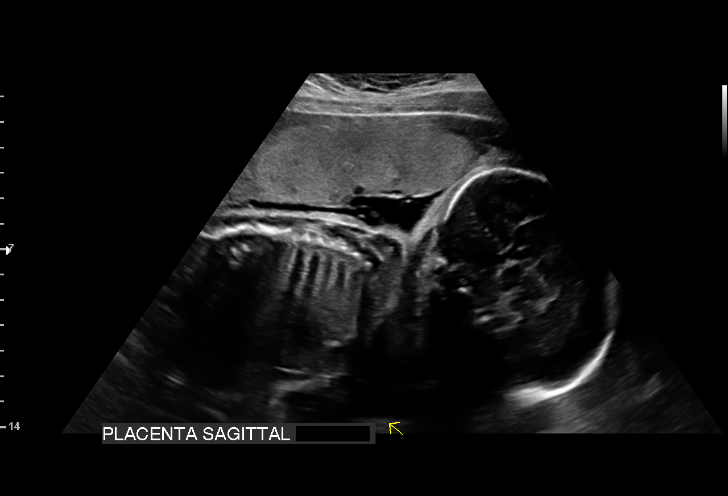
[im 5/25]
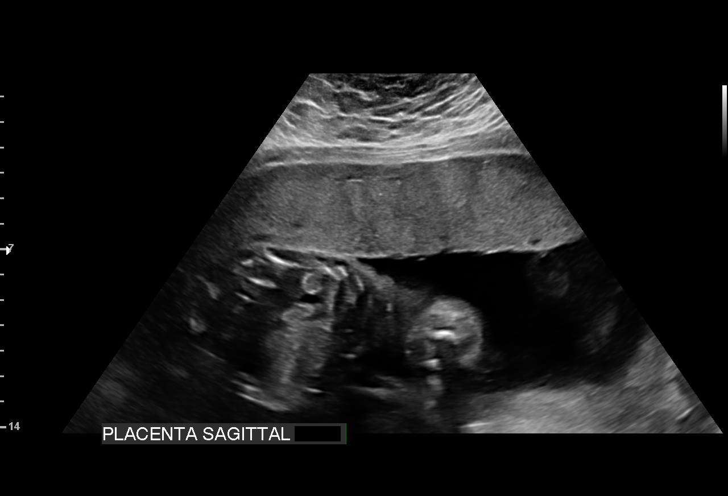
[im 6/25]
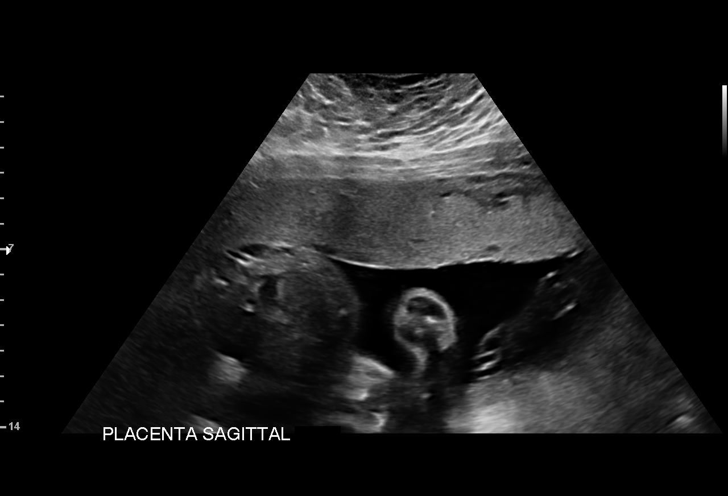
[im 8/25]
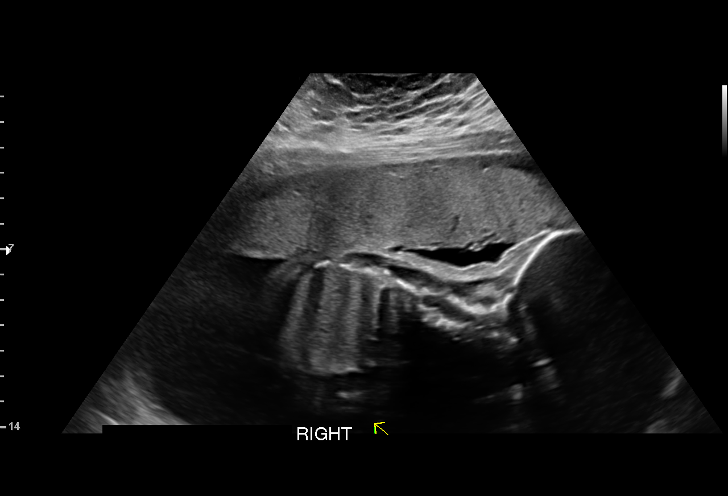
[im 10/25]
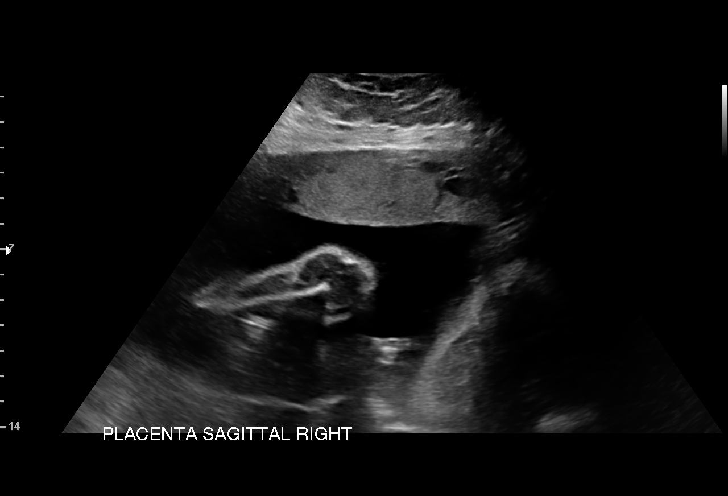
[im 11/25]
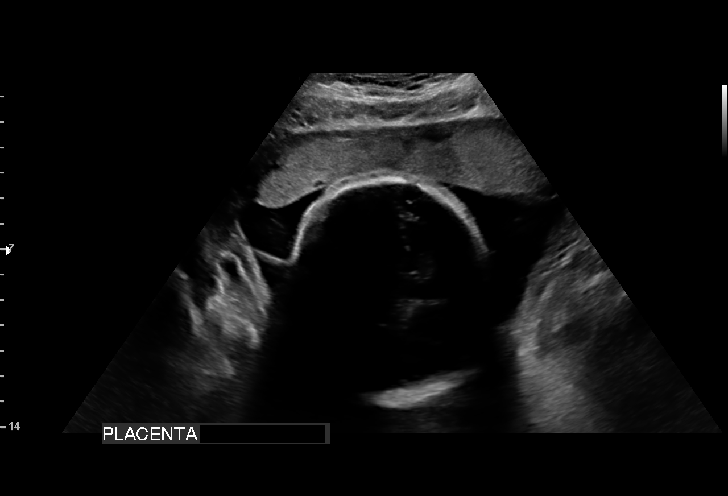
[im 13/25]
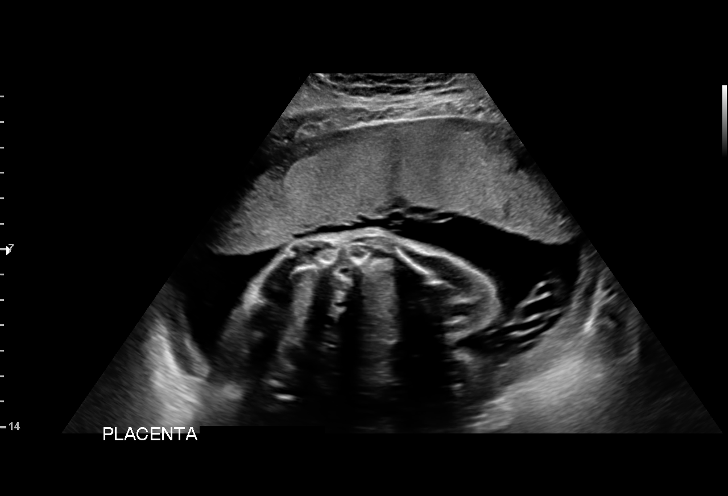
[im 15/25]
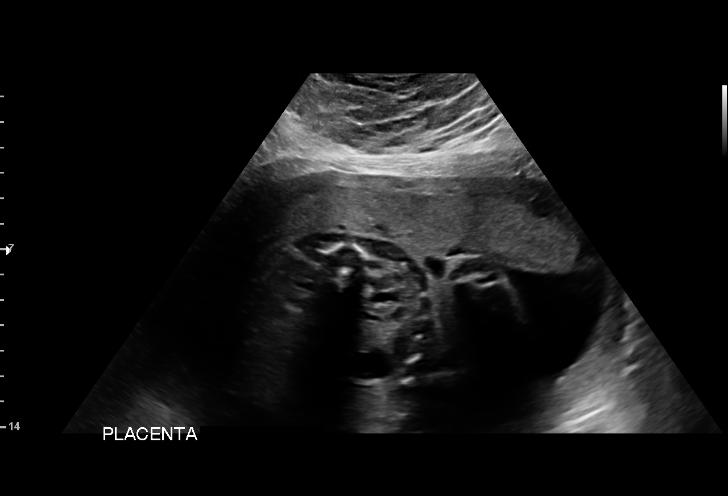
[im 16/25]
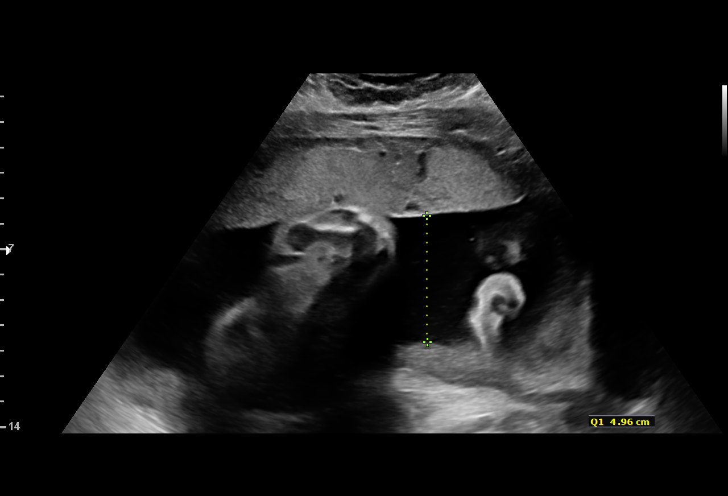
[im 18/25]
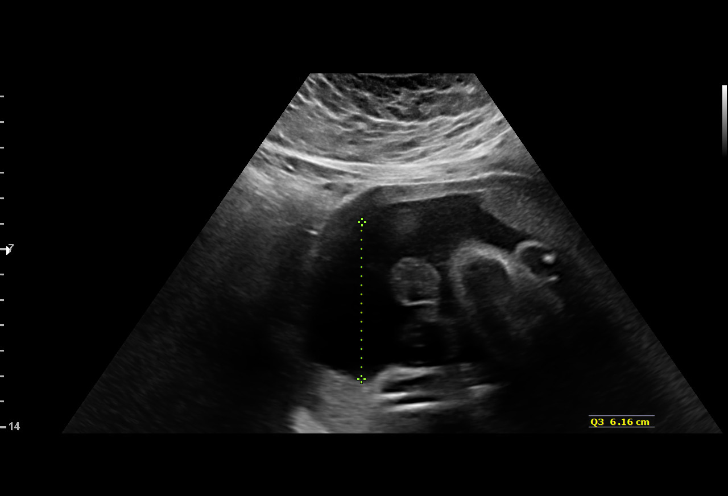
[im 20/25]
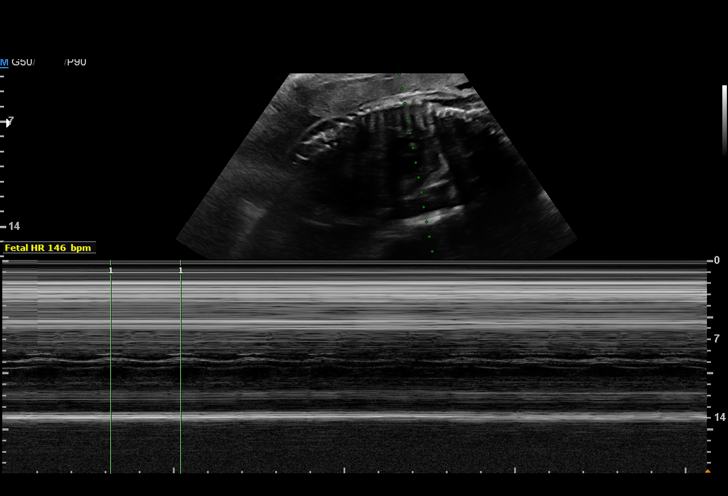
[im 21/25]
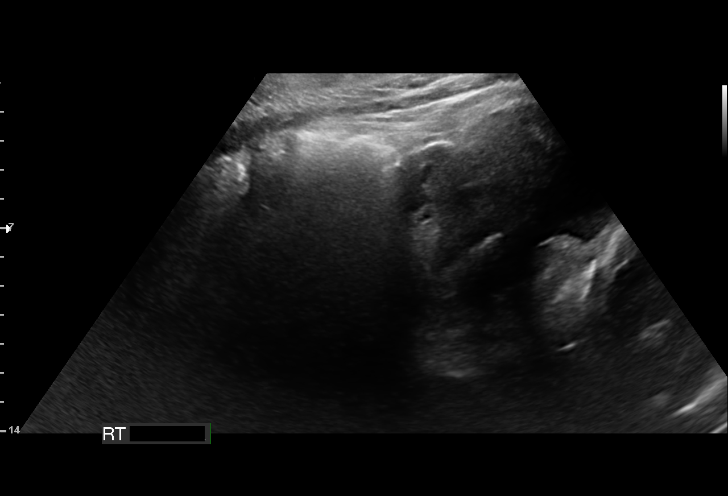
[im 23/25]
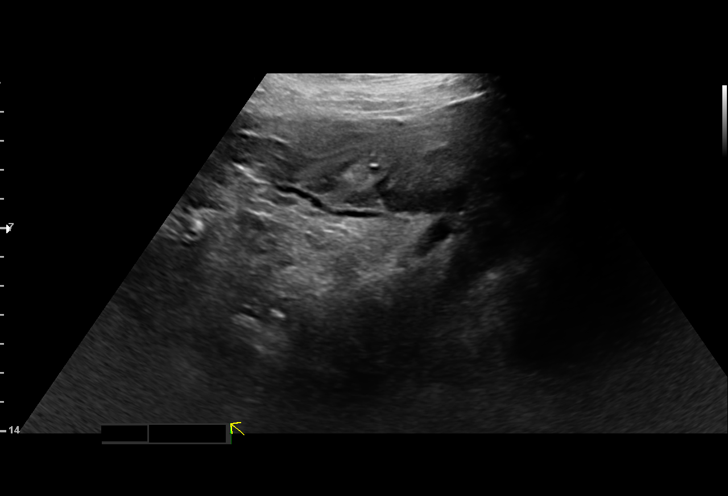
[im 25/25]
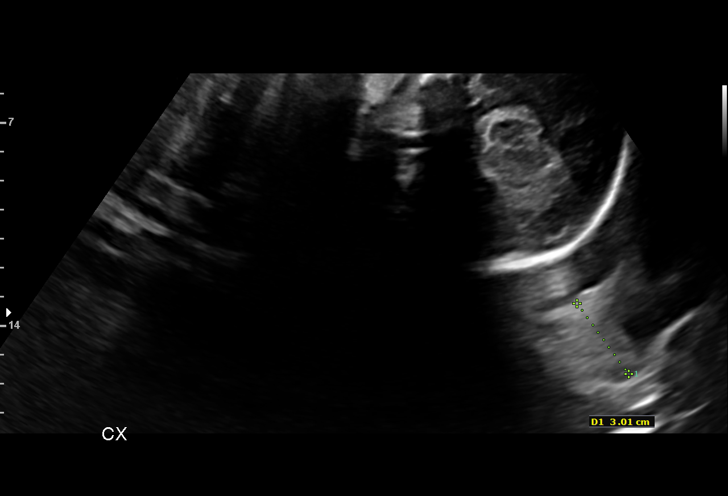

[15 of 25 positions shown; findings below may reference images not displayed]

Road [HOSPITAL]

1  AREEBA STEVENSON             [id number]      [id number]     [id number]
Indications

28 weeks gestation of pregnancy
Traumatic injury during pregnancy - fall - no
abd trauma
Vaginal bleeding in pregnancy, third trimester
Medical complication of pregnancy (pituitary
adenoma)
Obesity complicating pregnancy, third
trimester
OB History

Gravidity:    2         Term:   1
Fetal Evaluation

Num Of Fetuses:     1
Fetal Heart         146
Rate(bpm):
Cardiac Activity:   Observed
Presentation:       Cephalic
Placenta:           Anterior, above cervical os

Amniotic Fluid
AFI FV:      Subjectively upper-normal

AFI Sum(cm)     %Tile       Largest Pocket(cm)
20.25           81

RUQ(cm)       RLQ(cm)       LUQ(cm)        LLQ(cm)
4.96

Comment:    No placental abruption or previa identified.
Gestational Age

Best:          28w 0d    Det. By:   Previous Ultrasound      EDD:   05/03/17
(09/07/16)
Cervix Uterus Adnexa

Cervix
Length:            3.4  cm.
Normal appearance by transabdominal scan.

Left Ovary
Not visualized.

Right Ovary
Not visualized.
Impression

IUP at 28+0 weeks, S/P fall
Normal fetal movement and cardiac activity
Normal amniotic fluid
Normal placentation with no evidence of abruption
Recommendations

Continue with clinical evaluation and management

## 2019-12-10 ENCOUNTER — Emergency Department (HOSPITAL_BASED_OUTPATIENT_CLINIC_OR_DEPARTMENT_OTHER)
Admission: EM | Admit: 2019-12-10 | Discharge: 2019-12-10 | Disposition: A | Discharge status: Home / Self Care | Payer: Managed Care, Other (non HMO) | Attending: Emergency Medicine | Admitting: Emergency Medicine

## 2019-12-10 ENCOUNTER — Other Ambulatory Visit: Payer: Self-pay

## 2019-12-10 DIAGNOSIS — N899 Noninflammatory disorder of vagina, unspecified: Secondary | ICD-10-CM | POA: Diagnosis present

## 2019-12-10 DIAGNOSIS — N760 Acute vaginitis: Secondary | ICD-10-CM | POA: Diagnosis not present

## 2019-12-10 DIAGNOSIS — B9689 Other specified bacterial agents as the cause of diseases classified elsewhere: Secondary | ICD-10-CM

## 2019-12-10 DIAGNOSIS — Z79899 Other long term (current) drug therapy: Secondary | ICD-10-CM | POA: Diagnosis not present

## 2019-12-10 DIAGNOSIS — Z883 Allergy status to other anti-infective agents status: Secondary | ICD-10-CM | POA: Insufficient documentation

## 2019-12-10 LAB — URINALYSIS, ROUTINE W REFLEX MICROSCOPIC
Bilirubin Urine: NEGATIVE
Glucose, UA: NEGATIVE mg/dL
Hgb urine dipstick: NEGATIVE
Ketones, ur: NEGATIVE mg/dL
Nitrite: NEGATIVE
Protein, ur: NEGATIVE mg/dL
Specific Gravity, Urine: 1.025 (ref 1.005–1.030)
pH: 6 (ref 5.0–8.0)

## 2019-12-10 LAB — WET PREP, GENITAL
Sperm: NONE SEEN
Trich, Wet Prep: NONE SEEN
Yeast Wet Prep HPF POC: NONE SEEN

## 2019-12-10 LAB — PREGNANCY, URINE: Preg Test, Ur: NEGATIVE

## 2019-12-10 LAB — URINALYSIS, MICROSCOPIC (REFLEX)

## 2019-12-10 LAB — HIV ANTIBODY (ROUTINE TESTING W REFLEX): HIV Screen 4th Generation wRfx: NONREACTIVE

## 2019-12-10 MED ORDER — METRONIDAZOLE 500 MG PO TABS: 500.0000 mg | ORAL_TABLET | Freq: Two times a day (BID) | ORAL | 0 refills | Status: DC

## 2019-12-10 MED ORDER — FLUCONAZOLE 150 MG PO TABS: 150.0000 mg | ORAL_TABLET | Freq: Every day | ORAL | 0 refills | Status: DC

## 2019-12-10 NOTE — Discharge Instructions (Signed)
You were seen in the ER today for vaginal irritation. Your wet prep showed findings of bacterial vaginosis- please see handout for details.  We were treating this with Flagyl, an antibiotic, please take this as prescribed.  Do not drink alcohol when taking Flagyl as it can be extremely dangerous.  We are also sending you home with a one-time dose of Diflucan to treat for possible yeast given you have been on antibiotics.  We have prescribed you new medication(s) today. Discuss the medications prescribed today with your pharmacist as they can have adverse effects and interactions with your other medicines including over the counter and prescribed medications. Seek medical evaluation if you start to experience new or abnormal symptoms after taking one of these medicines, seek care immediately if you start to experience difficulty breathing, feeling of your throat closing, facial swelling, or rash as these could be indications of a more serious allergic reaction  Please follow-up with your primary care provider and/or OB/GYN within 1 week for reevaluation.  Return to the emergency department for new or worsening symptoms including but not limited to increased irritation, pelvic pain, inability to keep fluids down, fever, or any other concerns.

## 2019-12-10 NOTE — ED Triage Notes (Signed)
Vaginal discharge and itching, pain when wiping and painful intercourse. On antibiotic for dental issues . Denies bleeding nor dysuria.

## 2019-12-10 NOTE — ED Provider Notes (Signed)
[facility] EMERGENCY DEPARTMENT Provider Note   CSN: [id number] Arrival date & time: 12/10/19  [id number]     History Chief Complaint  Patient presents with  . Vaginal Discharge    Maria Morgan is a 29 y.o. female with a history of gonorrhea, chlamydia, ADHD, & pituitary adenoma who presents to the ED with complaints of vaginal irritation x 1 week. Patient states she is having white vaginal discharge, mild pruritus, and vaginal discomfort to the introitus area with wiping. No other alleviating/aggravating factors. Currently on Amoxicillin for a dental issue-seeing a dentist for this. Sexually active w/ 1 female partner without protection, no known exposure to STD. Denies fever, chills, nausea, vomiting, pelvic pain, urgency, frequency, or dysuria.   HPI     Past Medical History:  Diagnosis Date  . Brain tumor (benign) ([place])   . Chlamydia   . Gonorrhea     Patient Active Problem List   Diagnosis Date Noted  . Foot injury, left, initial encounter 01/25/2018  . Low vitamin D level 10/21/2016  . Pituitary adenoma ([place]) 10/10/2016  . Obese 10/10/2016  . Vitamin D deficiency 01/15/2016  . ADHD (attention deficit hyperactivity disorder) 10/14/2015  . Hyperprolactinemia ([place]) 09/26/2015  . Benign neoplasm of colon 02/20/2012  . 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 NO PAST SURGERIES       OB History    Gravida  2   Para  2   Term  2   Preterm      AB      Living  2     SAB      TAB      Ectopic      Multiple      Live Births  2           Family History  Problem Relation Age of Onset  . Cancer Mother   . Diabetes Paternal Grandfather     Social History   Tobacco Use  . Smoking status: Never Smoker  . Smokeless tobacco: Never Used  Substance Use Topics  . Alcohol use: Yes    Comment: occasional  . Drug use: No    Home Medications Prior to Admission medications   Medication  Sig Start Date End Date Taking? Authorizing Provider  acetaminophen (TYLENOL) 500 MG tablet Take 1 tablet (500 mg total) by mouth every 6 (six) hours as needed. 04/17/18   Law, Bea Graff, PA-C  folic acid (FOLVITE) 1 MG tablet Take 1 mg by mouth daily.    [provider]  ibuprofen (ADVIL,MOTRIN) 600 MG tablet Take 1 tablet (600 mg total) by mouth every 6 (six) hours as needed. 04/17/18   Law, Bea Graff, PA-C  naproxen (NAPROSYN) 500 MG tablet Take 1 tablet (500 mg total) by mouth 2 (two) times daily. 03/23/18   Horton, Barbette Hair, MD  oxyCODONE-acetaminophen (PERCOCET/ROXICET) 5-325 MG tablet Take 1 tablet by mouth every 6 (six) hours as needed for severe pain. 01/22/18   Hudnall, Sharyn Lull, MD  Prenat w/o A Vit-FeFum-FePo-FA (CONCEPT OB) 130-92.4-1 MG CAPS Take 1 tablet by mouth daily. 10/23/16   Tamala Julian, [place], CNM    Allergies    Iodine  Review of Systems   Review of Systems  Constitutional: Negative for chills and fever.  Respiratory: Negative for shortness of breath.   Cardiovascular: Negative for chest pain.  Gastrointestinal: Negative for abdominal pain, diarrhea, nausea and vomiting.  Genitourinary: Positive for vaginal discharge  and vaginal pain. Negative for dysuria, frequency, genital sores, pelvic pain, urgency and vaginal bleeding.  All other systems reviewed and are negative.   Physical Exam Updated Vital Signs BP (!) 132/93 (BP Location: Right Arm)   Pulse 80   Temp 98.2 F (36.8 C) (Oral)   Resp 18   Ht 5\' 7"  (1.702 m)   Wt 117.9 kg   SpO2 98%   BMI 40.72 kg/m   Physical Exam Vitals and nursing note reviewed. Exam conducted with a chaperone present.  Constitutional:      General: She is not in acute distress.    Appearance: She is well-developed. She is not toxic-appearing.  HENT:     Head: Normocephalic and atraumatic.  Eyes:     General:        Right eye: No discharge.        Left eye: No discharge.     Conjunctiva/sclera: Conjunctivae normal.   Cardiovascular:     Rate and Rhythm: Normal rate and regular rhythm.  Pulmonary:     Effort: Pulmonary effort is normal. No respiratory distress.     Breath sounds: Normal breath sounds. No wheezing, rhonchi or rales.  Abdominal:     General: There is no distension.     Palpations: Abdomen is soft.     Tenderness: There is no abdominal tenderness. There is no right CVA tenderness, left CVA tenderness, guarding or rebound.  Genitourinary:    Labia:        Right: No lesion.        Left: No lesion.      Vagina: Vaginal discharge (white moderate amount) present.     Cervix: Discharge present. No cervical motion tenderness.     Adnexa:        Right: No tenderness or fullness.         Left: No tenderness or fullness.       Comments: RN present as chaperone.  Musculoskeletal:     Cervical back: Neck supple.  Skin:    General: Skin is warm and dry.     Findings: No rash.  Neurological:     Mental Status: She is alert.     Comments: Clear speech.   Psychiatric:        Behavior: Behavior normal.     ED Results / Procedures / Treatments   Labs (all labs ordered are listed, but only abnormal results are displayed) Labs Reviewed  WET PREP, GENITAL - Abnormal; Notable for the following components:      Result Value   Clue Cells Wet Prep HPF POC PRESENT (*)    WBC, Wet Prep HPF POC MANY (*)    All other components within normal limits  URINALYSIS, ROUTINE W REFLEX MICROSCOPIC - Abnormal; Notable for the following components:   Leukocytes,Ua SMALL (*)    All other components within normal limits  URINALYSIS, MICROSCOPIC (REFLEX) - Abnormal; Notable for the following components:   Bacteria, UA RARE (*)    All other components within normal limits  PREGNANCY, URINE  RPR  HIV ANTIBODY (ROUTINE TESTING W REFLEX)  GC/CHLAMYDIA PROBE AMP ([place]) NOT AT [facility]    EKG None  Radiology No results found.  Procedures Procedures (including critical care time)  Medications  Ordered in ED Medications - No data to display  ED Course  I have reviewed the triage vital signs and the nursing notes.  Pertinent labs & imaging results that were available during my care of the patient were  reviewed by me and considered in my medical decision making (see chart for details).    MDM Rules/Calculators/A&P                      Patient presents to the ED with complaints of vaginal irritation. Nontoxic, no apparent distress, vitals WNL with the exception of mildly elevated diastolic BP- doubt HTN emergency. Additional history obtained from nursing notes and chart review. No abdominal pain, nontender abdomen, no adnexal or cervical motion tenderness- not consistent w/ STI. UA without overt infection without urinary sxs. Preg test negative. Wet prep w/ BV- will treat with flagyl. Given she is on abx and clinically more suspicious for yeast will give 1 x dose diflucan as well, patient in agreement. No lesions to raise concern for HSV. STI tests pending, patient is aware of pending tests & need to seek treatment and inform sexual partners if positive. I discussed results, treatment plan, need for follow-up, and return precautions with the patient. Provided opportunity for questions, patient confirmed understanding and is in agreement with plan.   Final Clinical Impression(s) / ED Diagnoses Final diagnoses:  BV (bacterial vaginosis)    Rx / DC Orders ED Discharge Orders         Ordered    metroNIDAZOLE (FLAGYL) 500 MG tablet  2 times daily     12/10/19 1035    fluconazole (DIFLUCAN) 150 MG tablet  Daily     12/10/19 [address], [place] R, PA-C 12/10/19 [place], Adam, DO 12/10/19 1215

## 2019-12-11 LAB — GC/CHLAMYDIA PROBE AMP (~~LOC~~) NOT AT ARMC
Chlamydia: NEGATIVE
Neisseria Gonorrhea: NEGATIVE

## 2019-12-11 LAB — RPR: RPR Ser Ql: NONREACTIVE

## 2020-01-11 ENCOUNTER — Emergency Department (HOSPITAL_BASED_OUTPATIENT_CLINIC_OR_DEPARTMENT_OTHER)
Admission: EM | Admit: 2020-01-11 | Discharge: 2020-01-11 | Disposition: A | Discharge status: Home / Self Care | Payer: Managed Care, Other (non HMO) | Attending: Emergency Medicine | Admitting: Emergency Medicine

## 2020-01-11 ENCOUNTER — Encounter (HOSPITAL_BASED_OUTPATIENT_CLINIC_OR_DEPARTMENT_OTHER): Payer: Self-pay | Admitting: Emergency Medicine

## 2020-01-11 ENCOUNTER — Other Ambulatory Visit: Payer: Self-pay

## 2020-01-11 DIAGNOSIS — R1084 Generalized abdominal pain: Secondary | ICD-10-CM | POA: Diagnosis present

## 2020-01-11 DIAGNOSIS — Z79899 Other long term (current) drug therapy: Secondary | ICD-10-CM | POA: Insufficient documentation

## 2020-01-11 DIAGNOSIS — R110 Nausea: Secondary | ICD-10-CM | POA: Insufficient documentation

## 2020-01-11 LAB — CBC WITH DIFFERENTIAL/PLATELET
Abs Immature Granulocytes: 0.01 10*3/uL (ref 0.00–0.07)
Basophils Absolute: 0 10*3/uL (ref 0.0–0.1)
Basophils Relative: 0 %
Eosinophils Absolute: 0.1 10*3/uL (ref 0.0–0.5)
Eosinophils Relative: 1 %
HCT: 35.3 % — ABNORMAL LOW (ref 36.0–46.0)
Hemoglobin: 11.7 g/dL — ABNORMAL LOW (ref 12.0–15.0)
Immature Granulocytes: 0 %
Lymphocytes Relative: 31 %
Lymphs Abs: 1.3 10*3/uL (ref 0.7–4.0)
MCH: 29.9 pg (ref 26.0–34.0)
MCHC: 33.1 g/dL (ref 30.0–36.0)
MCV: 90.3 fL (ref 80.0–100.0)
Monocytes Absolute: 0.4 10*3/uL (ref 0.1–1.0)
Monocytes Relative: 9 %
Neutro Abs: 2.4 10*3/uL (ref 1.7–7.7)
Neutrophils Relative %: 59 %
Platelets: 207 10*3/uL (ref 150–400)
RBC: 3.91 MIL/uL (ref 3.87–5.11)
RDW: 12.8 % (ref 11.5–15.5)
WBC: 4.1 10*3/uL (ref 4.0–10.5)
nRBC: 0 % (ref 0.0–0.2)

## 2020-01-11 LAB — COMPREHENSIVE METABOLIC PANEL
ALT: 17 U/L (ref 0–44)
AST: 19 U/L (ref 15–41)
Albumin: 3.8 g/dL (ref 3.5–5.0)
Alkaline Phosphatase: 38 U/L (ref 38–126)
Anion gap: 7 (ref 5–15)
BUN: 11 mg/dL (ref 6–20)
CO2: 25 mmol/L (ref 22–32)
Calcium: 8.7 mg/dL — ABNORMAL LOW (ref 8.9–10.3)
Chloride: 105 mmol/L (ref 98–111)
Creatinine, Ser: 0.78 mg/dL (ref 0.44–1.00)
GFR calc Af Amer: >60 mL/min (ref >60)
GFR calc non Af Amer: >60 mL/min (ref >60)
Glucose, Bld: 92 mg/dL (ref 70–99)
Potassium: 3.6 mmol/L (ref 3.5–5.1)
Sodium: 137 mmol/L (ref 135–145)
Total Bilirubin: 0.8 mg/dL (ref 0.3–1.2)
Total Protein: 6.9 g/dL (ref 6.5–8.1)

## 2020-01-11 LAB — URINALYSIS, ROUTINE W REFLEX MICROSCOPIC
Bilirubin Urine: NEGATIVE
Glucose, UA: NEGATIVE mg/dL
Hgb urine dipstick: NEGATIVE
Ketones, ur: NEGATIVE mg/dL
Leukocytes,Ua: NEGATIVE
Nitrite: NEGATIVE
Protein, ur: NEGATIVE mg/dL
Specific Gravity, Urine: 1.015 (ref 1.005–1.030)
pH: 6.5 (ref 5.0–8.0)

## 2020-01-11 LAB — PREGNANCY, URINE: Preg Test, Ur: NEGATIVE

## 2020-01-11 MED ORDER — ACETAMINOPHEN 325 MG PO TABS
650.0000 mg | ORAL_TABLET | Freq: Once | ORAL | Status: AC
  Administered 2020-01-11: 650 mg via ORAL
  Filled 2020-01-11: qty 2

## 2020-01-11 MED ORDER — SODIUM CHLORIDE 0.9 % IV BOLUS
1000.0000 mL | Freq: Once | INTRAVENOUS | Status: AC
  Administered 2020-01-11: 1000 mL via INTRAVENOUS

## 2020-01-11 NOTE — Discharge Instructions (Addendum)
Return for recheck if symptoms persist past 24 hours

## 2020-01-11 NOTE — ED Provider Notes (Signed)
[facility] EMERGENCY DEPARTMENT Provider Note   CSN: [id number] Arrival date & time: 01/11/20  1246     History Chief Complaint  Patient presents with  . Abdominal Pain    Maria Morgan is a 29 y.o. female.  Pt complains of abdominal pain.  Pt reports she feels nauseated and has abdominal cramping.  Pt took a cleanser yesterday.  Pt reports she has had increaed bowel movements  The history is provided by the patient. No language interpreter was used.  Abdominal Pain Pain location:  Generalized Pain quality: aching   Pain radiates to:  Does not radiate Pain severity:  No pain Timing:  Constant Progression:  Worsening Chronicity:  New Relieved by:  Nothing Worsened by:  Nothing Ineffective treatments:  None tried Associated symptoms: nausea   Risk factors: not pregnant        Past Medical History:  Diagnosis Date  . Brain tumor (benign) ([place])   . Chlamydia   . Gonorrhea     Patient Active Problem List   Diagnosis Date Noted  . Foot injury, left, initial encounter 01/25/2018  . Low vitamin D level 10/21/2016  . Pituitary adenoma ([place]) 10/10/2016  . Obese 10/10/2016  . Vitamin D deficiency 01/15/2016  . ADHD (attention deficit hyperactivity disorder) 10/14/2015  . Hyperprolactinemia ([place]) 09/26/2015  . Benign neoplasm of colon 02/20/2012  . 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 NO PAST SURGERIES       OB History    Gravida  2   Para  2   Term  2   Preterm      AB      Living  2     SAB      TAB      Ectopic      Multiple      Live Births  2           Family History  Problem Relation Age of Onset  . Cancer Mother   . Diabetes Paternal Grandfather     Social History   Tobacco Use  . Smoking status: Never Smoker  . Smokeless tobacco: Never Used  Substance Use Topics  . Alcohol use: Yes    Comment: occasional  . Drug use: No    Home Medications Prior  to Admission medications   Medication Sig Start Date End Date Taking? Authorizing Provider  acetaminophen (TYLENOL) 500 MG tablet Take 1 tablet (500 mg total) by mouth every 6 (six) hours as needed. 04/17/18   Law, Bea Graff, PA-C  fluconazole (DIFLUCAN) 150 MG tablet Take 1 tablet (150 mg total) by mouth daily. 12/10/19   Petrucelli, Aldona Bar R, PA-C  folic acid (FOLVITE) 1 MG tablet Take 1 mg by mouth daily.    [provider]  ibuprofen (ADVIL,MOTRIN) 600 MG tablet Take 1 tablet (600 mg total) by mouth every 6 (six) hours as needed. 04/17/18   Law, Bea Graff, PA-C  metroNIDAZOLE (FLAGYL) 500 MG tablet Take 1 tablet (500 mg total) by mouth 2 (two) times daily. 12/10/19   Petrucelli, Samantha R, PA-C  naproxen (NAPROSYN) 500 MG tablet Take 1 tablet (500 mg total) by mouth 2 (two) times daily. 03/23/18   Horton, Barbette Hair, MD  oxyCODONE-acetaminophen (PERCOCET/ROXICET) 5-325 MG tablet Take 1 tablet by mouth every 6 (six) hours as needed for severe pain. 01/22/18   Hudnall, Sharyn Lull, MD  Prenat w/o A Vit-FeFum-FePo-FA (CONCEPT OB) 130-92.4-1 MG CAPS Take  1 tablet by mouth daily. 10/23/16   Tamala Julian, [place], CNM    Allergies    Iodine  Review of Systems   Review of Systems  Gastrointestinal: Positive for abdominal pain and nausea.  All other systems reviewed and are negative.   Physical Exam Updated Vital Signs BP 126/72 (BP Location: Right Arm)   Pulse 74   Temp 98.2 F (36.8 C) (Oral)   Resp 18   Ht 5\' 7"  (1.702 m)   Wt 124.7 kg   LMP 11/28/2019 (Approximate)   SpO2 98%   BMI 43.07 kg/m   Physical Exam Vitals and nursing note reviewed.  Constitutional:      Appearance: She is well-developed.  HENT:     Head: Normocephalic.  Cardiovascular:     Rate and Rhythm: Normal rate and regular rhythm.  Pulmonary:     Effort: Pulmonary effort is normal.  Abdominal:     General: Abdomen is flat. Bowel sounds are normal. There is no distension.     Palpations: Abdomen is soft.      Tenderness: There is generalized abdominal tenderness.     Hernia: No hernia is present.  Musculoskeletal:        General: Normal range of motion.     Cervical back: Normal range of motion.  Skin:    General: Skin is warm.  Neurological:     Mental Status: She is alert and oriented to person, place, and time.     ED Results / Procedures / Treatments   Labs (all labs ordered are listed, but only abnormal results are displayed) Labs Reviewed  CBC WITH DIFFERENTIAL/PLATELET - Abnormal; Notable for the following components:      Result Value   Hemoglobin 11.7 (*)    HCT 35.3 (*)    All other components within normal limits  COMPREHENSIVE METABOLIC PANEL - Abnormal; Notable for the following components:   Calcium 8.7 (*)    All other components within normal limits  PREGNANCY, URINE  URINALYSIS, ROUTINE W REFLEX MICROSCOPIC    EKG None  Radiology No results found.  Procedures Procedures (including critical care time)  Medications Ordered in ED Medications  sodium chloride 0.9 % bolus 1,000 mL (0 mLs Intravenous Stopped 01/11/20 1559)  acetaminophen (TYLENOL) tablet 650 mg (650 mg Oral Given 01/11/20 1559)    ED Course  I have reviewed the triage vital signs and the nursing notes.  Pertinent labs & imaging results that were available during my care of the patient were reviewed by me and considered in my medical decision making (see chart for details).    MDM Rules/Calculators/A&P                      MDM  Pt feels better after iv fluids,  Labs are normal.  Upreg is negative, ua is negative  Final Clinical Impression(s) / ED Diagnoses Final diagnoses:  Nausea    Rx / DC Orders ED Discharge Orders    None    An After Visit Summary was printed and given to the patient.   Fransico Meadow, PA-C 01/11/20 [place], MD 01/13/20 [phone]

## 2020-01-11 NOTE — ED Triage Notes (Signed)
Pt reports being sent home by work r/t dizziness, sob, abdominal pain and back pain. Pt denies fever, cough, denies loc. Pt reports doing "detox" today for breakfast. Pt endorses possible pregnancy

## 2020-09-01 ENCOUNTER — Emergency Department (HOSPITAL_BASED_OUTPATIENT_CLINIC_OR_DEPARTMENT_OTHER)
Admission: EM | Admit: 2020-09-01 | Discharge: 2020-09-01 | Disposition: A | Discharge status: Home / Self Care | Payer: Managed Care, Other (non HMO) | Attending: Emergency Medicine | Admitting: Emergency Medicine

## 2020-09-01 ENCOUNTER — Other Ambulatory Visit: Payer: Self-pay

## 2020-09-01 ENCOUNTER — Encounter (HOSPITAL_BASED_OUTPATIENT_CLINIC_OR_DEPARTMENT_OTHER): Payer: Self-pay

## 2020-09-01 DIAGNOSIS — Z86011 Personal history of benign neoplasm of the brain: Secondary | ICD-10-CM | POA: Insufficient documentation

## 2020-09-01 DIAGNOSIS — W290XXA Contact with powered kitchen appliance, initial encounter: Secondary | ICD-10-CM | POA: Insufficient documentation

## 2020-09-01 DIAGNOSIS — S61011A Laceration without foreign body of right thumb without damage to nail, initial encounter: Secondary | ICD-10-CM | POA: Insufficient documentation

## 2020-09-01 DIAGNOSIS — Z86010 Personal history of colonic polyps: Secondary | ICD-10-CM | POA: Insufficient documentation

## 2020-09-01 MED ORDER — BACITRACIN ZINC 500 UNIT/GM EX OINT
TOPICAL_OINTMENT | Freq: Two times a day (BID) | CUTANEOUS | Status: DC
  Administered 2020-09-01: 1 via TOPICAL

## 2020-09-01 MED ORDER — CEPHALEXIN 500 MG PO CAPS: 500.0000 mg | ORAL_CAPSULE | Freq: Three times a day (TID) | ORAL | 0 refills | Status: AC

## 2020-09-01 NOTE — ED Provider Notes (Signed)
[facility] EMERGENCY DEPARTMENT Provider Note   CSN: [id number] Arrival date & time: 09/01/20  1117     History Chief Complaint  Patient presents with  . Laceration    Maria Morgan is a 29 y.o. female presents today for concern of laceration of her right thumb.  She was cleaning her blender yesterday when her thumb rubbed up against one of the blades.  She noticed a cut to the pad of her right thumb she immediately applied pressure and placed a Band-Aid.  She reports that every once while she has removed the Band-Aid and notices it still oozing.  She is concerned as it continues to ooze which is why she checked into the ER today.  She reports a minimal sharp pain to the area worse with palpation improved with rest and not touching the area, pain does not radiate.  She reports her Tdap was updated earlier this year.  Denies any other injuries today numbness weakness tingling fever chills or any additional concerns.  HPI     Past Medical History:  Diagnosis Date  . Brain tumor (benign) ([place])   . Chlamydia   . Gonorrhea     Patient Active Problem List   Diagnosis Date Noted  . Foot injury, left, initial encounter 01/25/2018  . Low vitamin D level 10/21/2016  . Pituitary adenoma ([place]) 10/10/2016  . Obese 10/10/2016  . Vitamin D deficiency 01/15/2016  . ADHD (attention deficit hyperactivity disorder) 10/14/2015  . Hyperprolactinemia ([place]) 09/26/2015  . Benign neoplasm of colon 02/20/2012  . 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 NO PAST SURGERIES       OB History    Gravida  2   Para  2   Term  2   Preterm      AB      Living  2     SAB      TAB      Ectopic      Multiple      Live Births  2           Family History  Problem Relation Age of Onset  . Cancer Mother   . Diabetes Paternal Grandfather     Social History   Tobacco Use  . Smoking status: Never Smoker  .  Smokeless tobacco: Never Used  Vaping Use  . Vaping Use: Never used  Substance Use Topics  . Alcohol use: Yes    Comment: occasional  . Drug use: No    Home Medications Prior to Admission medications   Medication Sig Start Date End Date Taking? Authorizing Provider  acetaminophen (TYLENOL) 500 MG tablet Take 1 tablet (500 mg total) by mouth every 6 (six) hours as needed. 04/17/18   Law, Bea Graff, PA-C  cephALEXin (KEFLEX) 500 MG capsule Take 1 capsule (500 mg total) by mouth 3 (three) times daily for 5 days. 09/01/20 09/06/20  Nuala Alpha A, PA-C  fluconazole (DIFLUCAN) 150 MG tablet Take 1 tablet (150 mg total) by mouth daily. 12/10/19   Petrucelli, Aldona Bar R, PA-C  folic acid (FOLVITE) 1 MG tablet Take 1 mg by mouth daily.    [provider]  ibuprofen (ADVIL,MOTRIN) 600 MG tablet Take 1 tablet (600 mg total) by mouth every 6 (six) hours as needed. 04/17/18   Law, Bea Graff, PA-C  metroNIDAZOLE (FLAGYL) 500 MG tablet Take 1 tablet (500 mg total) by mouth 2 (two) times daily. 12/10/19  Petrucelli, Samantha R, PA-C  naproxen (NAPROSYN) 500 MG tablet Take 1 tablet (500 mg total) by mouth 2 (two) times daily. 03/23/18   Horton, Barbette Hair, MD  oxyCODONE-acetaminophen (PERCOCET/ROXICET) 5-325 MG tablet Take 1 tablet by mouth every 6 (six) hours as needed for severe pain. 01/22/18   Hudnall, Sharyn Lull, MD  Prenat w/o A Vit-FeFum-FePo-FA (CONCEPT OB) 130-92.4-1 MG CAPS Take 1 tablet by mouth daily. 10/23/16   Tamala Julian, [place], CNM    Allergies    Iodine  Review of Systems   Review of Systems  Constitutional: Negative for chills and fever.  Musculoskeletal: Negative for arthralgias and myalgias.  Skin: Positive for wound. Negative for color change.  Neurological: Negative.  Negative for weakness and numbness.    Physical Exam Updated Vital Signs BP (!) 141/96 (BP Location: Left Arm)   Pulse 83   Temp 98.1 F (36.7 C) (Oral)   Resp 18   Ht 5\' 7"  (1.702 m)   Wt 119.7 kg    LMP 08/31/2020   SpO2 100%   BMI 41.35 kg/m   Physical Exam Constitutional:      General: She is not in acute distress.    Appearance: Normal appearance. She is well-developed. She is not ill-appearing or diaphoretic.  HENT:     Head: Normocephalic and atraumatic.  Eyes:     General: Vision grossly intact. Gaze aligned appropriately.     Pupils: Pupils are equal, round, and reactive to light.  Neck:     Trachea: Trachea and phonation normal.  Pulmonary:     Effort: Pulmonary effort is normal. No respiratory distress.  Abdominal:     General: There is no distension.     Palpations: Abdomen is soft.     Tenderness: There is no abdominal tenderness. There is no guarding or rebound.  Musculoskeletal:        General: Normal range of motion.     Cervical back: Normal range of motion.     Comments: Right hand: Approximately 1 cm superficial curved laceration to the pad of the right thumb which extends around 1-2 millimeter into the thumb.  It is not gaping and not bleeding.  Small amount of serosanguineous drainage around wound.  No erythema fluctuance purulent drainage or induration.  Otherwise hand and fingers appear normal. No gross deformities, skin intact. Fingers appear normal. No snuffbox tenderness to palpation. No tenderness to palpation over flexor sheath.  Finger adduction/abduction intact with 5/5 strength.  Thumb opposition intact. Full active and resisted ROM to flexion/extension at wrist, MCP, PIP and DIP of all fingers.  FDS/FDP intact. Grip 5/5 strength.  Radial artery 2+ with <2sec cap refill in all fingers.  Sensation intact to light-tough in median/ulnar/radial distributions.  Skin:    General: Skin is warm and dry.  Neurological:     Mental Status: She is alert.     GCS: GCS eye subscore is 4. GCS verbal subscore is 5. GCS motor subscore is 6.     Comments: Speech is clear and goal oriented, follows commands Major Cranial nerves without deficit, no facial droop Moves  extremities without ataxia, coordination intact  Psychiatric:        Behavior: Behavior normal.     ED Results / Procedures / Treatments   Labs (all labs ordered are listed, but only abnormal results are displayed) Labs Reviewed - No data to display  EKG None  Radiology No results found.  Procedures Procedures (including critical care time)  Medications Ordered in ED  Medications  bacitracin ointment (has no administration in time range)    ED Course  I have reviewed the triage vital signs and the nursing notes.  Pertinent labs & imaging results that were available during my care of the patient were reviewed by me and considered in my medical decision making (see chart for details).    MDM Rules/Calculators/A&P                         Additional history obtained from: 1. Nursing notes from this visit. ----------------- 29 year old female presents with a small superficial laceration of the pad of the right thumb that occurred yesterday with cleaning her blender.  She is concerned because she sees some serosanguineous drainage there does not actually bleeding and not gaping.  There does appear to be some secondary healing already in process.  There is no indication for repair today.  There is no evidence of cellulitis septic arthritis or other emergent pathologies.  Additionally I can see the base of the wound well and history as well as physical examination is not suggestive of possible foreign body.  No indication for imaging or further work-up at this time.  I did discuss potential of x-ray to assess for foreign body with the patient however she did not want one, shared decision making made and I agree with patient's decision as there is low suspicion for foreign body at this time.  Additionally doubt fracture/dislocation or other osseous abnormalities.  She is neurovascularly intact.  Area was cleaned and dressed by nursing staff.  However given it has been open for close to 18  hours will start patient on prophylactic course of antibiotics.  Patient reports her Tdap is up-to-date earlier this year.  Patient urged to follow-up with her PCP for recheck next week.  Of note patient denies chance of pregnancy today.  Patient states understanding that medications given or prescribed today may result in harm to of a pregnancy and she accepts these risks and still chooses not to be pregnancy tested and proceed with medications.  At this time there does not appear to be any evidence of an acute emergency medical condition and the patient appears stable for discharge with appropriate outpatient follow up. Diagnosis was discussed with patient who verbalizes understanding of care plan and is agreeable to discharge. I have discussed return precautions with patient who verbalizes understanding. Patient encouraged to follow-up with their PCP. All questions answered.   Note: Portions of this report may have been transcribed using voice recognition software. Every effort was made to ensure accuracy; however, inadvertent computerized transcription errors may still be present. Final Clinical Impression(s) / ED Diagnoses Final diagnoses:  Laceration of right thumb without damage to nail, foreign body presence unspecified, initial encounter    Rx / DC Orders ED Discharge Orders         Ordered    cephALEXin (KEFLEX) 500 MG capsule  3 times daily        09/01/20 1225           Gari Crown 09/01/20 1225    Malvin Johns, MD 09/01/20 1454

## 2020-09-01 NOTE — ED Triage Notes (Signed)
Pt states she cut her R thumb last night with the blender blade while washing dishes. Pt states she has not been able to get the bleeding to stop. Pt has been keeping a band-aid on it but when she takes it off it bleeds again.

## 2020-09-01 NOTE — Discharge Instructions (Addendum)
At this time there does not appear to be the presence of an emergent medical condition, however there is always the potential for conditions to change. Please read and follow the below instructions.  Please return to the Emergency Department immediately for any new or worsening symptoms. Please be sure to follow up with your Primary Care Provider within one week regarding your visit today; please call their office to schedule an appointment even if you are feeling better for a follow-up visit. Please take your antibiotic Keflex as prescribed until complete to help with your symptoms.  Please drink enough water to avoid dehydration and get plenty of rest. You may rinse the area gently with clean soapy water twice a day.  Please keep covered with clean/sterile dressing and you may apply small mount of antibiotic ointment to it for the next 2-3 days.  Go to the nearest Emergency Department immediately if: You have fever or chills Your pain suddenly increases and is severe. You develop severe swelling around the wound. The wound is on your hand or foot and you cannot properly move a finger or toe. The wound is on your hand or foot, and you notice that your fingers or toes look pale or bluish. You have a red streak going away from your wound. You have any new/concerning or worsening of symptoms  Please read the additional information packets attached to your discharge summary.  Do not take your medicine if  develop an itchy rash, swelling in your mouth or lips, or difficulty breathing; call 911 and seek immediate emergency medical attention if this occurs.  You may review your lab tests and imaging results in their entirety on your MyChart account.  Please discuss all results of fully with your primary care provider and other specialist at your follow-up visit.  Note: Portions of this text may have been transcribed using voice recognition software. Every effort was made to ensure accuracy; however,  inadvertent computerized transcription errors may still be present.

## 2021-08-13 ENCOUNTER — Other Ambulatory Visit: Payer: Self-pay

## 2021-08-13 ENCOUNTER — Emergency Department (HOSPITAL_BASED_OUTPATIENT_CLINIC_OR_DEPARTMENT_OTHER): Payer: 59

## 2021-08-13 ENCOUNTER — Emergency Department (HOSPITAL_COMMUNITY): Admission: EM | Admit: 2021-08-13 | Discharge: 2021-08-13 | Discharge status: Against Medical Advice | Payer: Self-pay

## 2021-08-13 ENCOUNTER — Encounter (HOSPITAL_BASED_OUTPATIENT_CLINIC_OR_DEPARTMENT_OTHER): Payer: Self-pay | Admitting: Emergency Medicine

## 2021-08-13 ENCOUNTER — Emergency Department (HOSPITAL_BASED_OUTPATIENT_CLINIC_OR_DEPARTMENT_OTHER)
Admission: EM | Admit: 2021-08-13 | Discharge: 2021-08-14 | Disposition: A | Discharge status: Home / Self Care | Payer: 59 | Attending: Student | Admitting: Student

## 2021-08-13 DIAGNOSIS — R7989 Other specified abnormal findings of blood chemistry: Secondary | ICD-10-CM | POA: Insufficient documentation

## 2021-08-13 DIAGNOSIS — R112 Nausea with vomiting, unspecified: Secondary | ICD-10-CM | POA: Insufficient documentation

## 2021-08-13 DIAGNOSIS — R1084 Generalized abdominal pain: Secondary | ICD-10-CM | POA: Insufficient documentation

## 2021-08-13 DIAGNOSIS — R1013 Epigastric pain: Secondary | ICD-10-CM | POA: Insufficient documentation

## 2021-08-13 LAB — URINALYSIS, ROUTINE W REFLEX MICROSCOPIC
Bilirubin Urine: NEGATIVE
Glucose, UA: NEGATIVE mg/dL
Ketones, ur: NEGATIVE mg/dL
Leukocytes,Ua: NEGATIVE
Nitrite: NEGATIVE
Protein, ur: NEGATIVE mg/dL
Specific Gravity, Urine: 1.015 (ref 1.005–1.030)
pH: 7 (ref 5.0–8.0)

## 2021-08-13 LAB — URINALYSIS, MICROSCOPIC (REFLEX)

## 2021-08-13 LAB — COMPREHENSIVE METABOLIC PANEL
ALT: 189 U/L — ABNORMAL HIGH (ref 0–44)
AST: 379 U/L — ABNORMAL HIGH (ref 15–41)
Albumin: 3.9 g/dL (ref 3.5–5.0)
Alkaline Phosphatase: 79 U/L (ref 38–126)
Anion gap: 5 (ref 5–15)
BUN: 12 mg/dL (ref 6–20)
CO2: 28 mmol/L (ref 22–32)
Calcium: 8.8 mg/dL — ABNORMAL LOW (ref 8.9–10.3)
Chloride: 102 mmol/L (ref 98–111)
Creatinine, Ser: 0.84 mg/dL (ref 0.44–1.00)
GFR, Estimated: >60 mL/min (ref >60)
Glucose, Bld: 88 mg/dL (ref 70–99)
Potassium: 3.5 mmol/L (ref 3.5–5.1)
Sodium: 135 mmol/L (ref 135–145)
Total Bilirubin: 0.7 mg/dL (ref 0.3–1.2)
Total Protein: 7.3 g/dL (ref 6.5–8.1)

## 2021-08-13 LAB — CBC
HCT: 36.5 % (ref 36.0–46.0)
Hemoglobin: 12.3 g/dL (ref 12.0–15.0)
MCH: 30.2 pg (ref 26.0–34.0)
MCHC: 33.7 g/dL (ref 30.0–36.0)
MCV: 89.7 fL (ref 80.0–100.0)
Platelets: 228 10*3/uL (ref 150–400)
RBC: 4.07 MIL/uL (ref 3.87–5.11)
RDW: 12.2 % (ref 11.5–15.5)
WBC: 4.9 10*3/uL (ref 4.0–10.5)
nRBC: 0 % (ref 0.0–0.2)

## 2021-08-13 LAB — PREGNANCY, URINE: Preg Test, Ur: NEGATIVE

## 2021-08-13 LAB — LIPASE, BLOOD: Lipase: 42 U/L (ref 11–51)

## 2021-08-13 MED ORDER — SODIUM CHLORIDE 0.9 % IV BOLUS
1000.0000 mL | Freq: Once | INTRAVENOUS | Status: AC
  Administered 2021-08-13: 1000 mL via INTRAVENOUS

## 2021-08-13 MED ORDER — ONDANSETRON HCL 4 MG/2ML IJ SOLN
4.0000 mg | Freq: Once | INTRAMUSCULAR | Status: AC
  Administered 2021-08-13: 4 mg via INTRAVENOUS
  Filled 2021-08-13: qty 2

## 2021-08-13 MED ORDER — MORPHINE SULFATE (PF) 4 MG/ML IV SOLN
4.0000 mg | Freq: Once | INTRAVENOUS | Status: AC
  Administered 2021-08-13: 4 mg via INTRAVENOUS
  Filled 2021-08-13: qty 1

## 2021-08-13 MED ORDER — IOHEXOL 300 MG/ML  SOLN
100.0000 mL | Freq: Once | INTRAMUSCULAR | Status: AC | PRN
  Administered 2021-08-13: 100 mL via INTRAVENOUS

## 2021-08-13 NOTE — ED Triage Notes (Signed)
Abd  pain and back pain states started at 430 and then felt like she bloated up top of stomach hurts she states  has been nauseated no vomiting denies dysuria

## 2021-08-13 NOTE — ED Notes (Signed)
Called and unable to locate pt in lobby for vitals and triage

## 2021-08-13 NOTE — ED Notes (Signed)
Blue, Dark Green, Red save tubes drawn.

## 2021-08-13 NOTE — ED Provider Notes (Signed)
[facility] EMERGENCY DEPARTMENT Provider Note   CSN: [id number] Arrival date & time: 08/13/21  1804     History Chief Complaint  Patient presents with   Abdominal Pain    Maria Morgan is a 30 y.o. female who presents to the ED today with complaint of gradual onset, constant, sharp, upper abdominal pain that began around 4:30 PM today. Pt also complains of nausea and NBNB emesis. She reports her symptoms started after eating a [employer] chicken alfredo meal and a salad. She denies any fevers, chills, diarrhea. No previous abdominal surgeries. Denies heavy NSAID or EtOH use. No recent sick contacts.   The history is provided by the patient and medical records.      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    Past Surgical History:  Procedure Laterality Date   NO PAST SURGERIES       OB History     Gravida  2   Para  2   Term  2   Preterm      AB      Living  2      SAB      IAB      Ectopic      Multiple      Live Births  2           Family History  Problem Relation Age of Onset   Cancer Mother    Diabetes Paternal Grandfather     Social History   Tobacco Use   Smoking status: Never   Smokeless tobacco: Never  Vaping Use   Vaping Use: Never used  Substance Use Topics   Alcohol use: Yes    Comment: occasional   Drug use: No    Home Medications Prior to Admission medications   Medication Sig Start Date End Date Taking? Authorizing Provider  acetaminophen  (TYLENOL) 500 MG tablet Take 1 tablet (500 mg total) by mouth every 6 (six) hours as needed. 04/17/18   Law, Bea Graff, PA-C  fluconazole (DIFLUCAN) 150 MG tablet Take 1 tablet (150 mg total) by mouth daily. 12/10/19   Petrucelli, Aldona Bar R, PA-C  folic acid (FOLVITE) 1 MG tablet Take 1 mg by mouth daily.    [provider]  ibuprofen (ADVIL,MOTRIN) 600 MG tablet Take 1 tablet (600 mg total) by mouth every 6 (six) hours as needed. 04/17/18   Law, Bea Graff, PA-C  metroNIDAZOLE (FLAGYL) 500 MG tablet Take 1 tablet (500 mg total) by mouth 2 (two) times daily. 12/10/19   Petrucelli, Samantha R, PA-C  naproxen (NAPROSYN) 500 MG tablet Take 1 tablet (500 mg total) by mouth 2 (two) times daily. 03/23/18   Horton, Barbette Hair, MD  oxyCODONE-acetaminophen (PERCOCET/ROXICET) 5-325 MG tablet Take 1 tablet by mouth every 6 (six) hours as needed for severe pain. 01/22/18   Hudnall, Sharyn Lull, MD  Prenat w/o A Vit-FeFum-FePo-FA (CONCEPT OB) 130-92.4-1 MG CAPS Take 1 tablet by mouth daily. 10/23/16   [name], CNM    Allergies  Iodine  Review of Systems   Review of Systems  Constitutional:  Negative for chills and fever.  Cardiovascular:  Negative for chest pain.  Gastrointestinal:  Positive for abdominal pain, nausea and vomiting. Negative for constipation and diarrhea.  All other systems reviewed and are negative.  Physical Exam Updated Vital Signs BP (!) 130/91 (BP Location: Left Arm)   Pulse 72   Temp 98.3 F (36.8 C) (Oral)   Resp 16   Ht 5' 7"  (1.702 m)   Wt 127.5 kg   SpO2 100%   BMI 44.01 kg/m   Physical Exam Vitals and nursing note reviewed.  Constitutional:      Appearance: She is well-developed. She is obese. She is not ill-appearing or diaphoretic.  HENT:     Head: Normocephalic and atraumatic.  Eyes:     Conjunctiva/sclera: Conjunctivae normal.  Cardiovascular:     Rate and Rhythm: Normal rate and regular rhythm.     Heart sounds: Normal heart sounds.  Pulmonary:      Effort: Pulmonary effort is normal.     Breath sounds: Normal breath sounds. No wheezing, rhonchi or rales.  Abdominal:     Palpations: Abdomen is soft.     Tenderness: There is abdominal tenderness in the epigastric area and left upper quadrant. There is no right CVA tenderness, left CVA tenderness, guarding or rebound.  Musculoskeletal:     Cervical back: Neck supple.  Skin:    General: Skin is warm and dry.  Neurological:     Mental Status: She is alert.    ED Results / Procedures / Treatments   Labs (all labs ordered are listed, but only abnormal results are displayed) Labs Reviewed  COMPREHENSIVE METABOLIC PANEL - Abnormal; Notable for the following components:      Result Value   Calcium 8.8 (*)    AST 379 (*)    ALT 189 (*)    All other components within normal limits  URINALYSIS, ROUTINE W REFLEX MICROSCOPIC - Abnormal; Notable for the following components:   Hgb urine dipstick SMALL (*)    All other components within normal limits  URINALYSIS, MICROSCOPIC (REFLEX) - Abnormal; Notable for the following components:   Bacteria, UA RARE (*)    All other components within normal limits  LIPASE, BLOOD  CBC  PREGNANCY, URINE    EKG None  Radiology US Abdomen Limited RUQ (LIVER/GB)  Result Date: 08/13/2021 CLINICAL DATA:  Left elevated liver function test. EXAM: ULTRASOUND ABDOMEN LIMITED RIGHT UPPER QUADRANT COMPARISON:  None. FINDINGS: Gallbladder: No gallstones or wall thickening visualized. No sonographic Murphy sign noted by sonographer. Common bile duct: Diameter: 5 mm Liver: No focal lesion identified. Within normal limits in parenchymal echogenicity. Portal vein is patent on color Doppler imaging with normal direction of blood flow towards the liver. Other: None. IMPRESSION: Unremarkable right upper quadrant ultrasound. Electronically Signed   By: Iven Finn M.D.   On: 08/13/2021 23:25    Procedures Procedures   Medications Ordered in ED Medications   sodium chloride 0.9 % bolus 1,000 mL ( Intravenous Stopped 08/13/21 2304)  ondansetron (ZOFRAN) injection 4 mg (4 mg Intravenous Given 08/13/21 2157)  morphine 4 MG/ML injection 4 mg (4 mg Intravenous Given 08/13/21 2210)  iohexol (OMNIPAQUE) 300 MG/ML solution 100 mL (100 mLs Intravenous Contrast Given 08/13/21 2346)    ED Course  I have reviewed the triage vital signs and the nursing notes.  Pertinent labs & imaging results that were available during my care of the  patient were reviewed by me and considered in my medical decision making (see chart for details).    MDM Rules/Calculators/A&P                           30 year old female presents to the ED today with complaint of diffuse upper abdominal pain, nausea, vomiting that began around 4:30 PM today.  Symptoms started after eating a chicken Alfredo microwavable meal.  On arrival to the ED today vitals are stable.  Patient's work-up started in the waiting room including a CBC, CMP, lipase.  CMP has returned with elevated LFTs.  AST 379 and ALT 189.  T bili and alk phos within normal limits.  Do not have previous to compare to.  Patient denies any heavy NSAID or EtOH use.  CBC and lipase within normal limits.  UPT negative.  Urinalysis without signs of infection.  On my exam she has epigastric and left upper quadrant tenderness palpation.  No obvious right upper quadrant tenderness palpation.  There is concern for likely gallbladder etiology at this time however.  We will plan for right upper quadrant ultrasound for further evaluation.  Will provide pain medication, antiemetics, fluids.   Ultrasound: IMPRESSION:  Unremarkable right upper quadrant ultrasound.   Will plan for CT scan at this time  At shift change case signed out to Dr. Florina Ou who will follow up on CT scan and dispo accordingly.   This note was prepared using Dragon voice recognition software and may include unintentional dictation errors due to the inherent limitations  of voice recognition software.   Final Clinical Impression(s) / ED Diagnoses Final diagnoses:  Elevated LFTs  Generalized abdominal pain    Rx / DC Orders ED Discharge Orders     None        Eustaquio Maize, PA-C 08/14/21 0010    Teressa Lower, MD 08/15/21 2231

## 2021-08-14 LAB — HEPATITIS PANEL, ACUTE
HCV Ab: NONREACTIVE
Hep A IgM: NONREACTIVE
Hep B C IgM: NONREACTIVE
Hepatitis B Surface Ag: NONREACTIVE

## 2021-08-14 MED ORDER — ONDANSETRON 8 MG PO TBDP: 8.0000 mg | ORAL_TABLET | Freq: Three times a day (TID) | ORAL | 1 refills | Status: DC | PRN

## 2021-08-14 MED ORDER — PANTOPRAZOLE SODIUM 40 MG PO TBEC: 40.0000 mg | DELAYED_RELEASE_TABLET | Freq: Every day | ORAL | 0 refills | Status: DC

## 2021-08-14 MED ORDER — PANTOPRAZOLE SODIUM 40 MG IV SOLR
40.0000 mg | Freq: Once | INTRAVENOUS | Status: AC
  Administered 2021-08-14: 40 mg via INTRAVENOUS
  Filled 2021-08-14: qty 40

## 2021-08-14 NOTE — ED Provider Notes (Signed)
Nursing notes and vitals signs, including pulse oximetry, reviewed.  Summary of this visit's results, reviewed by myself:  EKG:  EKG Interpretation  Date/Time:    Ventricular Rate:    PR Interval:    QRS Duration:   QT Interval:    QTC Calculation:   R Axis:     Text Interpretation:          Labs:  Results for orders placed or performed during the hospital encounter of 08/13/21 (from the past 24 hour(s))  Lipase, blood     Status: None   Collection Time: 08/13/21  6:49 PM  Result Value Ref Range   Lipase 42 11 - 51 U/L  Comprehensive metabolic panel     Status: Abnormal   Collection Time: 08/13/21  6:49 PM  Result Value Ref Range   Sodium 135 135 - 145 mmol/L   Potassium 3.5 3.5 - 5.1 mmol/L   Chloride 102 98 - 111 mmol/L   CO2 28 22 - 32 mmol/L   Glucose, Bld 88 70 - 99 mg/dL   BUN 12 6 - 20 mg/dL   Creatinine, Ser 0.84 0.44 - 1.00 mg/dL   Calcium 8.8 (L) 8.9 - 10.3 mg/dL   Total Protein 7.3 6.5 - 8.1 g/dL   Albumin 3.9 3.5 - 5.0 g/dL   AST 379 (H) 15 - 41 U/L   ALT 189 (H) 0 - 44 U/L   Alkaline Phosphatase 79 38 - 126 U/L   Total Bilirubin 0.7 0.3 - 1.2 mg/dL   GFR, Estimated >60 >60 mL/min   Anion gap 5 5 - 15  CBC     Status: None   Collection Time: 08/13/21  6:49 PM  Result Value Ref Range   WBC 4.9 4.0 - 10.5 K/uL   RBC 4.07 3.87 - 5.11 MIL/uL   Hemoglobin 12.3 12.0 - 15.0 g/dL   HCT 36.5 36.0 - 46.0 %   MCV 89.7 80.0 - 100.0 fL   MCH 30.2 26.0 - 34.0 pg   MCHC 33.7 30.0 - 36.0 g/dL   RDW 12.2 11.5 - 15.5 %   Platelets 228 150 - 400 K/uL   nRBC 0.0 0.0 - 0.2 %  Urinalysis, Routine w reflex microscopic Urine, Clean Catch     Status: Abnormal   Collection Time: 08/13/21  6:49 PM  Result Value Ref Range   Color, Urine YELLOW YELLOW   APPearance CLEAR CLEAR   Specific Gravity, Urine 1.015 1.005 - 1.030   pH 7.0 5.0 - 8.0   Glucose, UA NEGATIVE NEGATIVE mg/dL   Hgb urine dipstick SMALL (A) NEGATIVE   Bilirubin Urine NEGATIVE NEGATIVE   Ketones, ur  NEGATIVE NEGATIVE mg/dL   Protein, ur NEGATIVE NEGATIVE mg/dL   Nitrite NEGATIVE NEGATIVE   Leukocytes,Ua NEGATIVE NEGATIVE  Pregnancy, urine     Status: None   Collection Time: 08/13/21  6:49 PM  Result Value Ref Range   Preg Test, Ur NEGATIVE NEGATIVE  Urinalysis, Microscopic (reflex)     Status: Abnormal   Collection Time: 08/13/21  6:49 PM  Result Value Ref Range   RBC / HPF 0-5 0 - 5 RBC/hpf   WBC, UA 0-5 0 - 5 WBC/hpf   Bacteria, UA RARE (A) NONE SEEN   Squamous Epithelial / LPF 0-5 0 - 5    Imaging Studies: CT Abdomen Pelvis W Contrast  Result Date: 08/14/2021 CLINICAL DATA:  Abdominal pain, acute, nonlocalized EXAM: CT ABDOMEN AND PELVIS WITH CONTRAST TECHNIQUE: Multidetector CT imaging of the  abdomen and pelvis was performed using the standard protocol following bolus administration of intravenous contrast. CONTRAST:  159mL OMNIPAQUE IOHEXOL 300 MG/ML  SOLN COMPARISON:  None. FINDINGS: Lower chest: No acute abnormality. Hepatobiliary: No focal liver abnormality is seen. No gallstones, gallbladder wall thickening, or biliary dilatation. Pancreas: Unremarkable Spleen: Unremarkable Adrenals/Urinary Tract: Adrenal glands are unremarkable. Kidneys are normal, without renal calculi, focal lesion, or hydronephrosis. Bladder is unremarkable. Stomach/Bowel: Stomach is within normal limits. Appendix appears normal. No evidence of bowel wall thickening, distention, or inflammatory changes. Vascular/Lymphatic: No significant vascular findings are present. No enlarged abdominal or pelvic lymph nodes. Reproductive: Uterus and bilateral adnexa are unremarkable. Other: No abdominal wall hernia or abnormality. No abdominopelvic ascites. Musculoskeletal: No acute or significant osseous findings. IMPRESSION: Unremarkable examination. No radiographic explanation for the patient's reported symptoms. Electronically Signed   By: Fidela Salisbury M.D.   On: 08/14/2021 00:12   US Abdomen Limited RUQ  (LIVER/GB)  Result Date: 08/13/2021 CLINICAL DATA:  Left elevated liver function test. EXAM: ULTRASOUND ABDOMEN LIMITED RIGHT UPPER QUADRANT COMPARISON:  None. FINDINGS: Gallbladder: No gallstones or wall thickening visualized. No sonographic Murphy sign noted by sonographer. Common bile duct: Diameter: 5 mm Liver: No focal lesion identified. Within normal limits in parenchymal echogenicity. Portal vein is patent on color Doppler imaging with normal direction of blood flow towards the liver. Other: None. IMPRESSION: Unremarkable right upper quadrant ultrasound. Electronically Signed   By: Iven Finn M.D.   On: 08/13/2021 23:25    The location of the patient's pain (epigastric and left upper quadrant) is suggestive of gastritis.  We will treat with a PPI and antinauseant.  An acute hepatitis panel has been sent given her elevated LFTs.     Shanon Rosser, MD 08/14/21 [phone]

## 2022-02-11 ENCOUNTER — Inpatient Hospital Stay (HOSPITAL_BASED_OUTPATIENT_CLINIC_OR_DEPARTMENT_OTHER)
Admission: EM | Admit: 2022-02-11 | Discharge: 2022-02-17 | DRG: 867 | Disposition: A | Discharge status: Home / Self Care | Payer: 59 | Attending: Internal Medicine | Admitting: Internal Medicine

## 2022-02-11 ENCOUNTER — Emergency Department (HOSPITAL_BASED_OUTPATIENT_CLINIC_OR_DEPARTMENT_OTHER): Payer: 59

## 2022-02-11 ENCOUNTER — Other Ambulatory Visit: Payer: Self-pay

## 2022-02-11 ENCOUNTER — Encounter (HOSPITAL_BASED_OUTPATIENT_CLINIC_OR_DEPARTMENT_OTHER): Payer: Self-pay

## 2022-02-11 DIAGNOSIS — A419 Sepsis, unspecified organism: Secondary | ICD-10-CM

## 2022-02-11 DIAGNOSIS — E876 Hypokalemia: Secondary | ICD-10-CM | POA: Diagnosis present

## 2022-02-11 DIAGNOSIS — D6959 Other secondary thrombocytopenia: Secondary | ICD-10-CM | POA: Diagnosis present

## 2022-02-11 DIAGNOSIS — Z833 Family history of diabetes mellitus: Secondary | ICD-10-CM

## 2022-02-11 DIAGNOSIS — Z91041 Radiographic dye allergy status: Secondary | ICD-10-CM

## 2022-02-11 DIAGNOSIS — B54 Unspecified malaria: Secondary | ICD-10-CM | POA: Diagnosis not present

## 2022-02-11 DIAGNOSIS — B179 Acute viral hepatitis, unspecified: Secondary | ICD-10-CM | POA: Diagnosis not present

## 2022-02-11 DIAGNOSIS — Z20822 Contact with and (suspected) exposure to covid-19: Secondary | ICD-10-CM | POA: Diagnosis not present

## 2022-02-11 DIAGNOSIS — Z6841 Body Mass Index (BMI) 40.0 and over, adult: Secondary | ICD-10-CM

## 2022-02-11 DIAGNOSIS — R7401 Elevation of levels of liver transaminase levels: Secondary | ICD-10-CM

## 2022-02-11 DIAGNOSIS — Z809 Family history of malignant neoplasm, unspecified: Secondary | ICD-10-CM | POA: Diagnosis not present

## 2022-02-11 DIAGNOSIS — R52 Pain, unspecified: Secondary | ICD-10-CM | POA: Diagnosis not present

## 2022-02-11 DIAGNOSIS — F909 Attention-deficit hyperactivity disorder, unspecified type: Secondary | ICD-10-CM | POA: Diagnosis present

## 2022-02-11 DIAGNOSIS — R6520 Severe sepsis without septic shock: Secondary | ICD-10-CM | POA: Diagnosis not present

## 2022-02-11 DIAGNOSIS — D352 Benign neoplasm of pituitary gland: Secondary | ICD-10-CM | POA: Diagnosis present

## 2022-02-11 DIAGNOSIS — G929 Unspecified toxic encephalopathy: Secondary | ICD-10-CM | POA: Diagnosis not present

## 2022-02-11 DIAGNOSIS — A4189 Other specified sepsis: Secondary | ICD-10-CM | POA: Diagnosis not present

## 2022-02-11 DIAGNOSIS — D709 Neutropenia, unspecified: Secondary | ICD-10-CM | POA: Diagnosis not present

## 2022-02-11 DIAGNOSIS — B509 Plasmodium falciparum malaria, unspecified: Secondary | ICD-10-CM | POA: Diagnosis not present

## 2022-02-11 DIAGNOSIS — E221 Hyperprolactinemia: Secondary | ICD-10-CM | POA: Diagnosis not present

## 2022-02-11 DIAGNOSIS — G9340 Encephalopathy, unspecified: Secondary | ICD-10-CM

## 2022-02-11 DIAGNOSIS — E66813 Obesity, class 3: Secondary | ICD-10-CM

## 2022-02-11 DIAGNOSIS — O99210 Obesity complicating pregnancy, unspecified trimester: Secondary | ICD-10-CM

## 2022-02-11 HISTORY — DX: Morbid (severe) obesity due to excess calories: E66.01

## 2022-02-11 HISTORY — DX: Obesity, class 3: E66.813

## 2022-02-11 LAB — COMPREHENSIVE METABOLIC PANEL
ALT: 38 U/L (ref 0–44)
AST: 37 U/L (ref 15–41)
Albumin: 3.3 g/dL — ABNORMAL LOW (ref 3.5–5.0)
Alkaline Phosphatase: 53 U/L (ref 38–126)
Anion gap: 7 (ref 5–15)
BUN: 11 mg/dL (ref 6–20)
CO2: 25 mmol/L (ref 22–32)
Calcium: 8.7 mg/dL — ABNORMAL LOW (ref 8.9–10.3)
Chloride: 104 mmol/L (ref 98–111)
Creatinine, Ser: 0.93 mg/dL (ref 0.44–1.00)
GFR, Estimated: >60 mL/min (ref >60)
Glucose, Bld: 98 mg/dL (ref 70–99)
Potassium: 3.4 mmol/L — ABNORMAL LOW (ref 3.5–5.1)
Sodium: 136 mmol/L (ref 135–145)
Total Bilirubin: 1.4 mg/dL — ABNORMAL HIGH (ref 0.3–1.2)
Total Protein: 7.3 g/dL (ref 6.5–8.1)

## 2022-02-11 LAB — URINALYSIS, MICROSCOPIC (REFLEX)

## 2022-02-11 LAB — RESP PANEL BY RT-PCR (FLU A&B, COVID) ARPGX2
Influenza A by PCR: NEGATIVE
Influenza B by PCR: NEGATIVE
SARS Coronavirus 2 by RT PCR: NEGATIVE

## 2022-02-11 LAB — CBC WITH DIFFERENTIAL/PLATELET
Basophils Absolute: 0 10*3/uL (ref 0.0–0.1)
Basophils Relative: 1 %
Eosinophils Absolute: 0 10*3/uL (ref 0.0–0.5)
Eosinophils Relative: 0 %
HCT: 38.3 % (ref 36.0–46.0)
Hemoglobin: 13.4 g/dL (ref 12.0–15.0)
Lymphocytes Relative: 24 %
Lymphs Abs: 0.4 10*3/uL — ABNORMAL LOW (ref 0.7–4.0)
MCH: 30 pg (ref 26.0–34.0)
MCHC: 35 g/dL (ref 30.0–36.0)
MCV: 85.9 fL (ref 80.0–100.0)
Monocytes Absolute: 0.2 10*3/uL (ref 0.1–1.0)
Monocytes Relative: 14 %
Neutro Abs: 1.1 10*3/uL — ABNORMAL LOW (ref 1.7–7.7)
Neutrophils Relative %: 61 %
Platelets: 91 10*3/uL — ABNORMAL LOW (ref 150–400)
RBC: 4.46 MIL/uL (ref 3.87–5.11)
RDW: 12.2 % (ref 11.5–15.5)
WBC: 1.8 10*3/uL — ABNORMAL LOW (ref 4.0–10.5)
nRBC: 0 % (ref 0.0–0.2)

## 2022-02-11 LAB — URINALYSIS, ROUTINE W REFLEX MICROSCOPIC
Bilirubin Urine: NEGATIVE
Glucose, UA: NEGATIVE mg/dL
Hgb urine dipstick: NEGATIVE
Ketones, ur: NEGATIVE mg/dL
Leukocytes,Ua: NEGATIVE
Nitrite: NEGATIVE
Protein, ur: 30 mg/dL — AB
Specific Gravity, Urine: 1.015 (ref 1.005–1.030)
pH: 6 (ref 5.0–8.0)

## 2022-02-11 LAB — LIPASE, BLOOD: Lipase: 35 U/L (ref 11–51)

## 2022-02-11 LAB — PREGNANCY, URINE: Preg Test, Ur: NEGATIVE

## 2022-02-11 MED ORDER — ACETAMINOPHEN 500 MG PO TABS
1000.0000 mg | ORAL_TABLET | Freq: Three times a day (TID) | ORAL | Status: DC | PRN
  Administered 2022-02-11 – 2022-02-13 (×5): 1000 mg via ORAL
  Filled 2022-02-11 (×5): qty 2

## 2022-02-11 MED ORDER — LOPERAMIDE HCL 2 MG PO CAPS
2.0000 mg | ORAL_CAPSULE | ORAL | Status: DC | PRN
  Administered 2022-02-16: 2 mg via ORAL
  Filled 2022-02-11: qty 1

## 2022-02-11 MED ORDER — ONDANSETRON HCL 4 MG/2ML IJ SOLN
4.0000 mg | Freq: Four times a day (QID) | INTRAMUSCULAR | Status: DC | PRN
  Administered 2022-02-11 – 2022-02-13 (×3): 4 mg via INTRAVENOUS
  Filled 2022-02-11 (×3): qty 2

## 2022-02-11 MED ORDER — ARTESUNATE 110 MG IV SOLR (NON-CDC SUPPLY)
2.4000 mg/kg | Freq: Two times a day (BID) | INTRAVENOUS | Status: AC
  Administered 2022-02-11 – 2022-02-12 (×3): 295.7 mg via INTRAVENOUS
  Filled 2022-02-11 (×3): qty 29.57

## 2022-02-11 MED ORDER — SODIUM CHLORIDE 0.9 % IV BOLUS
1000.0000 mL | Freq: Once | INTRAVENOUS | Status: AC
  Administered 2022-02-11: 1000 mL via INTRAVENOUS

## 2022-02-11 MED ORDER — ARTEMETHER-LUMEFANTRINE 20-120 MG PO TABS: 4.0000 | ORAL_TABLET | Freq: Once | ORAL | Status: DC

## 2022-02-11 MED ORDER — ONDANSETRON HCL 4 MG/2ML IJ SOLN
4.0000 mg | Freq: Once | INTRAMUSCULAR | Status: AC
  Administered 2022-02-11: 4 mg via INTRAVENOUS
  Filled 2022-02-11: qty 2

## 2022-02-11 MED ORDER — POTASSIUM CHLORIDE 10 MEQ/100ML IV SOLN
10.0000 meq | INTRAVENOUS | Status: AC
  Administered 2022-02-11 – 2022-02-12 (×2): 10 meq via INTRAVENOUS
  Filled 2022-02-11 (×2): qty 100

## 2022-02-11 MED ORDER — SODIUM CHLORIDE 0.9 % IV SOLN
INTRAVENOUS | Status: AC
Start: 2022-02-11 — End: 2022-02-12

## 2022-02-11 MED ORDER — LOPERAMIDE HCL 2 MG PO CAPS
2.0000 mg | ORAL_CAPSULE | Freq: Once | ORAL | Status: AC
  Administered 2022-02-11: 2 mg via ORAL
  Filled 2022-02-11: qty 1

## 2022-02-11 MED ORDER — MORPHINE SULFATE (PF) 2 MG/ML IV SOLN
1.0000 mg | INTRAVENOUS | Status: DC | PRN
  Administered 2022-02-12 – 2022-02-17 (×5): 1 mg via INTRAVENOUS
  Filled 2022-02-11 (×5): qty 1

## 2022-02-11 NOTE — Discharge Instructions (Signed)
Malaria cannot be ruled out at this time and is possible.  You should follow-up with the infectious disease clinic at the number provided. ?Take the medication as prescribed. ?Return to the ED with worsening symptoms including intractable vomiting, fever, chest pain, shortness of breath, severe pain or any other concerns. ?

## 2022-02-11 NOTE — H&P (Signed)
?History and Physical  ? ? ?Maria Morgan JKD:[id number] DOB: [birth year] DOA: 02/11/2022 ? ?PCP: Patient, No Pcp Per (Inactive) ? ?Patient coming from: Home ? ?Chief Complaint: Concern for malaria ? ?HPI: Maria Morgan is a 31 y.o. 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  She presents to the ED today with complaints of fever, body aches, fatigue/dizziness, vomiting/diarrhea since after she returned from a trip to [place].  Patient went to travel clinic and received all vaccinations but did not take her malaria prophylaxis while abroad.  Vital signs stable.  WBC 1.8, platelets 91k, parasites on smear.  Potassium 3.4.  T. bili 1.4, remainder of LFTs normal.  Lipase normal.  COVID and flu negative.  UA with negative nitrite, negative leukocytes, and microscopy showing 0-5 WBCs and many bacteria.  Urine culture pending.  Blood cultures drawn.  Urine pregnancy test negative.  Chest x-ray not suggestive of pneumonia.  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  Patient was given IV artesunate, Imodium, Zofran, and 1 L normal saline bolus in the ED. ? ?Patient states she recently traveled abroad to [place] and was there for about 3 weeks.  She received vaccinations prior to travel but did not take malaria prophylaxis.  She returned from her trip on April 22 and about 5 days ago started having symptoms including fevers, chills, body aches, fatigue, lightheadedness, headaches, nausea, generalized abdominal pain, and nonbloody diarrhea.  She has not been able to eat anything due to nausea but has not vomited.  Also reports cough and shortness of breath.  Denies chest pain. ? ?Review of Systems:  ?Review of Systems  ?All other systems reviewed and are negative. ? ?Past Medical  History:  ?Diagnosis Date  ? Brain tumor (benign) ([place])   ? Chlamydia   ? Gonorrhea   ? ? ?Past Surgical History:  ?Procedure Laterality Date  ? NO PAST SURGERIES    ? ? ? reports that she has never smoked. She has never used smokeless tobacco. She reports current alcohol use. She reports that she does not use drugs. ? ?Allergies  ?Allergen Reactions  ? Iodine Shortness Of Breath and Itching  ?  Contrast in IV  ? ? ?Family History  ?Problem Relation Age of Onset  ? Cancer Mother   ? Diabetes Paternal Grandfather   ? ? ?Prior to Admission medications   ?Medication Sig Start Date End Date Taking? Authorizing Provider  ?folic acid (FOLVITE) 1 MG tablet Take 1 mg by mouth daily.   Yes [provider]  ?ibuprofen (ADVIL,MOTRIN) 600 MG tablet Take 1 tablet (600 mg total) by mouth every 6 (six) hours as needed. ?Patient taking differently: Take 600 mg by mouth every 6 (six) hours as needed for mild pain, cramping or headache. 04/17/18  Yes Law, Bea Graff, PA-C  ? ? ?Physical Exam: ?Vitals:  ? 02/11/22 1630 02/11/22 1830 02/11/22 1957 02/11/22 1957  ?BP: 123/82 119/78  (!) 146/80  ?Pulse: 80 86  (!) 105  ?Resp: '15 16  18  '$ ?Temp:    99.1 ?F (37.3 ?C)  ?TempSrc:    Oral  ?SpO2: 100% 100%  99%  ?Weight:   123.2 kg   ?Height:   '5\' 8"'$  (1.727 m)   ? ? ?Physical Exam ?Vitals reviewed.  ?Constitutional:   ?   General: She is not  in acute distress. ?   Comments: Appears lethargic  ?HENT:  ?   Head: Normocephalic and atraumatic.  ?Eyes:  ?   Extraocular Movements: Extraocular movements intact.  ?Cardiovascular:  ?   Rate and Rhythm: Normal rate and regular rhythm.  ?   Pulses: Normal pulses.  ?Pulmonary:  ?   Effort: Pulmonary effort is normal. No respiratory distress.  ?   Breath sounds: Normal breath sounds.  ?Abdominal:  ?   General: Bowel sounds are normal. There is no distension.  ?   Palpations: Abdomen is soft.  ?   Tenderness: There is no abdominal tenderness. There is no guarding or rebound.  ?Musculoskeletal:     ?    General: No swelling or tenderness.  ?   Cervical back: Normal range of motion.  ?Skin: ?   General: Skin is warm and dry.  ?Neurological:  ?   General: No focal deficit present.  ?   Mental Status: She is alert and oriented to person, place, and time.  ?   Cranial Nerves: No cranial nerve deficit.  ?   Sensory: No sensory deficit.  ?   Motor: No weakness.  ?  ? ?Labs on Admission: I have personally reviewed following labs and imaging studies ? ?CBC: ?Recent Labs  ?Lab 02/11/22 ?1439  ?WBC 1.8*  ?NEUTROABS 1.1*  ?HGB 13.4  ?HCT 38.3  ?MCV 85.9  ?PLT 91*  ? ?Basic Metabolic Panel: ?Recent Labs  ?Lab 02/11/22 ?1439  ?NA 136  ?K 3.4*  ?CL 104  ?CO2 25  ?GLUCOSE 98  ?BUN 11  ?CREATININE 0.93  ?CALCIUM 8.7*  ? ?GFR: ?Estimated Creatinine Clearance: 122.3 mL/min (by C-G formula based on SCr of 0.93 mg/dL). ?Liver Function Tests: ?Recent Labs  ?Lab 02/11/22 ?1439  ?AST 37  ?ALT 38  ?ALKPHOS 53  ?BILITOT 1.4*  ?PROT 7.3  ?ALBUMIN 3.3*  ? ?Recent Labs  ?Lab 02/11/22 ?1439  ?LIPASE 35  ? ?No results for input(s): AMMONIA in the last 168 hours. ?Coagulation Profile: ?No results for input(s): INR, PROTIME in the last 168 hours. ?Cardiac Enzymes: ?No results for input(s): CKTOTAL, CKMB, CKMBINDEX, TROPONINI in the last 168 hours. ?BNP (last 3 results) ?No results for input(s): PROBNP in the last 8760 hours. ?HbA1C: ?No results for input(s): HGBA1C in the last 72 hours. ?CBG: ?No results for input(s): GLUCAP in the last 168 hours. ?Lipid Profile: ?No results for input(s): CHOL, HDL, LDLCALC, TRIG, CHOLHDL, LDLDIRECT in the last 72 hours. ?Thyroid Function Tests: ?No results for input(s): TSH, T4TOTAL, FREET4, T3FREE, THYROIDAB in the last 72 hours. ?Anemia Panel: ?No results for input(s): VITAMINB12, FOLATE, FERRITIN, TIBC, IRON, RETICCTPCT in the last 72 hours. ?Urine analysis: ?   ?Component Value Date/Time  ? COLORURINE YELLOW 02/11/2022 1440  ? APPEARANCEUR CLEAR 02/11/2022 1440  ? LABSPEC 1.015 02/11/2022 1440  ? PHURINE  6.0 02/11/2022 1440  ? GLUCOSEU NEGATIVE 02/11/2022 1440  ? HGBUR NEGATIVE 02/11/2022 1440  ? BILIRUBINUR NEGATIVE 02/11/2022 1440  ? KETONESUR NEGATIVE 02/11/2022 1440  ? PROTEINUR 30 (A) 02/11/2022 1440  ? UROBILINOGEN 1.0 08/11/2013 2010  ? NITRITE NEGATIVE 02/11/2022 1440  ? LEUKOCYTESUR NEGATIVE 02/11/2022 1440  ? ? ?Radiological Exams on Admission: I have personally reviewed images ?DG Chest 2 View ? ?Result Date: 02/11/2022 ?CLINICAL DATA:  Fever and cough. Recent travel to [place] with concern for malaria. EXAM: CHEST - 2 VIEW COMPARISON:  Radiographs 02/28/2017 FINDINGS: The heart size and mediastinal contours are normal. The lungs are clear. There is  no pleural effusion or pneumothorax. No acute osseous findings are identified. IMPRESSION: Stable chest.  No evidence of active cardiopulmonary process. Electronically Signed   By: Richardean Sale M.D.   On: 02/11/2022 15:41   ? ?EKG: Independently reviewed.  Sinus rhythm, no acute changes. ? ?Assessment and Plan ? ?Suspected malaria ?Patient presenting with complaints of fevers, chills, body aches, fatigue, lightheadedness, headaches, nausea, generalized abdominal pain, and nonbloody diarrhea x5 days after recently returning from a 3-week trip to [place] and did not take malaria prophylaxis. Vital signs stable.  WBC 1.8, platelets 91k, parasites on smear.  COVID and flu negative.  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 ?-Continue IV artesunate ?-Symptomatic management ?-Plasmodium PCR pending ?-Blood cultures pending ?-Monitor CBC ? ?Headaches ?Likely due to malaria.  No focal neurodeficit. ?-Symptomatic management ? ?Nausea and generalized abdominal pain ?Symptoms likely related to malaria.  Abdominal exam benign.  Patient denies any episodes of vomiting.  COVID and flu negative.  Lipase normal.  No significant elevation of LFTs.  UA not strongly suggestive of infection, culture  pending.  Urine pregnancy test negative. ?-Zofran as needed ?-IV fluid hydration ? ?Mild hypokalemia ?Likely due to poor oral intake. ?-Replace potassium.  Check magnesium level and replace if low.  Monitor

## 2022-02-11 NOTE — ED Notes (Signed)
Dr. Rancour at bedside. 

## 2022-02-11 NOTE — Progress Notes (Addendum)
Plan of Care Note for accepted transfer ? ? ?Patient: Maria Morgan MRN: [id number]   [place]: 02/11/2022 ? ?Facility requesting transfer: [facility] ?Requesting Provider: Dr. Wyvonnia Dusky ?Reason for transfer: malaria with leukopenia, thrombocytopenia ? ?Facility course: 28 yow presented to the emergency department with episodic fever, body aches, fatigue/dizziness, vomiting/diarrhea over the past 6 days. Patient recently returned from a 2-week trip to [place].  She went to travel clinic and received all vaccination but did not take her malaria prophylaxis while in [place].  ? ?WBC 1.8 ?Plts 91 ?Parasites on smear ? ?AF VSS and benign exam per Dr. Wyvonnia Dusky ?Feels poorly ?ID recommended OBS ? ?Plan of care: ?The patient is accepted for admission to [place]  unit, at [facility] ID to see in consult. ? ?Dr. Linus Salmons recommends admission to the hospitalist service for IV Artesunate . ? ? ?Author: ?Murray Hodgkins, MD ?02/11/2022 ? ?Check [email or web address] for on-call coverage. ? ?Nursing staff, Please call [facility] number on Amion as soon as patient's arrival, so appropriate admitting provider can evaluate the pt. ? ?No charge note ?

## 2022-02-11 NOTE — Consult Note (Signed)
?  [facility] for Infectious Disease  ? ? ? ? ? ?Reason for Consult:probable malaria    ?Referring Physician: Dr. Wyvonnia Dusky ? ?Active Problems: ?  * No active hospital problems. * ? ? ? artemether-lumefantrine  4 tablet Oral Once  ? artesunate  2.4 mg/kg Intravenous Q12H  ? ? ?Recommendations: ?IV artesunate ?Repeat CBC in the am ?Full consult to follow in the am ? ?Assessment: ?She has returned from [place] and parasite screen positive most c/w malaria.  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  I do suspect though this will decrease.  Therefore, I do recommend inpatient treatment with IV artesunate.  Pharmacy aware at [facility].   ? ?Antibiotics: ? ? ?HPI: Maria Morgan is a 31 y.o. female with recent travel to [place].  Did get malarone for prophylaxis.   ? ? ? ?Past Medical History:  ?Diagnosis Date  ? Brain tumor (benign) ([place])   ? Chlamydia   ? Gonorrhea   ? ? ?Social History  ? ?Tobacco Use  ? Smoking status: Never  ? Smokeless tobacco: Never  ?Vaping Use  ? Vaping Use: Never used  ?Substance Use Topics  ? Alcohol use: Yes  ?  Comment: occasional  ? Drug use: No  ? ? ?Family History  ?Problem Relation Age of Onset  ? Cancer Mother   ? Diabetes Paternal Grandfather   ? ? ?Allergies  ?Allergen Reactions  ? Iodine Shortness Of Breath and Itching  ?  Contrast in IV  ? ? ?Physical Exam ?Vitals:  ? 02/11/22 1549 02/11/22 1630  ?BP: 119/78 123/82  ?Pulse: 80 80  ?Resp: 10 15  ?Temp:    ?SpO2: 99% 100%  ? ? ?Lab Results  ?Component Value Date  ? WBC 1.8 (L) 02/11/2022  ? HGB 13.4 02/11/2022  ? HCT 38.3 02/11/2022  ? MCV 85.9 02/11/2022  ? PLT 91 (L) 02/11/2022  ?  ?Lab Results  ?Component Value Date  ? CREATININE 0.93 02/11/2022  ? BUN 11 02/11/2022  ? NA 136 02/11/2022  ? K 3.4 (L) 02/11/2022  ? CL 104 02/11/2022  ? CO2 25  02/11/2022  ?  ?Lab Results  ?Component Value Date  ? ALT 38 02/11/2022  ? AST 37 02/11/2022  ? ALKPHOS 53 02/11/2022  ?  ? ?Microbiology: ?Recent Results (from the past 240 hour(s))  ?Resp Panel by RT-PCR (Flu A&B, Covid) Nasopharyngeal Swab     Status: None  ? Collection Time: 02/11/22  1:15 PM  ? Specimen: Nasopharyngeal Swab; Nasopharyngeal(NP) swabs in vial transport medium  ?Result Value Ref Range Status  ? SARS Coronavirus 2 by RT PCR NEGATIVE NEGATIVE Final  ?  Comment: (NOTE) ?SARS-CoV-2 target nucleic acids are NOT DETECTED. ? ?The SARS-CoV-2 RNA is generally detectable in upper respiratory ?specimens during the acute phase of infection. The lowest ?concentration of SARS-CoV-2 viral copies this assay can detect is ?138 copies/mL. A negative result does not preclude SARS-Cov-2 ?infection and should not be used as the sole basis for treatment or ?other patient management decisions. A negative result may occur with  ?improper specimen collection/handling, submission of specimen other ?than nasopharyngeal swab, presence of viral mutation(s) within the ?areas targeted by this assay, and inadequate number of viral ?copies(<138 copies/mL). A negative result must be combined with ?clinical observations, patient history, and  epidemiological ?information. The expected result is Negative. ? ?Fact Sheet for Patients:  ?EntrepreneurPulse.com.au ? ?Fact Sheet for Healthcare Providers:  ?IncredibleEmployment.be ? ?This test is no t yet approved or cleared by the [place] FDA and  ?has been authorized for detection and/or diagnosis of SARS-CoV-2 by ?FDA under an Emergency Use Authorization (EUA). This EUA will remain  ?in effect (meaning this test can be used) for the duration of the ?COVID-19 declaration under Section 564(b)(1) of the Act, 21 ?U.S.C.section 360bbb-3(b)(1), unless the authorization is terminated  ?or revoked sooner.  ? ? ?  ? Influenza A by PCR NEGATIVE NEGATIVE  Final  ? Influenza B by PCR NEGATIVE NEGATIVE Final  ?  Comment: (NOTE) ?The Xpert Xpress SARS-CoV-2/FLU/RSV plus assay is intended as an aid ?in the diagnosis of influenza from Nasopharyngeal swab specimens and ?should not be used as a sole basis for treatment. Nasal washings and ?aspirates are unacceptable for Xpert Xpress SARS-CoV-2/FLU/RSV ?testing. ? ?Fact Sheet for Patients: ?EntrepreneurPulse.com.au ? ?Fact Sheet for Healthcare Providers: ?IncredibleEmployment.be ? ?This test is not yet approved or cleared by the [place] FDA and ?has been authorized for detection and/or diagnosis of SARS-CoV-2 by ?FDA under an Emergency Use Authorization (EUA). This EUA will remain ?in effect (meaning this test can be used) for the duration of the ?COVID-19 declaration under Section 564(b)(1) of the Act, 21 U.S.C. ?section 360bbb-3(b)(1), unless the authorization is terminated or ?revoked. ? ?Performed at [facility], [address] ?[place], [place] [address] ?  ? ? ?Thayer Headings, MD ?[facility] ?[place] ?[email or web address] ?02/11/2022, 5:02 PM  ?

## 2022-02-11 NOTE — ED Notes (Signed)
Pharmacy consult for medication dosing.  Will send via courier, if pt transferred to inpatient bed RN to call pharmacy to hold ?

## 2022-02-11 NOTE — ED Triage Notes (Signed)
Pt presents with concerns of malaria. Returned from [place] on 02/02/22 and symptoms began on Thursday. Pt reports did not take meds prescribed for her prior to trip. Reports fever, N/V/D, chills, weakness, dizziness ?

## 2022-02-11 NOTE — ED Provider Notes (Signed)
? ?Emergency Department Provider Note ? ? ?I have reviewed the triage vital signs and the nursing notes. ? ? ?HISTORY ? ?Chief Complaint ?Generalized Body Aches ? ? ?HPI ?Maria Morgan is a 31 y.o. female with past medical history reviewed below presents to the emergency department with episodic fever, body aches, fatigue/dizziness, vomiting/diarrhea over the past 6 days.  Patient recently returned from a 2-week trip to [place].  She went to travel clinic and received all vaccination but did not take her malaria prophylaxis while in [place].  Since returned, she has had the above symptoms.  No rash.  No confusion or altered mental status.  She is feeling mainly body aches and dizziness and describes severe, nonbloody diarrhea.  She is feeling very nauseated as well with some occasional vomiting.  No focal abdominal pain/chest discomfort.  She notes that she has had some cough. ? ? ?Past Medical History:  ?Diagnosis Date  ? Brain tumor (benign) ([place])   ? Chlamydia   ? Gonorrhea   ? ? ?Review of Systems ? ?Constitutional: Positive fever/chills, body aches, and fatigue.  ?Eyes: No visual changes. ?ENT: No sore throat. ?Cardiovascular: Denies chest pain. ?Respiratory: Denies shortness of breath. Positive cough.  ?Gastrointestinal: No abdominal pain. Positive nausea, vomiting, and diarrhea.  No constipation. ?Genitourinary: Negative for dysuria. ?Musculoskeletal: Positive diffuse myalgias.  ?Skin: Negative for rash. ?Neurological: Negative for focal weakness or numbness. ? ? ?____________________________________________ ? ? ?PHYSICAL EXAM: ? ?VITAL SIGNS: ?ED Triage Vitals  ?Enc Vitals Group  ?   BP 02/11/22 1300 (!) 129/97  ?   Pulse Rate 02/11/22 1300 93  ?   Resp 02/11/22 1300 18  ?   Temp 02/11/22 1300 98.2 ?F (36.8 ?C)  ?   Temp src --   ?   SpO2 02/11/22 1300 100 %  ?   Weight 02/11/22 1259 274 lb (124.3 kg)  ? ?Constitutional: Alert and oriented. Well appearing and in no acute distress. ?Eyes: Conjunctivae are  normal.  ?Head: Atraumatic. ?Nose: No congestion/rhinnorhea. ?Mouth/Throat: Mucous membranes are moist.   ?Neck: No stridor.   ?Cardiovascular: Normal rate, regular rhythm. Good peripheral circulation. Grossly normal heart sounds.   ?Respiratory: Normal respiratory effort.  No retractions. Lungs CTAB. ?Gastrointestinal: Soft and nontender. No distention.  ?Musculoskeletal: No lower extremity tenderness nor edema. No gross deformities of extremities. ?Neurologic:  Normal speech and language. No gross focal neurologic deficits are appreciated.  ?Skin:  Skin is warm, dry and intact. No rash noted. ? ?____________________________________________ ?  ?LABS ?(all labs ordered are listed, but only abnormal results are displayed) ? ?Labs Reviewed  ?COMPREHENSIVE METABOLIC PANEL - Abnormal; Notable for the following components:  ?    Result Value  ? Potassium 3.4 (*)   ? Calcium 8.7 (*)   ? Albumin 3.3 (*)   ? Total Bilirubin 1.4 (*)   ? All other components within normal limits  ?CBC WITH DIFFERENTIAL/PLATELET - Abnormal; Notable for the following components:  ? WBC 1.8 (*)   ? Platelets 91 (*)   ? Neutro Abs 1.1 (*)   ? Lymphs Abs 0.4 (*)   ? All other components within normal limits  ?URINALYSIS, ROUTINE W REFLEX MICROSCOPIC - Abnormal; Notable for the following components:  ? Protein, ur 30 (*)   ? All other components within normal limits  ?URINALYSIS, MICROSCOPIC (REFLEX) - Abnormal; Notable for the following components:  ? Bacteria, UA MANY (*)   ? All other components within normal limits  ?RESP PANEL BY RT-PCR (FLU  A&B, COVID) [id number]  ?URINE CULTURE  ?CULTURE, BLOOD (ROUTINE X 2)  ?CULTURE, BLOOD (ROUTINE X 2)  ?GASTROINTESTINAL PANEL BY PCR, STOOL (REPLACES STOOL CULTURE)  ?URINE CULTURE  ?LIPASE, BLOOD  ?PREGNANCY, URINE  ?PLASMODIUM SP. PCR  ?PARASITE EXAM SCREEN, BLOOD-W CONF TO [facility] (NOT @ [place])  ?HIV ANTIBODY (ROUTINE TESTING W REFLEX)  ?COMPREHENSIVE METABOLIC PANEL  ?CBC  ?MAGNESIUM   ? ?____________________________________________ ? ?EKG ? ? EKG Interpretation ? ?Date/Time:  Monday Feb 11 2022 15:49:35 EDT ?Ventricular Rate:  75 ?PR Interval:  169 ?QRS Duration: 94 ?QT Interval:  383 ?QTC Calculation: 428 ?R Axis:   70 ?Text Interpretation: Sinus rhythm No significant change was found Confirmed by Ezequiel Essex [phone]) on 02/11/2022 3:56:31 PM ?  ? ?  ? ? ?____________________________________________ ? ?RADIOLOGY ? ?DG Chest 2 View ? ?Result Date: 02/11/2022 ?CLINICAL DATA:  Fever and cough. Recent travel to [place] with concern for malaria. EXAM: CHEST - 2 VIEW COMPARISON:  Radiographs 02/28/2017 FINDINGS: The heart size and mediastinal contours are normal. The lungs are clear. There is no pleural effusion or pneumothorax. No acute osseous findings are identified. IMPRESSION: Stable chest.  No evidence of active cardiopulmonary process. Electronically Signed   By: Richardean Sale M.D.   On: 02/11/2022 15:41   ? ?____________________________________________ ? ? ?PROCEDURES ? ?Procedure(s) performed:  ? ?Procedures ? ?None ?____________________________________________ ? ? ?INITIAL IMPRESSION / ASSESSMENT AND PLAN / ED COURSE ? ?Pertinent labs & imaging results that were available during my care of the patient were reviewed by me and considered in my medical decision making (see chart for details). ?  ?This patient is Presenting for Evaluation of fever, which does require a range of treatment options, and is a complaint that involves a high risk of morbidity and mortality. ? ?The Differential Diagnoses include viral illness, developing sepsis, bacteremia, PNA, malaria, traveler's diarrhea. ? ?Critical Interventions-  ?  ?Medications  ?artesunate 110 MG IV syringe 295.7 mg (295.7 mg Intravenous Given 02/11/22 2144)  ?loperamide (IMODIUM) capsule 2 mg (has no administration in time range)  ?ondansetron [facility]) injection 4 mg (4 mg Intravenous Given 02/12/22 0620)  ?0.9 %  sodium chloride infusion (  Intravenous Infusion Verify 02/12/22 0735)  ?acetaminophen (TYLENOL) tablet 1,000 mg (1,000 mg Oral Given 02/11/22 2325)  ?morphine (PF) 2 MG/ML injection 1 mg (1 mg Intravenous Given 02/12/22 0600)  ?sodium chloride 0.9 % bolus 1,000 mL (0 mLs Intravenous Stopped 02/11/22 1624)  ?ondansetron (ZOFRAN) injection 4 mg (4 mg Intravenous Given 02/11/22 1442)  ?loperamide (IMODIUM) capsule 2 mg (2 mg Oral Given 02/11/22 1442)  ?potassium chloride 10 mEq in 100 mL IVPB (10 mEq Intravenous New Bag/Given 02/12/22 0153)  ? ? ?Reassessment after intervention: Patient continuing to look well.  ? ? ?I decided to review pertinent External Data, and in summary patient seen at [facility] travel clinic in January. ?  ?Clinical Laboratory Tests Ordered, included COVID and flu negative.  CBC with platelets of 91 and leukopenia with WBC count of 1.8.  No anemia.  No acute kidney injury or electrolyte disturbance.  Blood cultures as well as Plasmodium PCR and smear is sent. ? ?Radiology Tests Ordered and independently evaluated CXR. Agree with radiology interpretation.  ? ?Cardiac Monitor Tracing which shows NSR ? ? ?Social Determinants of Health Risk patient is a non-smoker.  ? ?Consult complete with Infectious Disease.  ? ?Medical Decision Making: Summary:  ?Patient presents emergency department with fever, body aches, nausea/vomiting/diarrhea after returning from a trip to [place].  She did not take her malaria prophylaxis and this is a consideration.  Have sent off Plasmodium PCR as well as blood smear.  Plan to touch base with infectious disease.  We will send off labs as well as blood cultures and stool cultures if patient is able to provide testing.  Considered abdominal imaging but no focal tenderness on exam to suspect acute surgical process in the abdomen. ? ?Care transferred to Dr. Wyvonnia Dusky pending remaining labs and ID discussion. ? ? ?Disposition: pending ? ?____________________________________________ ? ?FINAL CLINICAL IMPRESSION(S) /  ED DIAGNOSES ? ?Final diagnoses:  ?Malaria  ? ? ? ?Note:  This document was prepared using Dragon voice recognition software and may include unintentional dictation errors. ? ?Nanda Quinton, MD, FACEP ?Emergency Medicine ? ?  ?[place]

## 2022-02-11 NOTE — Progress Notes (Signed)
Pharmacy Antibiotic Note ? ?Maria Morgan is a 31 y.o. female for which pharmacy has been consulted for IV artesunate dosing for  suspected malaria infection . ? ?Patient with a history of brain tumor (benign), chlamydia, gonorrhoea. Patient presenting with fever, body aches, fatigue/dizziness, vomiting/diarrhea over past week. Patient recently returned from a 2-week trip to [place], stating did not take malaria prophylaxis while there. ? ?SCr 0.93 - at baseline ?WBC 1.8; ANC 1.1; T 98.2 F; HR 80; RR 16 ?COVID/Flu - neg ?Plt 91 ?Parasite noted on RBC morphology from CBC w/ Diff ? ?Plan: ?IV artesunate 2.4 mg/kg/dose ((2.'4mg'$ /kg)(124.3 kg) = 29.83 mL => '300mg'$ ) at 0, 12, and 24 hours. Will start on arrival to [facility]. ? ?Measure percent parasitemia after last dose: ?<1%, transition to oral therapy at least 4 hours after the last dose of artesunate (required) ?>1%, continue artesunate q24h until less than 1%, up to 7 days ? ?Oral follow-on options include:  ?1. Coartem for 3 days (preferred)  ?2. Malarone for 3 days  ?3. Quinine + doxycycline for 7 days ? ?F/u malaria work up ?F/u ID recommendations ? ?Weight: 124.3 kg (274 lb) ? ?Temp (24hrs), Avg:98.2 ?F (36.8 ?C), Min:98.2 ?F (36.8 ?C), Max:98.2 ?F (36.8 ?C) ? ?Recent Labs  ?Lab 02/11/22 ?1439  ?WBC 1.8*  ?CREATININE 0.93  ?  ?CrCl cannot be calculated (Unknown ideal weight.).   ? ?Allergies  ?Allergen Reactions  ? Iodine Shortness Of Breath and Itching  ?  Contrast in IV  ? ?Microbiology results: ?Pending ? ?Thank you for allowing pharmacy to be a part of this patient?s care. ? ?Lorelei Pont, PharmD, BCPS ?02/11/2022 5:30 PM ?ED Clinical Pharmacist -  [phone] ?  ?

## 2022-02-11 NOTE — ED Provider Notes (Signed)
Care assumed from Dr. Laverta Baltimore.  Patient with concern for malaria after recent travel to [place].  She has body aches, chills, nausea, vomiting, diarrhea and fevers.  Labs are pending. ? ?Discussed with Dr. Linus Salmons of infectious disease.  He agrees patient can likely be discharged to follow-up in the clinic.  He does recommend a course of p.o. medications prophylactically. ? ?CBC is concerning for parasites.  Leukopenia as well as thrombocytopenia noted. ? ?Discussed again with infectious disease Dr. Linus Salmons as well as pharmacy. ? ?Patient still feels quite poorly with headache and dizziness.  Given her leukopenia Case was again discussed with Dr. Linus Salmons with infectious disease who recommends admission to the hospitalist service for IV Artesunate which is not available at this facility ? ?Admission d/w Dr. Sarajane Jews.  ?  ?Ezequiel Essex, MD ?02/11/22 1713 ? ?

## 2022-02-11 NOTE — ED Notes (Signed)
Attempt to collect stool specimen, unable to provide at this time.   ?

## 2022-02-12 ENCOUNTER — Observation Stay (HOSPITAL_COMMUNITY): Payer: 59

## 2022-02-12 ENCOUNTER — Inpatient Hospital Stay (HOSPITAL_COMMUNITY)
Admit: 2022-02-12 | Discharge: 2022-02-12 | Disposition: A | Discharge status: Home / Self Care | Payer: 59 | Attending: Student in an Organized Health Care Education/Training Program | Admitting: Student in an Organized Health Care Education/Training Program

## 2022-02-12 ENCOUNTER — Other Ambulatory Visit (HOSPITAL_COMMUNITY): Payer: Self-pay

## 2022-02-12 ENCOUNTER — Inpatient Hospital Stay (HOSPITAL_COMMUNITY): Payer: 59

## 2022-02-12 DIAGNOSIS — E221 Hyperprolactinemia: Secondary | ICD-10-CM | POA: Diagnosis present

## 2022-02-12 DIAGNOSIS — A4189 Other specified sepsis: Secondary | ICD-10-CM | POA: Diagnosis not present

## 2022-02-12 DIAGNOSIS — Z91041 Radiographic dye allergy status: Secondary | ICD-10-CM | POA: Diagnosis not present

## 2022-02-12 DIAGNOSIS — R7401 Elevation of levels of liver transaminase levels: Secondary | ICD-10-CM | POA: Diagnosis not present

## 2022-02-12 DIAGNOSIS — G9340 Encephalopathy, unspecified: Secondary | ICD-10-CM | POA: Diagnosis not present

## 2022-02-12 DIAGNOSIS — D352 Benign neoplasm of pituitary gland: Secondary | ICD-10-CM | POA: Diagnosis present

## 2022-02-12 DIAGNOSIS — B509 Plasmodium falciparum malaria, unspecified: Secondary | ICD-10-CM | POA: Diagnosis present

## 2022-02-12 DIAGNOSIS — E876 Hypokalemia: Secondary | ICD-10-CM | POA: Diagnosis present

## 2022-02-12 DIAGNOSIS — Z20822 Contact with and (suspected) exposure to covid-19: Secondary | ICD-10-CM | POA: Diagnosis present

## 2022-02-12 DIAGNOSIS — A419 Sepsis, unspecified organism: Secondary | ICD-10-CM | POA: Diagnosis not present

## 2022-02-12 DIAGNOSIS — Z833 Family history of diabetes mellitus: Secondary | ICD-10-CM | POA: Diagnosis not present

## 2022-02-12 DIAGNOSIS — G929 Unspecified toxic encephalopathy: Secondary | ICD-10-CM | POA: Diagnosis not present

## 2022-02-12 DIAGNOSIS — B54 Unspecified malaria: Secondary | ICD-10-CM | POA: Diagnosis not present

## 2022-02-12 DIAGNOSIS — B179 Acute viral hepatitis, unspecified: Secondary | ICD-10-CM | POA: Diagnosis not present

## 2022-02-12 DIAGNOSIS — R6520 Severe sepsis without septic shock: Secondary | ICD-10-CM | POA: Diagnosis not present

## 2022-02-12 DIAGNOSIS — F909 Attention-deficit hyperactivity disorder, unspecified type: Secondary | ICD-10-CM | POA: Diagnosis present

## 2022-02-12 DIAGNOSIS — D6959 Other secondary thrombocytopenia: Secondary | ICD-10-CM | POA: Diagnosis present

## 2022-02-12 DIAGNOSIS — R52 Pain, unspecified: Secondary | ICD-10-CM | POA: Diagnosis present

## 2022-02-12 DIAGNOSIS — Z809 Family history of malignant neoplasm, unspecified: Secondary | ICD-10-CM | POA: Diagnosis not present

## 2022-02-12 DIAGNOSIS — D709 Neutropenia, unspecified: Secondary | ICD-10-CM | POA: Diagnosis not present

## 2022-02-12 DIAGNOSIS — Z6841 Body Mass Index (BMI) 40.0 and over, adult: Secondary | ICD-10-CM | POA: Diagnosis not present

## 2022-02-12 DIAGNOSIS — R4182 Altered mental status, unspecified: Secondary | ICD-10-CM | POA: Diagnosis not present

## 2022-02-12 LAB — COMPREHENSIVE METABOLIC PANEL
ALT: 33 U/L (ref 0–44)
AST: 39 U/L (ref 15–41)
Albumin: 3 g/dL — ABNORMAL LOW (ref 3.5–5.0)
Alkaline Phosphatase: 47 U/L (ref 38–126)
Anion gap: 6 (ref 5–15)
BUN: 11 mg/dL (ref 6–20)
CO2: 24 mmol/L (ref 22–32)
Calcium: 7.8 mg/dL — ABNORMAL LOW (ref 8.9–10.3)
Chloride: 104 mmol/L (ref 98–111)
Creatinine, Ser: 1.02 mg/dL — ABNORMAL HIGH (ref 0.44–1.00)
GFR, Estimated: >60 mL/min (ref >60)
Glucose, Bld: 144 mg/dL — ABNORMAL HIGH (ref 70–99)
Potassium: 3.6 mmol/L (ref 3.5–5.1)
Sodium: 134 mmol/L — ABNORMAL LOW (ref 135–145)
Total Bilirubin: 1.1 mg/dL (ref 0.3–1.2)
Total Protein: 6.2 g/dL — ABNORMAL LOW (ref 6.5–8.1)

## 2022-02-12 LAB — URINE CULTURE: Culture: NO GROWTH

## 2022-02-12 LAB — CSF CELL COUNT WITH DIFFERENTIAL
RBC Count, CSF: 1 /mm3 — ABNORMAL HIGH
Tube #: 4
WBC, CSF: 2 /mm3 (ref 0–5)

## 2022-02-12 LAB — CBC
HCT: 34.8 % — ABNORMAL LOW (ref 36.0–46.0)
Hemoglobin: 12.1 g/dL (ref 12.0–15.0)
MCH: 30.5 pg (ref 26.0–34.0)
MCHC: 34.8 g/dL (ref 30.0–36.0)
MCV: 87.7 fL (ref 80.0–100.0)
Platelets: 74 10*3/uL — ABNORMAL LOW (ref 150–400)
RBC: 3.97 MIL/uL (ref 3.87–5.11)
RDW: 12.2 % (ref 11.5–15.5)
WBC: 2.2 10*3/uL — ABNORMAL LOW (ref 4.0–10.5)
nRBC: 0 % (ref 0.0–0.2)

## 2022-02-12 LAB — HIV ANTIBODY (ROUTINE TESTING W REFLEX): HIV Screen 4th Generation wRfx: NONREACTIVE

## 2022-02-12 LAB — MAGNESIUM: Magnesium: 1.8 mg/dL (ref 1.7–2.4)

## 2022-02-12 LAB — PROTEIN AND GLUCOSE, CSF
Glucose, CSF: 63 mg/dL (ref 40–70)
Total  Protein, CSF: 17 mg/dL (ref 15–45)

## 2022-02-12 LAB — GLUCOSE, CAPILLARY: Glucose-Capillary: 116 mg/dL — ABNORMAL HIGH (ref 70–99)

## 2022-02-12 MED ORDER — GADOBUTROL 1 MMOL/ML IV SOLN
10.0000 mL | Freq: Once | INTRAVENOUS | Status: AC | PRN
  Administered 2022-02-12: 10 mL via INTRAVENOUS

## 2022-02-12 MED ORDER — VANCOMYCIN HCL IN DEXTROSE 1-5 GM/200ML-% IV SOLN
1000.0000 mg | Freq: Three times a day (TID) | INTRAVENOUS | Status: DC
  Administered 2022-02-13: 1000 mg via INTRAVENOUS
  Filled 2022-02-12 (×2): qty 200

## 2022-02-12 MED ORDER — SODIUM CHLORIDE 0.9 % IV SOLN: INTRAVENOUS | Status: DC

## 2022-02-12 MED ORDER — SODIUM CHLORIDE 0.9 % IV SOLN
2.0000 g | Freq: Two times a day (BID) | INTRAVENOUS | Status: DC
  Administered 2022-02-12 – 2022-02-13 (×2): 2 g via INTRAVENOUS
  Filled 2022-02-12 (×3): qty 20

## 2022-02-12 MED ORDER — IOHEXOL 350 MG/ML SOLN
75.0000 mL | Freq: Once | INTRAVENOUS | Status: AC | PRN
  Administered 2022-02-12: 75 mL via INTRAVENOUS

## 2022-02-12 MED ORDER — VANCOMYCIN HCL 2000 MG/400ML IV SOLN
2000.0000 mg | Freq: Once | INTRAVENOUS | Status: AC
  Administered 2022-02-12: 2000 mg via INTRAVENOUS
  Filled 2022-02-12: qty 400

## 2022-02-12 NOTE — Progress Notes (Addendum)
?PROGRESS NOTE ? ? ? ?Maria Morgan  UJW:[id number] DOB: 17-Dec-1990 DOA: 02/11/2022 ?PCP: Patient, No Pcp Per (Inactive) ? ? ?Brief Narrative:  ?HPI: Maria Morgan is a 31 y.o. 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  She presents to the ED with complaints of fever, body aches, fatigue/dizziness, vomiting/diarrhea since after she returned from a trip to [place].  Patient went to travel clinic and received all vaccinations but did not take her malaria prophylaxis while abroad.  Vital signs stable.  WBC 1.8, platelets 91k, parasites on smear.  Potassium 3.4.  T. bili 1.4, remainder of LFTs normal.  Lipase normal.  COVID and flu negative.  UA with negative nitrite, negative leukocytes, and microscopy showing 0-5 WBCs and many bacteria.  Urine culture pending.  Blood cultures drawn.  Urine pregnancy test negative.  Chest x-ray not suggestive of pneumonia.  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  Patient was given IV artesunate, Imodium, Zofran, and 1 L normal saline bolus in the ED. ?  ?Patient states she recently traveled abroad to [place] and was there for about 3 weeks.  She received vaccinations prior to travel but did not take malaria prophylaxis.  She returned from her trip on April 22 and about 5 days ago started having symptoms including fevers, chills, body aches, fatigue, lightheadedness, headaches, nausea, generalized abdominal pain, and nonbloody diarrhea.  She has not been able to eat anything due to nausea but has not vomited.  Also reports cough and shortness of breath.  Denies chest pain. ?  ? ?Assessment & Plan: ?  ?Principal Problem: ?  Malaria ?Active Problems: ?  Pituitary adenoma ([place]) ?  Hypokalemia ?  Obesity, Class III, BMI 40-49.9 (morbid  obesity) ([place]) ?  Acute encephalopathy ?  Severe sepsis ([place]) ? ?Severe sepsis secondary to suspected malaria: Patient meets severe sepsis criteria based on temperature, tachycardia and leukopenia and acute encephalopathy now, sepsis was not present on admission. ?Patient presenting with complaints of fevers, chills, body aches, fatigue, lightheadedness, headaches, nausea, generalized abdominal pain, and nonbloody diarrhea x5 days after recently returning from a 3-week trip to [place] and did not take malaria prophylaxis. Vital signs stable.  WBC 1.8, platelets 91k, parasites on smear.  COVID and flu negative.  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  Per discussion with ID, they were able to see> 5% Plasmodium on the blood sample.  Patient is having intermittent high-grade fever.  Appreciate ID help. ?  ?Acute toxic encephalopathy: At around 11 AM today, patient suddenly became encephalopathic and unresponsive.  Code stroke and rapid response was called.  Patient ended up having CT head and CT angio head and neck which was unremarkable.  Seen by telemetry neurology.  They are pursuing MRI brain as well as an EEG.  Lumbar puncture is recommended by neurology to look for bacterial meningitis and cerebral malaria.  IR has been consulted for that.  Labs to be ordered by ID.  Patient appears to have alert with eyes open but has neglecting behavior intermittently.  Hemodynamically stable.  To note is that patient was afebrile when this episode happened.  She was febrile an hour before that. ? ?Acute leukopenia/thrombocytopenia: Could be due to malaria.  Monitor. ? ?Possible STD: Patient's best friend is at the bedside.  She tells me that she is aware that patient had unprotected sexual intercourse multiple times with one partner while she was in [place].  She was requesting to test for STDs.  I have ordered those. ?  ?Mild hypokalemia: Resolved. ?   ?Borderline hyperbilirubinemia: Could be due to malaria.  Resolved. ?  ?Shortness of breath and cough ?Lungs clear on exam and chest x-ray not suggestive of pneumonia.  Not hypoxia or respiratory distress.  COVID and flu negative. ?-Continue to monitor.  She has no more cough or shortness of breath. ?  ?Nonbloody diarrhea ?Likely due to malaria.  Abdominal exam benign.  GI pathogen panel and C. difficile pending.  She has not had any further bowel movement since admission. ?  ?History of pituitary adenoma associated with hyperprolactinemia ?Not on any medications at this time. ?-Outpatient endocrinology follow-up. ?  ?Class III obesity (BMI 41.30) ?-Encourage lifestyle modifications/healthy diet and exercise after she recovers from her acute illness. ? ?Morbid obesity: Will need counseling regarding weight loss when she is better from acute illness. ? ?DVT prophylaxis: SCDs Start: 02/11/22 2257 ?  Code Status: Full Code  ?Family Communication: Friend present at bedside.  When I saw her earlier, patient was alert and plan of care was discussed with her.  Patient's grandmother showed up later after my second visit and ID had updated her as well. ? ?Status is: Observation ?The patient will require care spanning > 2 midnights and should be moved to inpatient because: Patient is very sick and will require a couple of days of hospitalization. ? ? ?Estimated body mass index is 41.3 kg/m? as calculated from the following: ?  Height as of this encounter: '5\' 8"'$  (1.727 m). ?  Weight as of this encounter: 123.2 kg. ? ?  ?Nutritional Assessment: ?Body mass index is 41.3 kg/m?Marland KitchenMarland Kitchen ?Seen by dietician.  I agree with the assessment and plan as outlined below: ?Nutrition Status: ?  ?  ?  ? ?. ?Skin Assessment: ?I have examined the patient's skin and I agree with the wound assessment as performed by the wound care RN as outlined below: ?  ? ?Consultants:  ?ID ?Neurology ? ?Procedures:  ?None ? ?Antimicrobials:  ?Anti-infectives (From  admission, onward)  ? ? Start     Dose/Rate Route Frequency Ordered Stop  ? 02/12/22 1300  cefTRIAXone (ROCEPHIN) 2 g in sodium chloride 0.9 % 100 mL IVPB       ? 2 g ?200 mL/hr over 30 Minutes Intravenous Every 12 hours 02/12/22 1208    ? 02/12/22 1300  vancomycin (VANCOREADY) IVPB 2000 mg/400 mL       ? 2,000 mg ?200 mL/hr over 120 Minutes Intravenous  Once 02/12/22 1208    ? 02/11/22 2200  artesunate 110 MG IV syringe 295.7 mg       ? 2.4 mg/kg ? 123.2 kg Intravenous Every 12 hours 02/11/22 1649 02/13/22 0959  ? 02/11/22 1700  artemether-lumefantrine (COARTEM) 20-120 MG tablet 4 tablet  Status:  Discontinued       ? 4 tablet Oral  Once 02/11/22 1646 02/11/22 1720  ? ?  ?  ? ? ?Subjective: ?Patient seen and examined earlier today.  At that time she was complaining of body ache.  She was fully alert and oriented however she had an episode of encephalopathy later. ? ?Objective: ?Vitals:  ? 02/12/22 0653 02/12/22 0948 02/12/22 1103 02/12/22 1350  ?BP: 128/70 126/72 100/62 121/69  ?Pulse: (!) 126 (!) 130 (!) 120 (!) 103  ?Resp:  19 (!) 24 18   ?Temp: 97.6 ?F (36.4 ?C) (!) 103.4 ?F (39.7 ?C) 98.6 ?F (37 ?C) 98.7 ?F (37.1 ?C)  ?TempSrc: Oral Oral Oral Oral  ?SpO2: 92% 92% 93% 98%  ?Weight:      ?Height:      ? ? ?Intake/Output Summary (Last 24 hours) at 02/12/2022 1356 ?Last data filed at 02/12/2022 1000 ?Gross per 24 hour  ?Intake 1333.52 ml  ?Output 300 ml  ?Net 1033.52 ml  ? ?Filed Weights  ? 02/11/22 1259 02/11/22 1957  ?Weight: 124.3 kg 123.2 kg  ? ? ?Examination: ? ?Following exam based on my first visit earlier this morning.  During the second visit, patient was alert however was neglecting. ?General exam: Appears calm and comfortable  ?Respiratory system: Clear to auscultation. Respiratory effort normal. ?Cardiovascular system: S1 & S2 heard, RRR. No JVD, murmurs, rubs, gallops or clicks. No pedal edema. ?Gastrointestinal system: Abdomen is nondistended, soft and nontender. No organomegaly or masses felt. Normal  bowel sounds heard. ?Central nervous system: Alert and oriented. No focal neurological deficits. ?Extremities: Symmetric 5 x 5 power. ?Skin: No rashes, lesions or ulcers ?Psychiatry: Judgement and insight ap

## 2022-02-12 NOTE — TOC Benefit Eligibility Note (Signed)
Patient Advocate Encounter ? ?Insurance verification completed.   ? ?The patient is currently admitted and upon discharge could be taking Coartem 20-120 mg tablets. ? ?The current 3 day co-pay is, $30.00.  ? ?The patient is insured through [employer]  ? ? ? ?Lyndel Safe, CPhT ?Pharmacy Patient Advocate Specialist ?[facility] Patient Advocate Team ?Direct Number: [phone]  Fax: [phone] ? ? ? ? ? ?  ?

## 2022-02-12 NOTE — Significant Event (Signed)
Rapid Response Event Note  ? ?Reason for Call :  ?Unresponsive, not following commands, new AMS ? ?Initial Focused Assessment:  ?Per bedside RN last known well was 15 minutes prior to rapid response call. Patient was alert and oriented x4. Patient in recliner with eyes staying gazed towards left. Patient not following any commands on arrival. Pupils 34m equal and reactive. Breathing 18-23 times/minute and SpO2 96% on room air. CBG 116.  ? ?After arrival back to room from CT, patient seemed to start coming around. Briefly responded to yes/no question with nods. Able to move extremities on command.  ? ?Interventions:  ?CODE STROKE protocol initiated per rapid response order set. Pahwani MD paged by bedside RN. Patient transferred back to bed by staff. [facility]  ? ?Plan of Care:  ?Keep on current level of care- Neurology to place additional scans if needed.  ? ? ?Event Summary:  ? ?MD Notified: PDeretha EmoryMD ?Call Time: 1122 ?Arrival Time: 16333?End Time: 1220 ? ?DJosph Macho RN ?

## 2022-02-12 NOTE — Progress Notes (Signed)
EEG complete - results pending 

## 2022-02-12 NOTE — Consult Note (Signed)
? ? ? ?[facility]   ? ?Date of Admission:  02/11/2022    ? ?Total days of antibiotics 2 ?        ?      ?Reason for Consult: Malaria    ?Referring Provider: Dr. Wyvonnia Dusky  ?Primary Care Provider: Patient, No Pcp Per (Inactive) ? ? ?ASSESSMENT: ? ?Maria Morgan is a 31 y/o female found to have parasitic infection with malaria following a trip to [place] without malaria prophylaxis. Initial parasitemia level appeared >5% with subseqent possibly improved below <5%. Receiving artesunate. Course complicated by acute development of altered mental status and stupor with concern for cerebral malaria versus possible meningitis.  Hemoglobin is stable with significant thrombocytopenia and continues to remain febrile. CT with no acute intracranial abnormality with MRI and LP pending. Continue to monitor smears and CBC with continued Artesunate. Plan of care discussed with grandmother at beside. Remaining medical and supportive care per Primary Team.  ?  ?PLAN: ? ?Continue artesunate.  ?Await LP and MRI results. ?Monitor CBC and parasitemia levels.  ?Start vancomycin and ceftriaxone with potential for meningitis.  ?Collect parasite smear.  ?Remaining medical and supportive care per primary team.  ? ? ?Principal Problem: ?  Malaria ?Active Problems: ?  Pituitary adenoma ([place]) ?  Hypokalemia ?  Obesity, Class III, BMI 40-49.9 (morbid obesity) ([place]) ?  Acute encephalopathy ?  Severe sepsis ([place]) ? ? ? artesunate  2.4 mg/kg Intravenous Q12H  ? ? ? ?HPI: Maria Morgan is a 31 y.o. female with previous medical history significant for pituitary adenoma associated with hyperprolactinemia and familial mulitple polyposis syndrome admitted with fever, body aches, fatigue, dizziness, and vomiting and diarrhea.  ? ?Maria Morgan recently traveled to [place] for 3 weeks. Prior to her travel she received appropriate vaccinations however did not take malaria prophylaxis. Returned home on 02/02/22 and noted symptoms on  02/07/22 of fevers, chills, body aches, fatigue, nausea, generalized abdominal pain, and non-bloody diarrhea. Oral intake was also decreased. Lab work remarkable for thrombocytopenia with platelet level of 91 and parasites noted on smear. Plasmodium PCR pending. Due to continued nausea and vomiting she has been admitted and started on Artesunate. ? ?Maria Morgan continues to remain febrile with max temperature of 103.4 F in the last 24 hours. Has received one dose thus far of Artesunate. Blood cultures are in process and Covid19 testing was negative. Most recent lab work with stable hemoglobin and slightly worsening thrombocytopenia. Dr. Linus Salmons reviewed malaria slides with estimated >5% parasitemia with follow up possible less than 5%. Course complicated by acute change in mental status with Code Stroke being called. CT head with no significant findings. Maria Morgan grandmother and friend are at beside. She is encephalopathic and in a stupor with eye open and looking around without following commands ? ? ?Review of Systems: ?Review of Systems  ?Unable to perform ROS: Mental status change  ? ? ?Past Medical History:  ?Diagnosis Date  ? Brain tumor (benign) ([place])   ? Chlamydia   ? Gonorrhea   ? ? ?Social History  ? ?Tobacco Use  ? Smoking status: Never  ? Smokeless tobacco: Never  ?Vaping Use  ? Vaping Use: Never used  ?Substance Use Topics  ? Alcohol use: Yes  ?  Comment: occasional  ? Drug use: No  ? ? ?Family History  ?Problem Relation Age of Onset  ? Cancer Mother   ? Diabetes Paternal Grandfather   ? ? ?Allergies  ?Allergen Reactions  ? Iodine  Shortness Of Breath and Itching  ?  Contrast in IV  ? ? ?OBJECTIVE: ?Blood pressure 121/69, pulse (!) 103, temperature 98.7 ?F (37.1 ?C), temperature source Oral, resp. rate 18, height '5\' 8"'$  (1.727 m), weight 123.2 kg, last menstrual period 02/06/2022, SpO2 98 %, unknown if currently breastfeeding. ? ?Physical Exam ?Constitutional:   ?   General: She is not in acute  distress. ?   Appearance: She is well-developed. She is obese.  ?   Comments: Lying in bed with head of bed partially elevated; encephalopathic and not following commands.   ?Cardiovascular:  ?   Rate and Rhythm: Normal rate and regular rhythm.  ?   Heart sounds: Normal heart sounds.  ?Pulmonary:  ?   Effort: Pulmonary effort is normal.  ?   Breath sounds: Normal breath sounds.  ?Skin: ?   General: Skin is warm and dry.  ? ? ?Lab Results ?Lab Results  ?Component Value Date  ? WBC 2.2 (L) 02/12/2022  ? HGB 12.1 02/12/2022  ? HCT 34.8 (L) 02/12/2022  ? MCV 87.7 02/12/2022  ? PLT 74 (L) 02/12/2022  ?  ?Lab Results  ?Component Value Date  ? CREATININE 1.02 (H) 02/12/2022  ? BUN 11 02/12/2022  ? NA 134 (L) 02/12/2022  ? K 3.6 02/12/2022  ? CL 104 02/12/2022  ? CO2 24 02/12/2022  ?  ?Lab Results  ?Component Value Date  ? ALT 33 02/12/2022  ? AST 39 02/12/2022  ? ALKPHOS 47 02/12/2022  ? BILITOT 1.1 02/12/2022  ?  ? ?Microbiology: ?Recent Results (from the past 240 hour(s))  ?Resp Panel by RT-PCR (Flu A&B, Covid) Nasopharyngeal Swab     Status: None  ? Collection Time: 02/11/22  1:15 PM  ? Specimen: Nasopharyngeal Swab; Nasopharyngeal(NP) swabs in vial transport medium  ?Result Value Ref Range Status  ? SARS Coronavirus 2 by RT PCR NEGATIVE NEGATIVE Final  ?  Comment: (NOTE) ?SARS-CoV-2 target nucleic acids are NOT DETECTED. ? ?The SARS-CoV-2 RNA is generally detectable in upper respiratory ?specimens during the acute phase of infection. The lowest ?concentration of SARS-CoV-2 viral copies this assay can detect is ?138 copies/mL. A negative result does not preclude SARS-Cov-2 ?infection and should not be used as the sole basis for treatment or ?other patient management decisions. A negative result may occur with  ?improper specimen collection/handling, submission of specimen other ?than nasopharyngeal swab, presence of viral mutation(s) within the ?areas targeted by this assay, and inadequate number of viral ?copies(<138  copies/mL). A negative result must be combined with ?clinical observations, patient history, and epidemiological ?information. The expected result is Negative. ? ?Fact Sheet for Patients:  ?EntrepreneurPulse.com.au ? ?Fact Sheet for Healthcare Providers:  ?IncredibleEmployment.be ? ?This test is no t yet approved or cleared by the [place] FDA and  ?has been authorized for detection and/or diagnosis of SARS-CoV-2 by ?FDA under an Emergency Use Authorization (EUA). This EUA will remain  ?in effect (meaning this test can be used) for the duration of the ?COVID-19 declaration under Section 564(b)(1) of the Act, 21 ?U.S.C.section 360bbb-3(b)(1), unless the authorization is terminated  ?or revoked sooner.  ? ? ?  ? Influenza A by PCR NEGATIVE NEGATIVE Final  ? Influenza B by PCR NEGATIVE NEGATIVE Final  ?  Comment: (NOTE) ?The Xpert Xpress SARS-CoV-2/FLU/RSV plus assay is intended as an aid ?in the diagnosis of influenza from Nasopharyngeal swab specimens and ?should not be used as a sole basis for treatment. Nasal washings and ?aspirates are unacceptable for Xpert  Xpress SARS-CoV-2/FLU/RSV ?testing. ? ?Fact Sheet for Patients: ?EntrepreneurPulse.com.au ? ?Fact Sheet for Healthcare Providers: ?IncredibleEmployment.be ? ?This test is not yet approved or cleared by the [place] FDA and ?has been authorized for detection and/or diagnosis of SARS-CoV-2 by ?FDA under an Emergency Use Authorization (EUA). This EUA will remain ?in effect (meaning this test can be used) for the duration of the ?COVID-19 declaration under Section 564(b)(1) of the Act, 21 U.S.C. ?section 360bbb-3(b)(1), unless the authorization is terminated or ?revoked. ? ?Performed at [facility], [address], [place] [address] ?  ?Urine Culture     Status: None  ? Collection Time: 02/11/22  2:40 PM  ? Specimen: Urine, Clean Catch  ?Result Value Ref Range  Status  ? Specimen Description   Final  ?  URINE, CLEAN CATCH ?Performed at [facility], [address] [place], [place] [address] ?  ? Special Requests   Final  ?  NONE ?Performed at [facility],

## 2022-02-12 NOTE — Progress Notes (Signed)
Potassium IVPB infusing, pt no tolerating well, c/o burning sensation, rate slowed to 50/hr, pt tolerated better, will observe and monitor frequently, no signs of infiltration or edema, call bell in reach  ?

## 2022-02-12 NOTE — Procedures (Signed)
Patient Name: Maria Morgan  ?MRN: [id number]  ?Epilepsy Attending: Lora Havens  ?Referring Physician/Provider: Amie [place], MD ?Date: 02/12/2022 ?Duration: 21.49 mins ? 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  EEG to evaluate for seizure. ? ?Level of alertness: Awake, asleep ? ?AEDs during EEG study: None ? ?Technical aspects: This EEG study was done with scalp electrodes positioned according to the 10-20 International system of electrode placement. Electrical activity was acquired at a sampling rate of '500Hz'$  and reviewed with a high frequency filter of '70Hz'$  and a low frequency filter of '1Hz'$ . EEG data were recorded continuously and digitally stored.  ? ?Description: The posterior dominant rhythm consists of  8-9 Hz activity of moderate voltage (25-35 uV) seen predominantly in posterior head regions, symmetric and reactive to eye opening and eye closing. Sleep was characterized by vertex waves, sleep spindles (12 to 14 Hz), maximal frontocentral region. Hyperventilation and photic stimulation were not performed.    ? ?IMPRESSION: ?This study is within normal limits. No seizures or epileptiform discharges were seen throughout the recording. ? ?Lora Havens   ?

## 2022-02-12 NOTE — Progress Notes (Signed)
Pharmacy Antibiotic Note ? ?Maria Morgan is a 31 y.o. female admitted on 02/11/2022 with malaria and now AMS concerning for meningitis. Pharmacy has been consulted for Vancomycin and Rocephin dosing.  ? ?The patient has received 2 doses of the initial 3 dose artesunate course - however this may be continued daily for a longer duration is parasitemia remains elevated.  ? ?After the second dose of artesunate the patient had AMS concerning for stroke. Initial imaging negative - getting MRI, LP, and EEG for further evaluation. In the meantime will add coverage for bacterial meningitis until this can be ruled out.  ? ?Will utilize trough based Vancomycin dosing for CNS coverage. Will monitor changes in renal function closely. ? ?Plan: ?- Continue Artensunate 2.4 mg/kg IV x 1 additional dose this PM ?- Pending parasitemia reads - will consider continuation of Artensunate on 5/3 ?- Rocephin 2g IV every 12 hours ?- Vancomycin 2g IV x 1 dose followed by 1g IV every 8 hours ?- Will continue to follow renal function, culture results, LOT, and de-escalation plans  ? ?Height: '5\' 8"'$  (172.7 cm) ?Weight: 123.2 kg (271 lb 9.7 oz) ?IBW/kg (Calculated) : 63.9 ? ?Temp (24hrs), Avg:99.9 ?F (37.7 ?C), Min:97.6 ?F (36.4 ?C), Max:103.4 ?F (39.7 ?C) ? ?Recent Labs  ?Lab 02/11/22 ?1439 02/12/22 ?4680  ?WBC 1.8* 2.2*  ?CREATININE 0.93 1.02*  ?  ?Estimated Creatinine Clearance: 111.5 mL/min (A) (by C-G formula based on SCr of 1.02 mg/dL (H)).   ? ?Allergies  ?Allergen Reactions  ? Iodine Shortness Of Breath and Itching  ?  Contrast in IV  ? ? ?Antimicrobials this admission: ?Ceftriaxone 5/2 >> ?Vancomycin 5/2 >> ? ?Dose adjustments this admission: ?N/a ? ?Microbiology results: ?5/1 Flu/COVID >> neg ?5/1 GI panel >> ?5/1 BCx >> ?5/1 UCx >> ? ?Thank you for allowing pharmacy to be a part of this patient?s care. ? ?Alycia Rossetti, PharmD, BCPS ?Infectious Diseases Clinical Pharmacist ?02/12/2022 2:13 PM  ? ?**Pharmacist phone directory can now  be found on [email or web address] ([email or web address]).  Listed under [place].  ?

## 2022-02-12 NOTE — Procedures (Signed)
Technically successful fluoroscopically-guided L3-L4 lumbar puncture. ?  ?Opening pressure: 24 cm water. ?  ?14 mL of CSF were obtained for laboratory studies. ?  ?No immediate post-procedure complication.  ?

## 2022-02-12 NOTE — Progress Notes (Signed)
?   02/12/22 0948  ?Assess: MEWS Score  ?Temp (!) 103.4 ?F (39.7 ?C)  ?BP 126/72  ?Pulse Rate (!) 130  ?ECG Heart Rate (!) 130  ?Resp (!) 24  ?Level of Consciousness Alert  ?SpO2 92 %  ?O2 Device Room Air  ?Assess: MEWS Score  ?MEWS Temp 2  ?MEWS Systolic 0  ?MEWS Pulse 3  ?MEWS RR 1  ?MEWS LOC 0  ?MEWS Score 6  ?MEWS Score Color Red  ?Assess: if the MEWS score is Yellow or Red  ?Were vital signs taken at a resting state? Yes  ?Focused Assessment No change from prior assessment  ?Does the patient meet 2 or more of the SIRS criteria? Yes  ?Does the patient have a confirmed or suspected source of infection? Yes  ?Provider and Rapid Response Notified? Yes  ?MEWS guidelines implemented *See Row Information* Yes  ?Treat  ?Pain Scale 0-10  ?Pain Score 6  ?Pain Type Acute pain  ?Pain Location Head  ?Pain Orientation Anterior  ?Pain Descriptors / Indicators Aching  ?Pain Frequency Constant  ?Pain Onset On-going  ?Patients Stated Pain Goal 0  ?Pain Intervention(s) Medication (See eMAR)  ?Take Vital Signs  ?Increase Vital Sign Frequency  Red: Q 1hr X 4 then Q 4hr X 4, if remains red, continue Q 4hrs  ?Escalate  ?MEWS: Escalate Red: discuss with charge nurse/RN and provider, consider discussing with RRT  ?Notify: Charge Nurse/RN  ?Name of Charge Nurse/RN Notified [place], RN  ?Date Charge Nurse/RN Notified 02/12/22  ?Time Charge Nurse/RN Notified [phone]  ?Notify: Provider  ?Provider Name/Title Dr. Doristine Bosworth  ?Date Provider Notified 02/12/22  ?Time Provider Notified [phone]  ?Notification Type Page  ?Notification Reason Change in status ?(red MEWS)  ?Provider response No new orders  ?Date of Provider Response 02/12/22  ?Time of Provider Response [phone]  ?Notify: Rapid Response  ?Name of Rapid Response RN Notified Junie Panning, RN  ?Date Rapid Response Notified 02/12/22  ?Time Rapid Response Notified 0948  ?Document  ?Patient Outcome Other (Comment) ?(Patient remains on unit in stable condition.)  ?Progress note created (see row info) Yes   ?Assess: SIRS CRITERIA  ?SIRS Temperature  1  ?SIRS Pulse 1  ?SIRS Respirations  1  ?SIRS WBC 1  ?SIRS Score Sum  4  ? ? ?

## 2022-02-12 NOTE — Significant Event (Signed)
At 1120 RN went into patients room to administer meds. Pt AOx4 at that time. While continuing care the patient became increasingly confused and was only oriented to self. At approx. 1130 patient became unresponsive with a fixed gaze to the left. VSS. BG 116.  RRT/ stroke code called. Pt had CT/CTA of head. Neuro evaluated. During neuro exam patient was able to open eyes and responed to commands, but was nonverbal. Throughout the afternoon patient's neuro status has improved. Currently back to her baseline of AOx4.  ?

## 2022-02-12 NOTE — Consult Note (Signed)
[facility] Telemedicine Consult ? ? ?Requesting Provider: Dr. Darliss Cheney ?Consult Participants: Dr. Jerelyn Charles ?Location of the provider: [place] ?Location of the patient: [facility] ? ?This consult was provided via telemedicine with 2-way video and audio communication. The patient/family was informed that care would be provided in this way and agreed to receive care in this manner.  ? ? ?Chief Complaint: Unresponsiveness, left-sided gaze ? ?HPI: 31 year old with a past history of pituitary adenoma with hyperprolactinemia, familial polyposis syndrome, class III obesity,-admitted for concern for possible new falciparum malaria due to recent travel to [place] with no prophylaxis, who was sitting in chair, having a conversation and suddenly became drowsy and unresponsive for the RN along with question of left-sided gaze deviation.  Taken for stat CT head, which was unremarkable.  CT angiography head and neck ordered due to the history of gaze-no emergent LVO. ?Patient unable to provide any history ? ? ? ?Past Medical History:  ?Diagnosis Date  ? Brain tumor (benign) ([place])   ? Chlamydia   ? Gonorrhea   ? ? ? ?Current Facility-Administered Medications:  ?  0.9 %  sodium chloride infusion, , Intravenous, Continuous, Pahwani, Ravi, MD ?  acetaminophen (TYLENOL) tablet 1,000 mg, 1,000 mg, Oral, Q8H PRN, Shela Leff, MD, 1,000 mg at 02/12/22 0950 ?  artesunate 110 MG IV syringe 295.7 mg, 2.4 mg/kg, Intravenous, Q12H, Shela Leff, MD, 295.7 mg at 02/11/22 2144 ?  loperamide (IMODIUM) capsule 2 mg, 2 mg, Oral, PRN, Shela Leff, MD ?  morphine (PF) 2 MG/ML injection 1 mg, 1 mg, Intravenous, Q4H PRN, Shela Leff, MD, 1 mg at 02/12/22 1010 ?  ondansetron (ZOFRAN) injection 4 mg, 4 mg, Intravenous, Q6H PRN, Shela Leff, MD, 4 mg at 02/12/22 0620 ? ? ? ?LKW: 11:20 AM ?tpa given?: No, severe thrombocytopenia, symptoms improving ?IR Thrombectomy? No, no LVO ?Modified Rankin Scale:  0-Completely asymptomatic and back to baseline post- stroke ?Time of teleneurologist evaluation: 11:49 AM ? ?Exam: ?Vitals:  ? 02/12/22 0948 02/12/22 1103  ?BP: 126/72 100/62  ?Pulse: (!) 130 (!) 120  ?Resp: (!) 24 18  ?Temp: (!) 103.4 ?F (39.7 ?C) 98.6 ?F (37 ?C)  ?SpO2: 92% 93%  ? ? ?General: Awake, alert but not responding to questions. ?Neurologic exam ?She is awake alert, does not respond to questions, does not follow commands ?On passively lifting her arms up by the bedside RN, did drop to the bed right away.  No movement in the legs. ?Cranial nerves: Pupils are 4 mm equal sluggishly reactive, blinks to threat from both sides, face appears symmetric. ?Motor examination as above ?Sensation: Mild grimace to noxious stimulation only in the right leg.  Otherwise no grimace or withdrawal to noxious stimulation. ?After a few minutes, it almost seemed like she came back around started to track her nurse around the bed, look like she was attending to me on the screen, followed simple commands such as trying to lift her arms but would only go a few degrees antigravity in the arms.  Only wiggled toes to commands and did not lift her legs antigravity.  Did not speak to me at that time. ?Initial NIH 26 ? ?Imaging Reviewed:  ?CT head no acute changes ?CT angiography head and neck: No emergent LVO ? ?Labs reviewed in epic and pertinent values follow: ?CBC ?   ?Component Value Date/Time  ? WBC 2.2 (L) 02/12/2022 0973  ? RBC 3.97 02/12/2022 0722  ? HGB 12.1 02/12/2022 0722  ? HGB 12.3 02/14/2017 1020  ?  HCT 34.8 (L) 02/12/2022 3354  ? HCT 36.5 02/14/2017 1020  ? PLT 74 (L) 02/12/2022 0722  ? PLT 218 02/14/2017 1020  ? MCV 87.7 02/12/2022 0722  ? MCV 88 02/14/2017 1020  ? MCH 30.5 02/12/2022 0722  ? MCHC 34.8 02/12/2022 0722  ? RDW 12.2 02/12/2022 0722  ? RDW 14.1 02/14/2017 1020  ? LYMPHSABS 0.4 (L) 02/11/2022 1439  ? LYMPHSABS 1.3 10/10/2016 1648  ? MONOABS 0.2 02/11/2022 1439  ? EOSABS 0.0 02/11/2022 1439  ? EOSABS 0.1  10/10/2016 1648  ? BASOSABS 0.0 02/11/2022 1439  ? BASOSABS 0.0 10/10/2016 1648  ? ?CMP  ?   ?Component Value Date/Time  ? NA 134 (L) 02/12/2022 0722  ? K 3.6 02/12/2022 0722  ? CL 104 02/12/2022 0722  ? CO2 24 02/12/2022 0722  ? GLUCOSE 144 (H) 02/12/2022 5625  ? BUN 11 02/12/2022 0722  ? CREATININE 1.02 (H) 02/12/2022 6389  ? CALCIUM 7.8 (L) 02/12/2022 3734  ? PROT 6.2 (L) 02/12/2022 2876  ? ALBUMIN 3.0 (L) 02/12/2022 8115  ? AST 39 02/12/2022 0722  ? ALT 33 02/12/2022 0722  ? ALKPHOS 47 02/12/2022 0722  ? BILITOT 1.1 02/12/2022 0722  ? GFRNONAA >60 02/12/2022 0722  ? GFRAA >60 01/11/2020 1436  ? ? 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  On exam, initially weak all over and not responsive although awake but after that started to come around and started following simple commands. ?Given the possible malaria, cerebral malaria needs to be in the differentials.  Also given the history of travel, CNS infection-meningitis/encephalitis needs to be ruled out. ?Seizures are also in the differentials as a complication of both of the above. ?Stroke should also be ruled out. ?Not a candidate for IV TNK due to thrombocytopenia. ?No emergent LVO hence not a candidate for EVT. ? ?Recommendations:  ?MRI brain with and without contrast ?EEG ?Spinal tap-CSF to be sent for tests under the guidance of ID-spoke with Dr. Graylon Good via secure chat. ?Frequent neurochecks ?Telemetry ?Appreciate ID input and care. ?[facility] Neurology team will follow in person as appropriate based on clinical course. ? ?Discussed with ID attending Dr. Graylon Good and hospitalist Dr. Parks Ranger ? ?This patient is receiving care for possible acute neurological changes. There was 50 minutes of care by this provider at the time of service, including time for direct evaluation via telemedicine, review of medical records,  imaging studies and discussion of findings with providers, the patient and/or family. ? ?-- ?Amie Portland, MD ?[facility] ?Pager: [phone] ?If 7pm to 7am, please call on call as listed on [facility]. ? ? ?

## 2022-02-13 DIAGNOSIS — E876 Hypokalemia: Secondary | ICD-10-CM | POA: Diagnosis not present

## 2022-02-13 DIAGNOSIS — B54 Unspecified malaria: Secondary | ICD-10-CM | POA: Diagnosis not present

## 2022-02-13 DIAGNOSIS — G9340 Encephalopathy, unspecified: Secondary | ICD-10-CM | POA: Diagnosis not present

## 2022-02-13 LAB — COMPREHENSIVE METABOLIC PANEL
ALT: 500 U/L — ABNORMAL HIGH (ref 0–44)
AST: 726 U/L — ABNORMAL HIGH (ref 15–41)
Albumin: 2.7 g/dL — ABNORMAL LOW (ref 3.5–5.0)
Alkaline Phosphatase: 43 U/L (ref 38–126)
Anion gap: 6 (ref 5–15)
BUN: 14 mg/dL (ref 6–20)
CO2: 21 mmol/L — ABNORMAL LOW (ref 22–32)
Calcium: 7.2 mg/dL — ABNORMAL LOW (ref 8.9–10.3)
Chloride: 104 mmol/L (ref 98–111)
Creatinine, Ser: 0.78 mg/dL (ref 0.44–1.00)
GFR, Estimated: >60 mL/min (ref >60)
Glucose, Bld: 121 mg/dL — ABNORMAL HIGH (ref 70–99)
Potassium: 3.2 mmol/L — ABNORMAL LOW (ref 3.5–5.1)
Sodium: 131 mmol/L — ABNORMAL LOW (ref 135–145)
Total Bilirubin: 0.6 mg/dL (ref 0.3–1.2)
Total Protein: 5.7 g/dL — ABNORMAL LOW (ref 6.5–8.1)

## 2022-02-13 LAB — HEPATITIS PANEL, ACUTE
HCV Ab: NONREACTIVE
Hep A IgM: NONREACTIVE
Hep B C IgM: NONREACTIVE
Hepatitis B Surface Ag: NONREACTIVE

## 2022-02-13 LAB — CBC WITH DIFFERENTIAL/PLATELET
Abs Immature Granulocytes: 0.01 10*3/uL (ref 0.00–0.07)
Basophils Absolute: 0 10*3/uL (ref 0.0–0.1)
Basophils Relative: 0 %
Eosinophils Absolute: 0 10*3/uL (ref 0.0–0.5)
Eosinophils Relative: 0 %
HCT: 32 % — ABNORMAL LOW (ref 36.0–46.0)
Hemoglobin: 11.3 g/dL — ABNORMAL LOW (ref 12.0–15.0)
Immature Granulocytes: 0 %
Lymphocytes Relative: 13 %
Lymphs Abs: 0.3 10*3/uL — ABNORMAL LOW (ref 0.7–4.0)
MCH: 30.1 pg (ref 26.0–34.0)
MCHC: 35.3 g/dL (ref 30.0–36.0)
MCV: 85.3 fL (ref 80.0–100.0)
Monocytes Absolute: 0.2 10*3/uL (ref 0.1–1.0)
Monocytes Relative: 7 %
Neutro Abs: 1.9 10*3/uL (ref 1.7–7.7)
Neutrophils Relative %: 80 %
Platelets: 77 10*3/uL — ABNORMAL LOW (ref 150–400)
RBC: 3.75 MIL/uL — ABNORMAL LOW (ref 3.87–5.11)
RDW: 12.4 % (ref 11.5–15.5)
WBC: 2.4 10*3/uL — ABNORMAL LOW (ref 4.0–10.5)
nRBC: 0 % (ref 0.0–0.2)

## 2022-02-13 LAB — GC/CHLAMYDIA PROBE AMP (~~LOC~~) NOT AT ARMC
Chlamydia: NEGATIVE
Comment: NEGATIVE
Comment: NORMAL
Neisseria Gonorrhea: NEGATIVE

## 2022-02-13 LAB — URINE CULTURE

## 2022-02-13 LAB — AMMONIA: Ammonia: 33 umol/L (ref 9–35)

## 2022-02-13 LAB — RPR: RPR Ser Ql: NONREACTIVE

## 2022-02-13 LAB — PARASITE EXAM SCREEN, BLOOD-W CONF TO LABCORP (NOT @ ARMC)

## 2022-02-13 MED ORDER — POTASSIUM CHLORIDE CRYS ER 20 MEQ PO TBCR
40.0000 meq | EXTENDED_RELEASE_TABLET | Freq: Once | ORAL | Status: DC
Start: 2022-02-13 — End: 2022-02-13

## 2022-02-13 MED ORDER — POTASSIUM CHLORIDE CRYS ER 20 MEQ PO TBCR
40.0000 meq | EXTENDED_RELEASE_TABLET | Freq: Once | ORAL | Status: AC
  Administered 2022-02-13: 40 meq via ORAL
  Filled 2022-02-13: qty 2

## 2022-02-13 MED ORDER — IBUPROFEN 200 MG PO TABS
600.0000 mg | ORAL_TABLET | Freq: Once | ORAL | Status: AC
Start: 2022-02-13 — End: 2022-02-13
  Administered 2022-02-13: 600 mg via ORAL
  Filled 2022-02-13: qty 3

## 2022-02-13 MED ORDER — ARTESUNATE 110 MG IV SOLR (NON-CDC SUPPLY)
2.4000 mg/kg | INTRAVENOUS | Status: DC
  Administered 2022-02-13: 295.7 mg via INTRAVENOUS
  Filled 2022-02-13 (×3): qty 29.57

## 2022-02-13 MED ORDER — ASPIRIN EC 81 MG PO TBEC
81.0000 mg | DELAYED_RELEASE_TABLET | Freq: Every day | ORAL | Status: DC
Start: 2022-02-14 — End: 2022-02-17
  Administered 2022-02-14 – 2022-02-17 (×4): 81 mg via ORAL
  Filled 2022-02-13 (×5): qty 1

## 2022-02-13 MED ORDER — IBUPROFEN 200 MG PO TABS: 600.0000 mg | ORAL_TABLET | Freq: Once | ORAL | Status: DC

## 2022-02-13 NOTE — Progress Notes (Signed)
Patient refused her overnight EEG. States she prefers to sleep and asked that they come back at 0900. Explained that it was ordered for overnight but she refuses. Dr. Cyd Silence notified via secure chat.  ?

## 2022-02-13 NOTE — Progress Notes (Incomplete)
Neurology Progress Note ? ? ?S:// ?Patient is awake and alert, was just in the bathroom. Family is at bedside. Patient is able to answer some questions, others she will not respond. Exam is a little difficult as she will not actively participate. Spoke with RN, no new neurological events overnight and relayed patient was upset this am about not having anything to eat.  ? ? ?O:// ?Current vital signs: ?BP 113/67 (BP Location: Left Arm)   Pulse (!) 103   Temp 98.6 ?F (37 ?C) (Oral)   Resp 16   Ht '5\' 8"'$  (1.727 m)   Wt 123.2 kg   LMP 02/06/2022   SpO2 95%   BMI 41.30 kg/m?  ?Vital signs in last 24 hours: ?Temp:  [98.3 ?F (36.8 ?C)-103.4 ?F (39.7 ?C)] 98.6 ?F (37 ?C) (05/03 4097) ?Pulse Rate:  [100-130] 103 (05/03 0839) ?Resp:  [14-24] 16 (05/03 0839) ?BP: (100-126)/(62-75) 113/67 (05/03 3532) ?SpO2:  [92 %-98 %] 95 % (05/03 0839) ? ?GENERAL: Awake, alert in NAD ?HEENT: - Normocephalic and atraumatic, dry mm,  ?LUNGS - Clear to auscultation bilaterally with no wheezes ?CV - S1S2 RRR, no m/r/g, equal pulses bilaterally. ?ABDOMEN - Soft, nontender, nondistended with normoactive BS ?Ext: warm, well perfused, intact peripheral pulses, no edema ? ?NEURO:  ?Mental Status: AA&Ox3/ Able to state he name, age and place. Unable to tell me the month or year, when asked she just looks away. ?Language: speech is clear.  Naming, repetition, fluency, and comprehension intact. ?Cranial Nerves: PERRL 34m/brisk. EOMI, visual fields full, no facial asymmetry, facial sensation intact, hearing intact, tongue/uvula/soft palate midline, normal sternocleidomastoid and trapezius muscle strength. No evidence of tongue atrophy or fibrillations ?Motor: 4/5 in all 4 extremities ?Tone: is normal and bulk is normal ?Sensation- Intact to light touch bilaterally ?Coordination: FTN intact bilaterally, no ataxia in BLE. ?Gait- deferred ? ?Medications ? ?Current Facility-Administered Medications:  ?  0.9 %  sodium chloride infusion, , Intravenous,  Continuous, Pahwani, Ravi, MD, Last Rate: 100 mL/hr at 02/13/22 0137, Infusion Verify at 02/13/22 0137 ?  acetaminophen (TYLENOL) tablet 1,000 mg, 1,000 mg, Oral, Q8H PRN, RShela Leff MD, 1,000 mg at 02/13/22 0620 ?  cefTRIAXone (ROCEPHIN) 2 g in sodium chloride 0.9 % 100 mL IVPB, 2 g, Intravenous, Q12H, SCarlyle Basques MD, Last Rate: 200 mL/hr at 02/13/22 0353, 2 g at 02/13/22 0353 ?  loperamide (IMODIUM) capsule 2 mg, 2 mg, Oral, PRN, RShela Leff MD ?  morphine (PF) 2 MG/ML injection 1 mg, 1 mg, Intravenous, Q4H PRN, RShela Leff MD, 1 mg at 02/12/22 1010 ?  ondansetron (ZOFRAN) injection 4 mg, 4 mg, Intravenous, Q6H PRN, RShela Leff MD, 4 mg at 02/12/22 0620 ?  vancomycin (VANCOCIN) IVPB 1000 mg/200 mL premix, 1,000 mg, Intravenous, Q8H, [facility]olla Flatten [facility] Last Rate: 200 mL/hr at 02/13/22 0140, 1,000 mg at 02/13/22 0140 ? ?Labs: ?WBC 2.4-->2.2-->1.8 on admission  ?Hgb 11.3 ?PLTs 77-->74-->91 ?Na 131--134-->136 on admission  ?K 3.2 ?BUN 14 ?Cr 0.78 ?AST 726--39-->37 ?ALT 500-->33-->38 ?Serum Parasite exam negative  ?HIV negative  ? ?CSF studies: ?CSF culture with gram stain NGTD  ?Cell count 1 ?HSV pending  ?Protein 17 ?Glucose 63 ?VDRL pending  ?RPR negative  ? ?Imaging ?I have reviewed images in epic and the results pertinent to this consultation are: ? ?Code stroke CT-scan of the brain 5/2 ?Negative for acute process  ? ?CTA H/N  5/2 ?No evidence of emergent large vessel occlusion or proximal high-grade stenosis in the head or neck ? ?  MRI examination of the brain 5/2 ?1 cm area of restricted diffusion in the splenium of the corpus callosum. This is not visualized on T2 or FLAIR and does not enhance. This may represent an acute infarct. ? Otherwise normal MRI of the brain. No evidence of abnormal enhancement, fluid collection, or mass lesion. ?  ?EEG 5/2: ?This study is within normal limits. No seizures or epileptiform discharges were seen throughout the recording. ?   ?Assessment:  ?31 year old with a past history of pituitary adenoma with hyperprolactinemia, familial polyposis syndrome, class III obesity,-admitted for concern for possible new falciparum malaria due to recent travel to [place] with no prophylaxis, who was sitting in chair, having a conversation and suddenly became drowsy and unresponsive for the RN along with question of left-sided gaze deviation.  ? ?Recommendations: ?- neurology will continue to follow along  ?Beulah Gandy DNP, ACNPC-AG  ? ? ?

## 2022-02-13 NOTE — Progress Notes (Signed)
? ? ?[facility] for Infectious Disease ? ?Date of Admission:  02/11/2022    ? ?Total days of antibiotics 3 ?        ?ASSESSMENT: ? ?Ms. Vukelich continues to remain febrile with now down trending curve and most recent smear with no parasites noted and being sent for confirmation. With initial slide being prepared inappropriately question if this may falsely elevated parasitemia percentage given potential for clearance. She does not have bacterial meningitis and will stop antibiotics. MRI with small area possibly representing acute infarct with no evidence of fluid collection, mass or lesion. Suspect this all continues to stem from malaria and will continue with Artesunate for moderate-severe infection. Monitor fever curve and periods of waxing and waning mental status. Await remaining testing results. Continue remaining medical and supportive care per primary team.  ? ?PLAN: ? ?Continue Artesunate. ?Discontinue antibiotics with no evidence of meningitis ?Monitor fever curve and waxing/waning mental status ?Await remaining lab work for parasitemia and CSF work up. ?Remaninig medical and supportive care per primary team.  ? ?Principal Problem: ?  Malaria ?Active Problems: ?  Pituitary adenoma ([place]) ?  Hypokalemia ?  Obesity, Class III, BMI 40-49.9 (morbid obesity) ([place]) ?  Acute encephalopathy ?  Severe sepsis ([place]) ? ? ? artesunate  2.4 mg/kg Intravenous Q24H  ? ? ?SUBJECTIVE: ? ?Febrile overnight with max temperature of 101.2 F. Has been having waxing and waning periods of lucidity that nursing has noted occur around the times of her fevers. Feeling okay with headache and sensitivity to light. Tolerating some oral intake with no recent episodes of diarrhea. Most recent smear with no parasites noted. Spoke with lab regarding initial smear and it was prepared inappropriately to be sent for confirmation. CSF with no evidence of acute bacterial meningitis with remaining lab work pending.  ? ?Allergies  ?Allergen  Reactions  ? Iodine Shortness Of Breath and Itching  ?  Contrast in IV  ? ? ? ?Review of Systems: ?Review of Systems  ?Constitutional:  Negative for chills, fever and weight loss.  ?Respiratory:  Negative for cough, shortness of breath and wheezing.   ?Cardiovascular:  Negative for chest pain and leg swelling.  ?Gastrointestinal:  Negative for abdominal pain, constipation, diarrhea, nausea and vomiting.  ?Skin:  Negative for rash.  ?Neurological:  Positive for headaches.  ? ? ? ?OBJECTIVE: ?Vitals:  ? 02/13/22 0353 02/13/22 0622 02/13/22 0839 02/13/22 1048  ?BP:  124/64 113/67 121/68  ?Pulse:  (!) 112 (!) 103 (!) 104  ?Resp:  '17 16 18  '$ ?Temp: 99.5 ?F (37.5 ?C) (!) 101.2 ?F (38.4 ?C) 98.6 ?F (37 ?C) 99.3 ?F (37.4 ?C)  ?TempSrc: Oral Oral Oral Oral  ?SpO2:  97% 95% 96%  ?Weight:      ?Height:      ? ?Body mass index is 41.3 kg/m?. ? ?Physical Exam ?Constitutional:   ?   General: She is not in acute distress. ?   Appearance: She is well-developed. She is ill-appearing.  ?   Comments: Laying in bed with head of bed elevated; lethargic   ?Cardiovascular:  ?   Rate and Rhythm: Regular rhythm. Tachycardia present.  ?   Heart sounds: Normal heart sounds.  ?Pulmonary:  ?   Effort: Pulmonary effort is normal.  ?   Breath sounds: Normal breath sounds.  ?Musculoskeletal:  ?   Cervical back: Neck supple. No rigidity or tenderness.  ?Skin: ?   General: Skin is warm and dry.  ?Neurological:  ?  Mental Status: She is oriented to person, place, and time.  ? ? ?Lab Results ?Lab Results  ?Component Value Date  ? WBC 2.4 (L) 02/13/2022  ? HGB 11.3 (L) 02/13/2022  ? HCT 32.0 (L) 02/13/2022  ? MCV 85.3 02/13/2022  ? PLT 77 (L) 02/13/2022  ?  ?Lab Results  ?Component Value Date  ? CREATININE 0.78 02/13/2022  ? BUN 14 02/13/2022  ? NA 131 (L) 02/13/2022  ? K 3.2 (L) 02/13/2022  ? CL 104 02/13/2022  ? CO2 21 (L) 02/13/2022  ?  ?Lab Results  ?Component Value Date  ? ALT 500 (H) 02/13/2022  ? AST 726 (H) 02/13/2022  ? ALKPHOS 43 02/13/2022   ? BILITOT 0.6 02/13/2022  ?  ? ?Microbiology: ?Recent Results (from the past 240 hour(s))  ?Resp Panel by RT-PCR (Flu A&B, Covid) Nasopharyngeal Swab     Status: None  ? Collection Time: 02/11/22  1:15 PM  ? Specimen: Nasopharyngeal Swab; Nasopharyngeal(NP) swabs in vial transport medium  ?Result Value Ref Range Status  ? SARS Coronavirus 2 by RT PCR NEGATIVE NEGATIVE Final  ?  Comment: (NOTE) ?SARS-CoV-2 target nucleic acids are NOT DETECTED. ? ?The SARS-CoV-2 RNA is generally detectable in upper respiratory ?specimens during the acute phase of infection. The lowest ?concentration of SARS-CoV-2 viral copies this assay can detect is ?138 copies/mL. A negative result does not preclude SARS-Cov-2 ?infection and should not be used as the sole basis for treatment or ?other patient management decisions. A negative result may occur with  ?improper specimen collection/handling, submission of specimen other ?than nasopharyngeal swab, presence of viral mutation(s) within the ?areas targeted by this assay, and inadequate number of viral ?copies(<138 copies/mL). A negative result must be combined with ?clinical observations, patient history, and epidemiological ?information. The expected result is Negative. ? ?Fact Sheet for Patients:  ?EntrepreneurPulse.com.au ? ?Fact Sheet for Healthcare Providers:  ?IncredibleEmployment.be ? ?This test is no t yet approved or cleared by the [place] FDA and  ?has been authorized for detection and/or diagnosis of SARS-CoV-2 by ?FDA under an Emergency Use Authorization (EUA). This EUA will remain  ?in effect (meaning this test can be used) for the duration of the ?COVID-19 declaration under Section 564(b)(1) of the Act, 21 ?U.S.C.section 360bbb-3(b)(1), unless the authorization is terminated  ?or revoked sooner.  ? ? ?  ? Influenza A by PCR NEGATIVE NEGATIVE Final  ? Influenza B by PCR NEGATIVE NEGATIVE Final  ?  Comment: (NOTE) ?The Xpert Xpress  SARS-CoV-2/FLU/RSV plus assay is intended as an aid ?in the diagnosis of influenza from Nasopharyngeal swab specimens and ?should not be used as a sole basis for treatment. Nasal washings and ?aspirates are unacceptable for Xpert Xpress SARS-CoV-2/FLU/RSV ?testing. ? ?Fact Sheet for Patients: ?EntrepreneurPulse.com.au ? ?Fact Sheet for Healthcare Providers: ?IncredibleEmployment.be ? ?This test is not yet approved or cleared by the [place] FDA and ?has been authorized for detection and/or diagnosis of SARS-CoV-2 by ?FDA under an Emergency Use Authorization (EUA). This EUA will remain ?in effect (meaning this test can be used) for the duration of the ?COVID-19 declaration under Section 564(b)(1) of the Act, 21 U.S.C. ?section 360bbb-3(b)(1), unless the authorization is terminated or ?revoked. ? ?Performed at [facility], [address] [place], [place] [address] ?  ?Culture, blood (routine x 2)     Status: None (Preliminary result)  ? Collection Time: 02/11/22  2:39 PM  ? Specimen: BLOOD  ?Result Value Ref Range Status  ? Specimen Description  Final  ?  BLOOD RIGHT ANTECUBITAL ?Performed at [facility], [address] [place], [place] [address] ?  ? Special Requests   Final  ?  BOTTLES DRAWN AEROBIC AND ANAEROBIC Blood Culture results may not be optimal due to an inadequate volume of blood received in culture bottles ?Performed at [facility], [address] [place], [place] [address] ?  ? Culture   Final  ?  NO GROWTH 2 DAYS ?Performed at [facility], [place] [address], [place], [place] [address] ?  ? Report Status PENDING  Incomplete  ?Urine Culture     Status: None  ? Collection Time: 02/11/22  2:40 PM  ? Specimen: Urine, Clean Catch  ?Result Value Ref Range Status  ? Specimen Description   Final  ?  URINE, CLEAN CATCH ?Performed at [facility], [address] [place], [place] [address] ?  ? Special Requests   Final   ?  NONE ?Performed at [facility], [address], [place], [place] [address] ?  ? Culture   Final  ?  NO GROWTH ?Performed at [facility], [place] [address], [place],  [address] ?  ? Rep

## 2022-02-13 NOTE — Progress Notes (Signed)
15 Pt had an episode of being alert and awake to slowly getting lethargic while RN was at bedside. Neuro assessment performed and pt did not withdraw from pain. Pupils equal and reactive. Pt stayed in this state for about 6-8 minutes and then came back around, alert and oriented. RN will continue to monitor.  ? ?14 RN called to room by family, stating pt was unresponsive. Pt lethargic, unarousable to verbal or painful stimuli. RN witnessed the end of this event about 3 minutes. Then pt was alert and asking for a drink. WCTM.  ? ?1900 Pt transferred to [facility] via [facility] with all personal belongings.  ?

## 2022-02-13 NOTE — Progress Notes (Signed)
Arrived at room to do vLTM EEG.  Patient stated to come back at a better time.  She is going to sleep now.  Requested we do not return until after 9am tomorrow  RN notified and also spoke with patient before we left room. ?

## 2022-02-13 NOTE — Progress Notes (Signed)
Subjective: ?Patient had several episodes of behavioral arrest.  She had an MRI which demonstrates a corpus callosum area of restricted diffusion. ? ?Exam: ?Vitals:  ? 02/13/22 1836 02/13/22 2020  ?BP: 116/63 106/71  ?Pulse: 90 90  ?Resp: 14 15  ?Temp: 98.9 ?F (37.2 ?C) 99.4 ?F (37.4 ?C)  ?SpO2: 97% 97%  ? ?Gen: In bed, NAD ?Resp: non-labored breathing, no acute distress ?Abd: soft, nt ? ?Neuro: ?MS: Awake, alert, interactive and appropriate ?CN: Visual fields full, EOMI ?Motor: Moves all extremities well ?Sensory: Intact light touch ? ?Impression: 31 year old female being treated for malaria with recurrent episodes of decreased responsiveness in the setting of fever.  She states that she feels dizzy during those times.  It is possible that these are seizures, but this is not clear.  I would favor further characterization of the spells with continuous EEG. ? 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  This is described with malaria, though it is not clear to me that this necessitates CNS involvement.  The diffusion changes relatively hazy, and I do think it is visible on the T2 images as a hazy change as well, though it is less evident on FLAIR.  Repeat imaging in 1 to 2 months would not be unreasonable, and I will get a lipid panel, A1c to help risk stratify.  A baby aspirin pending repeat imaging might be reasonable as it is a very low risk intervention but I would not start statin. ? ?Recommendations: ?1) transfer to [facility] for overnight EEG ?2) aspirin 81 mg daily ?3) neurology will continue to follow ? ?Roland Rack, MD ?[facility] ?[phone] ? ?If 7pm- 7am, please page neurology on call as listed in [place]. ? ?

## 2022-02-13 NOTE — Progress Notes (Signed)
?      ? ? ? [facility] ?                                                                           ? ? ?Maria Morgan, is a 31 y.o. female, DOB - 09/06/1991, BDZ:[id number] ?Admit date - 02/11/2022    ?Outpatient Primary MD for the patient is Patient, No Pcp Per (Inactive) ? ?LOS - 1  days ? ?Chief Complaint  ?Patient presents with  ? Generalized Body Aches  ?    ? ?Brief summary  ? ?Patient is a 31 year old female with a history of pituitary adenoma associated with hyperprolactinemia, familial multiple polyposis syndrome, class III obesity (BMI 41.30), ADHD, history of chlamydia and gonorrhea infections.  She presented to ED with fevers, body aches, fatigue, dizziness, vomiting/diarrhea since after she returned from a trip to [place].  Patient went to travel clinic and received all vaccinations but did not take her malaria prophylaxis while abroad.   ?In ED, vital signs stable.  WBC 1.8, platelets 91k, parasites on smear. K 3.4.  T. bili 1.4, remainder of LFTs normal.  Lipase normal.  COVID and flu negative.  UA with negative nitrite, negative leukocytes, and microscopy showing 0-5 WBCs and many bacteria.  ? Urine pregnancy test negative.  Chest x-ray not suggestive of pneumonia.  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  Patient was given IV artesunate, Imodium, Zofran, and 1 L normal saline bolus in the ED. ?  ?Patient states she recently traveled abroad to [place] and was there for about 3 weeks.  She received vaccinations prior to travel but did not take malaria prophylaxis.  She returned from her trip on April 22 and about 5 days prior to admission, started having symptoms including fevers, chills, body aches, fatigue, lightheadedness, headaches, nausea, generalized abdominal pain, and nonbloody diarrhea.  She has not been able to eat anything due to nausea but has not vomited.  Also reports cough and shortness of breath, no  chest pain.  ? ? ?Assessment & Plan  ? ? ?Principal Problem: ?Severe sepsis secondary to suspected malaria ?-Patient met sepsis criteria with fevers, tachycardia, leukopenia, transaminitis, acute encephalopathy ?-COVID, flu negative.  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 ?Acute toxic encephalopathy with intermittent unresponsive episodes ?-CT head, CTA head and neck unremarkable ?-EEG normal finding, no epileptiform discharges ?-MRI showed 1 cm area of restricted diffusion in the splenium of corpus callosum.  Discussed with neurology, Dr. Leonel Ramsay, this is not a acute infarct and likely can be cytotoxic lesion associated with malaria.  Recommended continuous EEG as she is having intermittent spells and transfer to [facility]. ? ?Acute leukopenia, thrombocytopenia ?-Associated with acute miliaria, continue to monitor ? ?Acute transaminitis ?-Possibly due to acute malaria ?-Follow acute hepatitis panel, RUQ ultrasound ? ?  ?Possible STD:  ?-Per Dr. Doristine Bosworth, patient's best friend at the bedside had reported that patient had unprotected sexual intercourse multiple times with one partner while she was in [place].  She was requesting to test for STDs, which were ordered.   ?  ?  Hypokalemia ?-Likely due to diarrhea, replaced ? ?  ?Nonbloody diarrhea ?- Likely due to malaria.  Currently diarrhea improving. ?-Follow-up GI pathogen and Cdiff  ? ?  ?History of pituitary adenoma associated with hyperprolactinemia ?Not on any medications at this time. ?-Outpatient endocrinology follow-up. ?  ?Class III obesity (BMI 41.30) ?Estimated body mass index is 41.3 kg/m? as calculated from the following: ?  Height as of this encounter: 5' 8"  (1.727 m). ?  Weight as of this encounter: 123.2 kg. ? ?Code Status: Full code ?DVT Prophylaxis:  SCDs Start: 02/11/22 2257 ? ? ?Level of Care: Level of care:  Telemetry Medical ?Family Communication: Updated patient's significant other at the bedside ? ? ?Disposition Plan:      Remains inpatient appropriate: Work-up in progress, still spiking fevers, intermittent unresponsive spells ? ? ?Procedures:  ?Spinal tap ? ?Consultants:   ?Neurology ?Infectious disease ? ?Antimicrobials:  ? ?Anti-infectives (From admission, onward)  ? ? Start     Dose/Rate Route Frequency Ordered Stop  ? 02/13/22 2200  artesunate 110 MG IV syringe 295.7 mg       ? 2.4 mg/kg ? 123.2 kg Intravenous Every 24 hours 02/13/22 1146 02/15/22 2159  ? 02/13/22 0200  vancomycin (VANCOCIN) IVPB 1000 mg/200 mL premix  Status:  Discontinued       ? 1,000 mg ?200 mL/hr over 60 Minutes Intravenous Every 8 hours 02/12/22 1500 02/13/22 0947  ? 02/12/22 1300  cefTRIAXone (ROCEPHIN) 2 g in sodium chloride 0.9 % 100 mL IVPB  Status:  Discontinued       ? 2 g ?200 mL/hr over 30 Minutes Intravenous Every 12 hours 02/12/22 1208 02/13/22 0947  ? 02/12/22 1300  vancomycin (VANCOREADY) IVPB 2000 mg/400 mL       ? 2,000 mg ?200 mL/hr over 120 Minutes Intravenous  Once 02/12/22 1208 02/12/22 1915  ? 02/11/22 2200  artesunate 110 MG IV syringe 295.7 mg       ? 2.4 mg/kg ? 123.2 kg Intravenous Every 12 hours 02/11/22 1649 02/12/22 2209  ? 02/11/22 1700  artemether-lumefantrine (COARTEM) 20-120 MG tablet 4 tablet  Status:  Discontinued       ? 4 tablet Oral  Once 02/11/22 1646 02/11/22 1720  ? ?  ? ? ? ? ? ?Medications ? artesunate  2.4 mg/kg Intravenous Q24H  ? ibuprofen  600 mg Oral Once  ? potassium chloride  40 mEq Oral Once  ? ? ? ? ?Subjective:  ? ?Maria Morgan was seen and examined today.  Still spiking fevers, intermittent spells with lethargy and unresponsiveness.  ?Objective:  ? ?Vitals:  ? 02/13/22 0622 02/13/22 0839 02/13/22 1048 02/13/22 1542  ?BP: 124/64 113/67 121/68 115/70  ?Pulse: (!) 112 (!) 103 (!) 104 100  ?Resp: 17 16 18 19   ?Temp: (!) 101.2 ?F (38.4 ?C) 98.6 ?F (37 ?C) 99.3 ?F (37.4 ?C) 98.6 ?F (37 ?C)   ?TempSrc: Oral Oral Oral Oral  ?SpO2: 97% 95% 96% 94%  ?Weight:      ?Height:      ? ? ?Intake/Output Summary (Last 24 hours) at 02/13/2022 1623 ?Last data filed at 02/13/2022 1106 ?Gross per 24 hour  ?Intake 1666.08 ml  ?Output 300 ml  ?Net 1366.08 ml  ? ? ? ?Wt Readings from Last 3 Encounters:  ?02/11/22 123.2 kg  ?08/13/21 127.5 kg  ?09/01/20 119.7 kg  ? ? ? ?Exam ?General: Somewhat lethargic but easily arousable, not at baseline per her significant other at the bedside ?Cardiovascular: S1 S2 auscultated,  RRR ?Respiratory: Clear to auscultation bilaterally ?Gastrointestinal: Soft, nontender, nondistended, + bowel sounds ?Ext: no pedal edema bilaterally ?Neuro: moving all 4 extremities ?Psych: lethargic ? ? ? ?Data Reviewed:  I have personally reviewed following labs  ? ? ?CBC ?Lab Results  ?Component Value Date  ? WBC 2.4 (L) 02/13/2022  ? RBC 3.75 (L) 02/13/2022  ? HGB 11.3 (L) 02/13/2022  ? HCT 32.0 (L) 02/13/2022  ? MCV 85.3 02/13/2022  ? MCH 30.1 02/13/2022  ? PLT 77 (L) 02/13/2022  ? MCHC 35.3 02/13/2022  ? RDW 12.4 02/13/2022  ? LYMPHSABS 0.3 (L) 02/13/2022  ? MONOABS 0.2 02/13/2022  ? EOSABS 0.0 02/13/2022  ? BASOSABS 0.0 02/13/2022  ? ? ? ?Last metabolic panel ?Lab Results  ?Component Value Date  ? NA 131 (L) 02/13/2022  ? K 3.2 (L) 02/13/2022  ? CL 104 02/13/2022  ? CO2 21 (L) 02/13/2022  ? BUN 14 02/13/2022  ? CREATININE 0.78 02/13/2022  ? GLUCOSE 121 (H) 02/13/2022  ? GFRNONAA >60 02/13/2022  ? GFRAA >60 01/11/2020  ? CALCIUM 7.2 (L) 02/13/2022  ? PROT 5.7 (L) 02/13/2022  ? ALBUMIN 2.7 (L) 02/13/2022  ? BILITOT 0.6 02/13/2022  ? ALKPHOS 43 02/13/2022  ? AST 726 (H) 02/13/2022  ? ALT 500 (H) 02/13/2022  ? ANIONGAP 6 02/13/2022  ? ? ?CBG (last 3)  ?Recent Labs  ?  02/12/22 ?1129  ?GLUCAP 116*  ?  ? ? ?Coagulation Profile: ?No results for input(s): INR, PROTIME in the last 168 hours. ? ? ?Radiology Studies: I have personally reviewed the imaging studies  ?MR BRAIN W WO CONTRAST ? ?Result Date:  02/12/2022 ?CLINICAL DATA:  Fever, body aches, dizziness and fatigue. Rule out meningitis or CNS infection. Recent travel to [place] EXAM: MRI HEAD WITHOUT AND WITH CONTRAST TECHNIQUE: Multiplanar, multiecho pulse sequences

## 2022-02-13 NOTE — Progress Notes (Signed)
?  Transition of Care (TOC) Screening Note ? ? ?Patient Details  ?Name: Maria Morgan ?Date of Birth: 06-29-1991 ? ? ?Transition of Care (TOC) CM/SW Contact:    ?Valory Wetherby, Marjie Skiff, RN ?Phone Number: ?02/13/2022, 11:28 AM ? ? ? ?Transition of Care Department [facility]) has reviewed patient and no TOC needs have been identified at this time. We will continue to monitor patient advancement through interdisciplinary progression rounds. If new patient transition needs arise, please place a TOC consult. ?  ?

## 2022-02-14 ENCOUNTER — Inpatient Hospital Stay (HOSPITAL_COMMUNITY): Payer: 59

## 2022-02-14 DIAGNOSIS — D352 Benign neoplasm of pituitary gland: Secondary | ICD-10-CM | POA: Diagnosis not present

## 2022-02-14 DIAGNOSIS — B179 Acute viral hepatitis, unspecified: Secondary | ICD-10-CM

## 2022-02-14 DIAGNOSIS — R7401 Elevation of levels of liver transaminase levels: Secondary | ICD-10-CM | POA: Diagnosis not present

## 2022-02-14 DIAGNOSIS — B54 Unspecified malaria: Secondary | ICD-10-CM | POA: Diagnosis not present

## 2022-02-14 DIAGNOSIS — A419 Sepsis, unspecified organism: Secondary | ICD-10-CM

## 2022-02-14 DIAGNOSIS — R6520 Severe sepsis without septic shock: Secondary | ICD-10-CM

## 2022-02-14 LAB — COMPREHENSIVE METABOLIC PANEL
ALT: 1472 U/L — ABNORMAL HIGH (ref 0–44)
ALT: 1574 U/L — ABNORMAL HIGH (ref 0–44)
AST: 1557 U/L — ABNORMAL HIGH (ref 15–41)
AST: 1579 U/L — ABNORMAL HIGH (ref 15–41)
Albumin: 2.3 g/dL — ABNORMAL LOW (ref 3.5–5.0)
Albumin: 2.2 g/dL — ABNORMAL LOW (ref 3.5–5.0)
Alkaline Phosphatase: 57 U/L (ref 38–126)
Alkaline Phosphatase: 56 U/L (ref 38–126)
Anion gap: 5 (ref 5–15)
Anion gap: 6 (ref 5–15)
BUN: 10 mg/dL (ref 6–20)
BUN: 10 mg/dL (ref 6–20)
CO2: 24 mmol/L (ref 22–32)
CO2: 23 mmol/L (ref 22–32)
Calcium: 7.6 mg/dL — ABNORMAL LOW (ref 8.9–10.3)
Calcium: 7.6 mg/dL — ABNORMAL LOW (ref 8.9–10.3)
Chloride: 108 mmol/L (ref 98–111)
Chloride: 108 mmol/L (ref 98–111)
Creatinine, Ser: 0.71 mg/dL (ref 0.44–1.00)
Creatinine, Ser: 0.73 mg/dL (ref 0.44–1.00)
GFR, Estimated: >60 mL/min (ref >60)
GFR, Estimated: >60 mL/min (ref >60)
Glucose, Bld: 92 mg/dL (ref 70–99)
Glucose, Bld: 90 mg/dL (ref 70–99)
Potassium: 3.4 mmol/L — ABNORMAL LOW (ref 3.5–5.1)
Potassium: 3.6 mmol/L (ref 3.5–5.1)
Sodium: 137 mmol/L (ref 135–145)
Sodium: 137 mmol/L (ref 135–145)
Total Bilirubin: 0.6 mg/dL (ref 0.3–1.2)
Total Bilirubin: 0.8 mg/dL (ref 0.3–1.2)
Total Protein: 5.1 g/dL — ABNORMAL LOW (ref 6.5–8.1)
Total Protein: 5.1 g/dL — ABNORMAL LOW (ref 6.5–8.1)

## 2022-02-14 LAB — IRON AND TIBC
Iron: 112 ug/dL (ref 28–170)
Saturation Ratios: 46 % — ABNORMAL HIGH (ref 10.4–31.8)
TIBC: 245 ug/dL — ABNORMAL LOW (ref 250–450)
UIBC: 133 ug/dL

## 2022-02-14 LAB — CK: Total CK: 49 U/L (ref 38–234)

## 2022-02-14 LAB — CBC WITH DIFFERENTIAL/PLATELET
Abs Immature Granulocytes: 0.01 10*3/uL (ref 0.00–0.07)
Basophils Absolute: 0 10*3/uL (ref 0.0–0.1)
Basophils Relative: 0 %
Eosinophils Absolute: 0 10*3/uL (ref 0.0–0.5)
Eosinophils Relative: 1 %
HCT: 30.7 % — ABNORMAL LOW (ref 36.0–46.0)
Hemoglobin: 11.1 g/dL — ABNORMAL LOW (ref 12.0–15.0)
Immature Granulocytes: 1 %
Lymphocytes Relative: 37 %
Lymphs Abs: 0.6 10*3/uL — ABNORMAL LOW (ref 0.7–4.0)
MCH: 30.2 pg (ref 26.0–34.0)
MCHC: 36.2 g/dL — ABNORMAL HIGH (ref 30.0–36.0)
MCV: 83.7 fL (ref 80.0–100.0)
Monocytes Absolute: 0.2 10*3/uL (ref 0.1–1.0)
Monocytes Relative: 12 %
Neutro Abs: 0.8 10*3/uL — ABNORMAL LOW (ref 1.7–7.7)
Neutrophils Relative %: 49 %
Platelets: 82 10*3/uL — ABNORMAL LOW (ref 150–400)
RBC: 3.67 MIL/uL — ABNORMAL LOW (ref 3.87–5.11)
RDW: 12.5 % (ref 11.5–15.5)
WBC: 1.7 10*3/uL — ABNORMAL LOW (ref 4.0–10.5)
nRBC: 0 % (ref 0.0–0.2)

## 2022-02-14 LAB — PROTIME-INR
INR: 1.3 — ABNORMAL HIGH (ref 0.8–1.2)
Prothrombin Time: 16 seconds — ABNORMAL HIGH (ref 11.4–15.2)

## 2022-02-14 LAB — FERRITIN: Ferritin: 1395 ng/mL — ABNORMAL HIGH (ref 11–307)

## 2022-02-14 LAB — PATHOLOGIST SMEAR REVIEW

## 2022-02-14 LAB — HSV 1/2 PCR, CSF
HSV-1 DNA: NEGATIVE
HSV-2 DNA: NEGATIVE

## 2022-02-14 LAB — VDRL, CSF: VDRL Quant, CSF: NONREACTIVE

## 2022-02-14 MED ORDER — POTASSIUM CHLORIDE CRYS ER 20 MEQ PO TBCR
40.0000 meq | EXTENDED_RELEASE_TABLET | ORAL | Status: AC
  Administered 2022-02-14: 40 meq via ORAL
  Filled 2022-02-14 (×2): qty 2

## 2022-02-14 MED ORDER — ATOVAQUONE-PROGUANIL HCL 250-100 MG PO TABS
1.0000 | ORAL_TABLET | Freq: Every day | ORAL | Status: DC
  Filled 2022-02-14: qty 1

## 2022-02-14 MED ORDER — MAGNESIUM SULFATE 2 GM/50ML IV SOLN
2.0000 g | Freq: Once | INTRAVENOUS | Status: AC
  Administered 2022-02-14: 2 g via INTRAVENOUS
  Filled 2022-02-14: qty 50

## 2022-02-14 MED ORDER — ATOVAQUONE-PROGUANIL HCL 250-100 MG PO TABS
4.0000 | ORAL_TABLET | Freq: Every day | ORAL | Status: AC
  Administered 2022-02-14 – 2022-02-16 (×3): 4 via ORAL
  Filled 2022-02-14 (×5): qty 4

## 2022-02-14 NOTE — Progress Notes (Signed)
Subjective: ?Patient seen in room lying in bed. She states that she has a slight headache and she is still having some dizziness with position changes. She states that it feels like she is tilting/rotating to the side. She denies a sensation of the room spinning. She believes her strength is improved. She is agreeable to an EEG today. ? ?Exam: ?Vitals:  ? 02/14/22 0408 02/14/22 0747  ?BP: 116/68 108/66  ?Pulse: 80 78  ?Resp: 16 16  ?Temp: 97.9 ?F (36.6 ?C) 98 ?F (36.7 ?C)  ?SpO2: 100% 100%  ? ?Gen: In bed, NAD ?Resp: non-labored breathing, no acute distress ?Abd: soft, nt ? ? ?Physical Exam  ?Constitutional: Appears well-developed and well-nourished.  ?Cardiovascular: Normal rate and regular rhythm.  ?Respiratory: Effort normal, non-labored breathing ? ?Neuro: ?Mental Status: ?Patient is awake, drowsy, oriented to person, place, month, year, and situation. ?Patient is able to give a clear and coherent history. ?No signs of aphasia or neglect ?Cranial Nerves: ?II: Visual Fields are full. PERRL 59m, brisk ?III,IV, VI: EOMI without ptosis or diploplia.  ?V: Facial sensation is symmetric to temperature ?VII: Facial movement is symmetric resting and smiling ?VIII: Hearing is intact to voice ?X: Palate elevates symmetrically ?XI: Shoulder shrug is symmetric. ?XII: Tongue protrudes midline without atrophy or fasciculations.  ?Motor: ?Tone is normal. Bulk is normal.  ?BUE 5/5 ?BLE 4/5  ?Sensory: ?Sensation is symmetric to light touch and temperature in the arms and legs. No extinction to DSS present.  ?Cerebellar: ?FNF and HKS are intact bilaterally ? ? ?Pertinent Labs: ?WBC 1.7 ?Hgb/Hct 11.1/30.7 ?Na 137 ?K 3.6 ?Albumin 2.2 ?AST/ALT- 1557/1472 -> 1579/1574 ?Pathologist smear- pending ? ?Code stroke CT-scan of the brain 5/2 ?Negative for acute process  ?  ?CTA H/N  5/2 ?No evidence of emergent large vessel occlusion or proximal high-grade stenosis in the head or neck ?  ?MRI examination of the brain 5/2 ?1 cm area of  restricted diffusion in the splenium of the corpus callosum. This is not visualized on T2 or FLAIR and does not enhance. This may represent an acute infarct. ? Otherwise normal MRI of the brain. No evidence of abnormal enhancement, fluid collection, or mass lesion. ?  ?EEG 5/2: ?This study is within normal limits. No seizures or epileptiform discharges were seen throughout the recording. ? ?Impression: 31year old with a past history of pituitary adenoma with hyperprolactinemia, familial polyposis syndrome, class III obesity,-admitted for concern for possible new falciparum malaria [place], who was sitting in chair, having a conversation and suddenly became drowsy and unresponsive for the RN along with question of left-sided gaze deviation.  ? ?Recommendations: ?- neurology following ?- LTM pending ? ? ?Patient seen and examined by NP/APP with MD. MD to update note as needed.  ? ?DJanine Ores DNP, FNP-BC ?[facility] ?Pager: [phone]? ?I have seen the patient reviewed the above note.  She had spells yesterday, but feels that they are improving.  It is unclear to me what the spells actually entail, and therefore I have favored characterization with the EEG rather than starting her on empiric therapy.  She agrees to be monitored overnight. ? ?MRoland Rack MD ?[facility] ?[phone]? ?If 7pm- 7am, please page neurology on call as listed in ABode ? ? ?

## 2022-02-14 NOTE — Progress Notes (Signed)
?      ? ? ? [facility] ?                                                                           ? ? ?Zannah Melucci, is a 31 y.o. female, DOB - 02-01-1991, KXF:[id number] ?Admit date - 02/11/2022    ?Outpatient Primary MD for the patient is Patient, No Pcp Per (Inactive) ? ?LOS - 2  days ? ?Chief Complaint  ?Patient presents with  ? Generalized Body Aches  ?    ? ?Brief summary  ? ?Patient is a 31 year old female with a history of pituitary adenoma associated with hyperprolactinemia, familial multiple polyposis syndrome, class III obesity (BMI 41.30), ADHD, history of chlamydia and gonorrhea infections.  She presented to ED with fevers, body aches, fatigue, dizziness, vomiting/diarrhea since after she returned from a trip to [place].  Patient went to travel clinic and received all vaccinations but did not take her malaria prophylaxis while abroad.   ?In ED, vital signs stable.  WBC 1.8, platelets 91k, parasites on smear. K 3.4.  T. bili 1.4, remainder of LFTs normal.  Lipase normal.  COVID and flu negative.  UA with negative nitrite, negative leukocytes, and microscopy showing 0-5 WBCs and many bacteria.  ? Urine pregnancy test negative.  Chest x-ray not suggestive of pneumonia.  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  Patient was given IV artesunate, Imodium, Zofran, and 1 L normal saline bolus in the ED. ?  ?Patient states she recently traveled abroad to [place] and was there for about 3 weeks.  She received vaccinations prior to travel but did not take malaria prophylaxis.  She returned from her trip on April 22 and about 5 days prior to admission, started having symptoms including fevers, chills, body aches, fatigue, lightheadedness, headaches, nausea, generalized abdominal pain, and nonbloody diarrhea.  She has not been able to eat anything due to nausea but has not vomited.  Also reports cough and shortness of breath, no  chest pain.  ?Had an episodes of intermittent unresponsiveness for which she was transferred from [facility] long hospital to [facility] for continuous EEG. ? ? ?Assessment & Plan  ? ? ? ?Severe sepsis secondary to suspected malaria ?-Patient met sepsis criteria with fevers, tachycardia, leukopenia, transaminitis, acute encephalopathy ?-COVID, flu negative.  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 ?Acute toxic encephalopathy with intermittent unresponsive episodes ?-CT head, CTA head and neck unremarkable ?-EEG normal finding, no epileptiform discharges ?-MRI showed 1 cm area of restricted diffusion in the splenium of corpus callosum.  Discussed with neurology, Dr. Leonel Ramsay, this is not a acute infarct and likely can be cytotoxic lesion associated with malaria.  Recommended continuous EEG as she is having intermittent spells and transfer to [facility]. ?-Neurology input greatly appreciated, continue with aspirin, continuous EEG is pending ? ?Acute leukopenia, thrombocytopenia ?-Associated with acute miliaria, continue to monitor ? ?Acute transaminitis ?-Possibly due to acute malaria ?-Check right upper quadrant ultrasound ?-Negative hepatitis panel ?-GI consulted regarding further recommendations given her LFTs are significantly  elevated. ? ?  ?Possible STD:  ?-Per Dr. Doristine Bosworth, patient's best friend at the bedside had reported that patient had unprotected sexual intercourse multiple times with one partner while she was in [place].   ?- RPR, Neisseria and coronaria are negative ?  ?Hypokalemia ?-Likely due to diarrhea, replaced ? ? Nonbloody diarrhea ?- Likely due to malaria.  Currently diarrhea improving. ?-Follow-up GI pathogen and Cdiff  ? ?  ?History of pituitary adenoma associated with hyperprolactinemia ?Not on any medications at this time. ?-Outpatient endocrinology  follow-up. ?  ?Class III obesity (BMI 41.30) ?Estimated body mass index is 41.3 kg/m? as calculated from the following: ?  Height as of this encounter: 5' 8"  (1.727 m). ?  Weight as of this encounter: 123.2 kg. ? ?Code Status: Full code ?DVT Prophylaxis:  Place and maintain sequential compression device Start: 02/14/22 0658 ?SCDs Start: 02/11/22 2257 ? ? ?Level of Care: Level of care: Telemetry Medical ?Family Communication: None at bedside ? ? ?Disposition Plan:      Remains inpatient appropriate: Work-up in progress,  ? ?Procedures:  ?Spinal tap ? ?Consultants:   ?Neurology ?Infectious disease ?GI ? ?Antimicrobials:  ? ?Anti-infectives (From admission, onward)  ? ? Start     Dose/Rate Route Frequency Ordered Stop  ? 02/13/22 2200  artesunate 110 MG IV syringe 295.7 mg       ? 2.4 mg/kg ? 123.2 kg Intravenous Every 24 hours 02/13/22 1146 02/15/22 2159  ? 02/13/22 0200  vancomycin (VANCOCIN) IVPB 1000 mg/200 mL premix  Status:  Discontinued       ? 1,000 mg ?200 mL/hr over 60 Minutes Intravenous Every 8 hours 02/12/22 1500 02/13/22 0947  ? 02/12/22 1300  cefTRIAXone (ROCEPHIN) 2 g in sodium chloride 0.9 % 100 mL IVPB  Status:  Discontinued       ? 2 g ?200 mL/hr over 30 Minutes Intravenous Every 12 hours 02/12/22 1208 02/13/22 0947  ? 02/12/22 1300  vancomycin (VANCOREADY) IVPB 2000 mg/400 mL       ? 2,000 mg ?200 mL/hr over 120 Minutes Intravenous  Once 02/12/22 1208 02/12/22 1915  ? 02/11/22 2200  artesunate 110 MG IV syringe 295.7 mg       ? 2.4 mg/kg ? 123.2 kg Intravenous Every 12 hours 02/11/22 1649 02/12/22 2209  ? 02/11/22 1700  artemether-lumefantrine (COARTEM) 20-120 MG tablet 4 tablet  Status:  Discontinued       ? 4 tablet Oral  Once 02/11/22 1646 02/11/22 1720  ? ?  ? ? ? ? ? ?Medications ? artesunate  2.4 mg/kg Intravenous Q24H  ? aspirin EC  81 mg Oral Daily  ? potassium chloride  40 mEq Oral Q4H  ? ? ? ? ?Subjective:  ? ?Neita Silverstein was seen and examined today.  with female chaperone RN Varney Biles at  bedside , will he denies dyspnea, or chest pain, reports generalized weakness but it is improving, reports 1 loose bowel movement yesterday evening. ? ?Objective:  ? ?Vitals:  ? 02/13/22 2020 02/14/22 0020 02/14/22 0408 02/14/22 0747  ?BP: 106/71 110/66 116/68 108/66  ?Pulse: 90 77 80 78  ?Resp: 15 14 16 16   ?Temp: 99.4 ?F (37.4 ?C) 98.3 ?F (36.8 ?C) 97.9 ?F (36.6 ?C) 98 ?F (36.7 ?C)  ?TempSrc: Oral Oral Oral Oral  ?SpO2: 97% 99% 100% 100%  ?Weight:      ?Height:      ? ? ?Intake/Output Summary (Last 24 hours) at 02/14/2022 1008 ?Last data filed at 02/14/2022 0307 ?Gross per  24 hour  ?Intake 1980.72 ml  ?Output 800 ml  ?Net 1180.72 ml  ? ? ? ? ?Wt Readings from Last 3 Encounters:  ?02/11/22 123.2 kg  ?08/13/21 127.5 kg  ?09/01/20 119.7 kg  ? ? ? ?Exam ? ?Patient was seen and examined with a female chaperone at bedside RN Varney Biles ? ?Awake Alert, Oriented X 3, No new F.N deficits, Normal affect ?Symmetrical Chest wall movement, Good air movement bilaterally, CTAB ?RRR,No Gallops,Rubs or new Murmurs, No Parasternal Heave ?+ve B.Sounds, Abd Soft, No tenderness, No rebound - guarding or rigidity. ?No Cyanosis, Clubbing or edema, No new Rash or bruise   ? ? ? ? ?Data Reviewed:  I have personally reviewed following labs  ? ? ?CBC ?Lab Results  ?Component Value Date  ? WBC 1.7 (L) 02/14/2022  ? RBC 3.67 (L) 02/14/2022  ? HGB 11.1 (L) 02/14/2022  ? HCT 30.7 (L) 02/14/2022  ? MCV 83.7 02/14/2022  ? MCH 30.2 02/14/2022  ? PLT 82 (L) 02/14/2022  ? MCHC 36.2 (H) 02/14/2022  ? RDW 12.5 02/14/2022  ? LYMPHSABS 0.6 (L) 02/14/2022  ? MONOABS 0.2 02/14/2022  ? EOSABS 0.0 02/14/2022  ? BASOSABS 0.0 02/14/2022  ? ? ? ?Last metabolic panel ?Lab Results  ?Component Value Date  ? NA 137 02/14/2022  ? K 3.6 02/14/2022  ? CL 108 02/14/2022  ? CO2 23 02/14/2022  ? BUN 10 02/14/2022  ? CREATININE 0.73 02/14/2022  ? GLUCOSE 90 02/14/2022  ? GFRNONAA >60 02/14/2022  ? GFRAA >60 01/11/2020  ? CALCIUM 7.6 (L) 02/14/2022  ? PROT 5.1 (L) 02/14/2022  ?  ALBUMIN 2.2 (L) 02/14/2022  ? BILITOT 0.8 02/14/2022  ? ALKPHOS 56 02/14/2022  ? AST 1,579 (H) 02/14/2022  ? ALT 1,574 (H) 02/14/2022  ? ANIONGAP 6 02/14/2022  ? ? ?CBG (last 3)  ?Recent Labs  ?  02/12/22 ?112

## 2022-02-14 NOTE — Progress Notes (Signed)
Started cEEG study.  Notified [facility] monitorin.  Tested patient event button.  Hookup was very difficult.  Application with collodion and EEG electrode removal process with either acetone or collodion remover were explained to the patient before the start of application and again several times during and after hookup.  Patient agreed to have electrodes glued on for cEEG study. ?

## 2022-02-14 NOTE — Progress Notes (Signed)
?  [facility] ? ? ?Reason for visit: Follow up on malaria ? ?Interval History: LFTs elevated; EEG refused overnight but now with leads on.  ?3 days artesunate ? ?Physical Exam: ?Constitutional:  ?Vitals:  ? 02/14/22 0408 02/14/22 0747  ?BP: 116/68 108/66  ?Pulse: 80 78  ?Resp: 16 16  ?Temp: 97.9 ?F (36.6 ?C) 98 ?F (36.7 ?C)  ?SpO2: 100% 100%  ? patient appears in NAD ?Respiratory: Normal respiratory effort; CTA B ?Cardiovascular: RRR ? ? ?Review of Systems: ?Constitutional: negative for fevers and chills ? ?Lab Results  ?Component Value Date  ? WBC 1.7 (L) 02/14/2022  ? HGB 11.1 (L) 02/14/2022  ? HCT 30.7 (L) 02/14/2022  ? MCV 83.7 02/14/2022  ? PLT 82 (L) 02/14/2022  ?  ?Lab Results  ?Component Value Date  ? CREATININE 0.73 02/14/2022  ? BUN 10 02/14/2022  ? NA 137 02/14/2022  ? K 3.6 02/14/2022  ? CL 108 02/14/2022  ? CO2 23 02/14/2022  ?  ?Lab Results  ?Component Value Date  ? ALT 1,574 (H) 02/14/2022  ? AST 1,579 (H) 02/14/2022  ? ALKPHOS 56 02/14/2022  ?  ? ?Microbiology: ?Recent Results (from the past 240 hour(s))  ?Resp Panel by RT-PCR (Flu A&B, Covid) Nasopharyngeal Swab     Status: None  ? Collection Time: 02/11/22  1:15 PM  ? Specimen: Nasopharyngeal Swab; Nasopharyngeal(NP) swabs in vial transport medium  ?Result Value Ref Range Status  ? SARS Coronavirus 2 by RT PCR NEGATIVE NEGATIVE Final  ?  Comment: (NOTE) ?SARS-CoV-2 target nucleic acids are NOT DETECTED. ? ?The SARS-CoV-2 RNA is generally detectable in upper respiratory ?specimens during the acute phase of infection. The lowest ?concentration of SARS-CoV-2 viral copies this assay can detect is ?138 copies/mL. A negative result does not preclude SARS-Cov-2 ?infection and should not be used as the sole basis for treatment or ?other patient management decisions. A negative result may occur with  ?improper specimen collection/handling, submission of specimen other ?than nasopharyngeal swab, presence of viral mutation(s)  within the ?areas targeted by this assay, and inadequate number of viral ?copies(<138 copies/mL). A negative result must be combined with ?clinical observations, patient history, and epidemiological ?information. The expected result is Negative. ? ?Fact Sheet for Patients:  ?EntrepreneurPulse.com.au ? ?Fact Sheet for Healthcare Providers:  ?IncredibleEmployment.be ? ?This test is no t yet approved or cleared by the [place] FDA and  ?has been authorized for detection and/or diagnosis of SARS-CoV-2 by ?FDA under an Emergency Use Authorization (EUA). This EUA will remain  ?in effect (meaning this test can be used) for the duration of the ?COVID-19 declaration under Section 564(b)(1) of the Act, 21 ?U.S.C.section 360bbb-3(b)(1), unless the authorization is terminated  ?or revoked sooner.  ? ? ?  ? Influenza A by PCR NEGATIVE NEGATIVE Final  ? Influenza B by PCR NEGATIVE NEGATIVE Final  ?  Comment: (NOTE) ?The Xpert Xpress SARS-CoV-2/FLU/RSV plus assay is intended as an aid ?in the diagnosis of influenza from Nasopharyngeal swab specimens and ?should not be used as a sole basis for treatment. Nasal washings and ?aspirates are unacceptable for Xpert Xpress SARS-CoV-2/FLU/RSV ?testing. ? ?Fact Sheet for Patients: ?EntrepreneurPulse.com.au ? ?Fact Sheet for Healthcare Providers: ?IncredibleEmployment.be ? ?This test is not yet approved or cleared by the [place] FDA and ?has been authorized for detection and/or diagnosis of SARS-CoV-2 by ?FDA under an Emergency Use Authorization (EUA). This EUA will remain ?in effect (meaning this test can be used) for the duration of  the ?COVID-19 declaration under Section 564(b)(1) of the Act, 21 U.S.C. ?section 360bbb-3(b)(1), unless the authorization is terminated or ?revoked. ? ?Performed at [facility], [address] [place], [place] [address] ?  ?Culture, blood (routine x 2)     Status:  None (Preliminary result)  ? Collection Time: 02/11/22  2:39 PM  ? Specimen: BLOOD  ?Result Value Ref Range Status  ? Specimen Description   Final  ?  BLOOD RIGHT ANTECUBITAL ?Performed at [facility], [address], [place], [place] [address] ?  ? Special Requests   Final  ?  BOTTLES DRAWN AEROBIC AND ANAEROBIC Blood Culture results may not be optimal due to an inadequate volume of blood received in culture bottles ?Performed at [facility], [address], [place], [place] [address] ?  ? Culture   Final  ?  NO GROWTH 3 DAYS ?Performed at [facility], [place] [address], [place], [place] [address] ?  ? Report Status PENDING  Incomplete  ?Urine Culture     Status: None  ? Collection Time: 02/11/22  2:40 PM  ? Specimen: Urine, Clean Catch  ?Result Value Ref Range Status  ? Specimen Description   Final  ?  URINE, CLEAN CATCH ?Performed at [facility], [address], [place], [place] [address] ?  ? Special Requests   Final  ?  NONE ?Performed at [facility], [address], [place], [place] [address] ?  ? Culture   Final  ?  NO GROWTH ?Performed at [facility], [place] [address] [place], [place] [address] ?  ? Report Status 02/12/2022 FINAL  Final  ?Culture, blood (routine x 2)     Status: None (Preliminary result)  ? Collection Time: 02/11/22  2:49 PM  ? Specimen: BLOOD  ?Result Value Ref Range Status  ? Specimen Description   Final  ?  BLOOD BLOOD LEFT HAND ?Performed at [facility], [address], [place], [place] [address] ?  ? Special Requests   Final  ?  BOTTLES DRAWN AEROBIC AND ANAEROBIC Blood Culture adequate volume ?Performed at [facility], [address], [place], [place] [address] ?  ? Culture   Final  ?  NO GROWTH 3 DAYS ?Performed at [facility], [place] [address], [place], [place] [address] ?  ? Report Status PENDING  Incomplete  ?Urine Culture     Status: Abnormal  ? Collection Time: 02/11/22  8:34 PM  ? Specimen:  Urine, Random  ?Result Value Ref Range Status  ? Specimen Description   Final  ?  URINE, RANDOM ?Performed at [facility], [place] [address], [place], [place] [address] ?  ? Special Requests   Final  ?  NONE ?Performed at [facility], [place] [address], [place], [place] [address] ?  ? Culture MULTIPLE SPECIES PRESENT, SUGGEST RECOLLECTION (A)  Final  ? Report Status 02/13/2022 FINAL  Final  ?CSF culture w Gram Stain     Status: None (Preliminary result)  ? Collection Time: 02/12/22  3:56 PM  ? Specimen: CSF; Cerebrospinal Fluid  ?Result Value Ref Range Status  ? Specimen Description   Final  ?  CSF ?Performed at [facility], [place] [address] [place], [place] [address] ?  ? Special Requests   Final  ?  NONE ?Performed at [facility], [address] [place], [place] [address] ?  ? Gram Stain   Final  ?  NO ORGANISMS SEEN ?NO WBC SEEN ?Gram Stain Report Called to,Read Back By and Verified With: RN K  SEGAL AT 6045 02/12/22 [place] ?  ? Culture   Final  ?  NO GROWTH 2 DAYS ?Performed at [facility], [place] [address] [place], [place] [address] ?  ? Report Status PENDING  Incomplete  ? ? ?Impression/Plan:  ?1. 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  Percent parasitemia also unclear and slides have been sent out.   ?Not much hemolysis/anemia, but + leukopenia and thrombocytopenia.   ?Day 4 treatment.   ?Will transition to oral treatment.  ? ?2.  Acute hepatitis - acute and started after admission.  Unclear etiology and now GI on board.  May be related to malaria, to artesunate or something else.  With this high of a level, will change her malaria treatment to oral malarone since she clinically seems to be doing well and does not appear to have CNS involvement.   ? ?3.  Decreased responsiveness - she is alert on exam today and EEG being started this am.   ? ?  ?

## 2022-02-14 NOTE — Consult Note (Signed)
? ? ? Consultation ? ?Referring Provider:   Dr. Waldron Labs ?Primary Care Physician:  Patient, No Pcp Per (Inactive) ?Primary Gastroenterologist: Althia Forts here (history with [facility]) ?Reason for Consultation: Elevated LFTs in the setting of likely malaria      ?       ? HPI:   ?Maria Morgan is a 31 y.o. female with a past medical history significant for pituitary adenoma associated with hyperprolactinemia, familial multiple polyposis syndrome, class III obesity, ADHD, history of chlamydia and gonorrhea infections as well as others listed below, who initially presented to [facility] on 02/11/2022 with concerns for malaria after recent trip to [place].  We are called now in regards to increasing LFTs. ?   She presented to the ED with a complaint of fever, body aches, fatigue/dizziness, vomiting/diarrhea since returning.  Apparently went to a travel clinic and received all vaccinations but did not take her malaria prophylaxis while abroad.  WBC 1.8, platelets 91 K, parasites on smear.  Potassium 3.4, T. 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  Patient traveled to [place] and was there for 3 weeks.  She returned from her trip on 02/02/2022 and 5 days later started having symptoms. ?   Patient was moved to [facility] for continuous EEG evaluation given that she continues to have spells of unresponsiveness.  Neurology and infectious disease are following. ?   Today, the patient tells me that she has never had trouble with elevated liver enzymes in the past and still has no abdominal pain.  In general her symptoms are feeling some better.  Denies family history of liver problems. ?    Denies history of IV drug use or blood transfusions. ? ?GI history: ?08/07/2015 with EGD/EUS and biopsy of a papillary adenoma (cannot see full report) ? ?Past Medical History:  ?Diagnosis Date  ? Brain tumor (benign) ([place])   ? Chlamydia   ? Gonorrhea   ? ? ?Past Surgical History:  ?Procedure Laterality Date  ? NO PAST SURGERIES    ? ? ?Family History  ?Problem Relation Age of Onset  ? Cancer Mother   ? Diabetes Paternal Grandfather   ? ? ?Social History  ? ?Tobacco Use  ? Smoking status: Never  ? Smokeless tobacco: Never  ?Vaping Use  ? Vaping Use: Never used  ?Substance Use Topics  ? Alcohol use: Yes  ?  Comment: occasional  ? Drug use: No  ? ? ?Prior to Admission medications   ?Medication Sig Start Date End Date Taking? Authorizing Provider  ?folic acid (FOLVITE) 1 MG tablet Take 1 mg by mouth daily.   Yes [provider]  ?ibuprofen (ADVIL,MOTRIN) 600 MG tablet Take 1 tablet (600 mg total) by mouth every 6 (six) hours as needed. ?Patient taking differently: Take 600 mg by mouth every 6 (six) hours as needed for mild pain, cramping or headache. 04/17/18  Yes Law, Bea [place], PA-C  ? ? ?Current Facility-Administered Medications  ?Medication Dose Route Frequency Provider Last Rate Last Admin  ? 0.9 %  sodium chloride infusion   Intravenous Continuous Elgergawy, Silver Huguenin, MD 75 mL/hr at 02/14/22 1024 Rate Change at 02/14/22 1024  ? artesunate 110 MG IV syringe 295.7 mg  2.4 mg/kg Intravenous Q24H Rai, Ripudeep K, MD   295.7 mg at 02/13/22 2241  ? aspirin EC tablet 81 mg  81 mg Oral Daily Greta Doom, MD   81 mg at 02/14/22 1028  ? loperamide (IMODIUM) capsule 2 mg  2 mg Oral PRN Rai, Ripudeep K, MD      ? morphine (PF) 2 MG/ML injection 1 mg  1 mg Intravenous Q4H PRN Rai, Ripudeep K, MD   1 mg at 02/12/22 1010  ? ondansetron (ZOFRAN) injection 4 mg  4 mg Intravenous Q6H PRN Rai, Ripudeep K, MD   4 mg at 02/13/22 1904  ? potassium chloride SA (KLOR-CON M) CR tablet 40 mEq  40 mEq Oral Q4H  Elgergawy, Silver Huguenin, MD   40 mEq at 02/14/22 0934  ? ? ?Allergies as of 02/11/2022 - Review Complete 02/11/2022  ?Allergen Reaction Noted  ? Iodine Shortness Of Breath and Itching 02/08/2017  ? ? ? ?Review of Systems:    ?Constitutional: No weight loss ?Skin: No rash  ?Cardiovascular: No chest pain  ?Respiratory: No SOB  ?Gastrointestinal: See HPI and otherwise negative ?Genitourinary: No dysuria  ?Neurological: No headache ?Musculoskeletal: No new muscle or joint pain ?Hematologic: No bleeding  ?Psychiatric: No history of depression or anxiety  ? ? Physical Exam:  ?Vital signs in last 24 hours: ?Temp:  [97.9 ?F (36.6 ?C)-99.4 ?F (37.4 ?C)] 98 ?F (36.7 ?C) (05/04 0747) ?Pulse Rate:  [77-100] 78 (05/04 0747) ?Resp:  [14-19] 16 (05/04 0747) ?BP: (106-116)/(63-71) 108/66 (05/04 0747) ?SpO2:  [94 %-100 %] 100 % (05/04 0747) ?Last BM Date : 02/13/22 ?General:   Pleasant African-American female appears to be in NAD, Well developed, Well nourished, alert and cooperative ?Head:  Normocephalic and atraumatic. ?Eyes:   PEERL, EOMI. No icterus. Conjunctiva pink. ?Ears:  Normal auditory acuity. ?Neck:  Supple ?Throat: Oral cavity and pharynx without inflammation, swelling or lesion.  ?Lungs: Respirations even and unlabored. Lungs clear to auscultation bilaterally.   No wheezes, crackles, or rhonchi.  ?Heart: Normal S1, S2. No MRG. Regular rate and rhythm. No peripheral edema, cyanosis or pallor.  ?Abdomen:  Soft, nondistended, nontender. No rebound or guarding. Normal bowel sounds. No appreciable masses or hepatomegaly. ?Rectal:  Not performed.  ?Msk:  Symmetrical without gross deformities. Peripheral pulses intact.  ?Extremities:  Without edema, no deformity or joint abnormality.  ?Neurologic:  Alert and  oriented x4;  grossly normal neurologically.  ?Skin:   Dry and intact without significant lesions or rashes. ?Psychiatric: Demonstrates good judgement and reason without abnormal affect or behaviors. ? ? ?LAB RESULTS: ?Recent  Labs  ?  02/12/22 ?0722 02/13/22 ?0759 02/14/22 ?0351  ?WBC 2.2* 2.4* 1.7*  ?HGB 12.1 11.3* 11.1*  ?HCT 34.8* 32.0* 30.7*  ?PLT 74* 77* 82*  ? ?BMET ?Recent Labs  ?  02/13/22 ?0759 02/14/22 ?0351 02/14/22 ?0715  ?NA 131* 137 137  ?K 3.2* 3.4* 3.6  ?CL 104 108 108  ?CO2 21* 24 23  ?GLUCOSE 121* 92 90  ?BUN 14 10 10   ?CREATININE 0.78 0.71 0.73  ?CALCIUM 7.2* 7.6* 7.6*  ? ? ?  Latest Ref Rng & Units 02/14/2022  ?  7:15 AM 02/14/2022  ?  3:51 AM 02/13/2022  ?  7:59 AM  ?Hepatic Function  ?Total Protein 6.5 - 8.1 g/dL 5.1   5.1   5.7    ?Albumin 3.5 - 5.0 g/dL 2.2   2.3   2.7    ?AST 15 - 41 U/L 1,579  1,557   726    ?ALT 0 - 44 U/L 1,574   1,472   500    ?Alk Phosphatase 38 - 126 U/L 56   57   43    ?Total Bilirubin 0.3 - 1.2 mg/dL 0.8   0.6   0.6    ?  ? ?PT/INR ?No results for input(s): LABPROT, INR in the last 72 hours. ? ?STUDIES: ?MR BRAIN W WO CONTRAST ? ?Result Date: 02/12/2022 ?CLINICAL DATA:  Fever, body aches, dizziness and fatigue. Rule out meningitis or CNS infection. Recent travel to [place] EXAM: MRI HEAD WITHOUT AND WITH CONTRAST TECHNIQUE: Multiplanar, multiecho pulse sequences of the brain and surrounding structures were obtained without and with intravenous contrast. CONTRAST:  43m GADAVIST GADOBUTROL 1 MMOL/ML IV SOLN COMPARISON:  None Available. FINDINGS: Brain: 1 cm area of restricted diffusion in the splenium of the corpus callosum. This shows increased signal on diffusion and low signal on ADC map. This is not visualized on T2 or FLAIR and does not enhance. No other areas of restricted diffusion Ventricle size normal. No other areas of infarct, hemorrhage, or mass lesion. Normal enhancement postcontrast administration. Pituitary normal in size. History of pituitary micro adenoma, not visualized on the study. Vascular: Normal arterial flow voids Skull and upper cervical spine: Negative Sinuses/Orbits: Mild mucosal edema paranasal sinuses. Negative orbit Other: None IMPRESSION: 1 cm area of restricted  diffusion in the splenium of the corpus callosum. This is not visualized on T2 or FLAIR and does not enhance. This may represent an acute infarct. Otherwise normal MRI of the brain. No evidence of abnormal enhanceme

## 2022-02-15 DIAGNOSIS — R7401 Elevation of levels of liver transaminase levels: Secondary | ICD-10-CM | POA: Diagnosis not present

## 2022-02-15 DIAGNOSIS — R4182 Altered mental status, unspecified: Secondary | ICD-10-CM | POA: Diagnosis not present

## 2022-02-15 DIAGNOSIS — B54 Unspecified malaria: Secondary | ICD-10-CM | POA: Diagnosis not present

## 2022-02-15 DIAGNOSIS — R6520 Severe sepsis without septic shock: Secondary | ICD-10-CM | POA: Diagnosis not present

## 2022-02-15 DIAGNOSIS — A419 Sepsis, unspecified organism: Secondary | ICD-10-CM | POA: Diagnosis not present

## 2022-02-15 DIAGNOSIS — G9340 Encephalopathy, unspecified: Secondary | ICD-10-CM | POA: Diagnosis not present

## 2022-02-15 LAB — MISC LABCORP TEST (SEND OUT)
Labcorp test code: 9985
Labcorp test code: 2012023

## 2022-02-15 LAB — COMPREHENSIVE METABOLIC PANEL
ALT: 1211 U/L — ABNORMAL HIGH (ref 0–44)
AST: 732 U/L — ABNORMAL HIGH (ref 15–41)
Albumin: 2.3 g/dL — ABNORMAL LOW (ref 3.5–5.0)
Alkaline Phosphatase: 71 U/L (ref 38–126)
Anion gap: 4 — ABNORMAL LOW (ref 5–15)
BUN: 6 mg/dL (ref 6–20)
CO2: 23 mmol/L (ref 22–32)
Calcium: 7.3 mg/dL — ABNORMAL LOW (ref 8.9–10.3)
Chloride: 107 mmol/L (ref 98–111)
Creatinine, Ser: 0.82 mg/dL (ref 0.44–1.00)
GFR, Estimated: >60 mL/min (ref >60)
Glucose, Bld: 88 mg/dL (ref 70–99)
Potassium: 3.8 mmol/L (ref 3.5–5.1)
Sodium: 134 mmol/L — ABNORMAL LOW (ref 135–145)
Total Bilirubin: 1.1 mg/dL (ref 0.3–1.2)
Total Protein: 5.1 g/dL — ABNORMAL LOW (ref 6.5–8.1)

## 2022-02-15 LAB — CBC WITH DIFFERENTIAL/PLATELET
Abs Immature Granulocytes: 0.01 10*3/uL (ref 0.00–0.07)
Basophils Absolute: 0 10*3/uL (ref 0.0–0.1)
Basophils Relative: 1 %
Eosinophils Absolute: 0 10*3/uL (ref 0.0–0.5)
Eosinophils Relative: 0 %
HCT: 29.7 % — ABNORMAL LOW (ref 36.0–46.0)
Hemoglobin: 10.6 g/dL — ABNORMAL LOW (ref 12.0–15.0)
Immature Granulocytes: 1 %
Lymphocytes Relative: 38 %
Lymphs Abs: 0.5 10*3/uL — ABNORMAL LOW (ref 0.7–4.0)
MCH: 30.1 pg (ref 26.0–34.0)
MCHC: 35.7 g/dL (ref 30.0–36.0)
MCV: 84.4 fL (ref 80.0–100.0)
Monocytes Absolute: 0.2 10*3/uL (ref 0.1–1.0)
Monocytes Relative: 12 %
Neutro Abs: 0.6 10*3/uL — ABNORMAL LOW (ref 1.7–7.7)
Neutrophils Relative %: 48 %
Platelets: 126 10*3/uL — ABNORMAL LOW (ref 150–400)
RBC: 3.52 MIL/uL — ABNORMAL LOW (ref 3.87–5.11)
RDW: 12.8 % (ref 11.5–15.5)
WBC: 1.2 10*3/uL — CL (ref 4.0–10.5)
nRBC: 0 % (ref 0.0–0.2)

## 2022-02-15 LAB — IGA: IgA: 278 mg/dL (ref 87–352)

## 2022-02-15 LAB — MITOCHONDRIAL ANTIBODIES: Mitochondrial M2 Ab, IgG: <20 Units (ref 0.0–20.0)

## 2022-02-15 LAB — ANTI-SMOOTH MUSCLE ANTIBODY, IGG: F-Actin IgG: 32 Units — ABNORMAL HIGH (ref 0–19)

## 2022-02-15 LAB — ALPHA-1-ANTITRYPSIN: A-1 Antitrypsin, Ser: 179 mg/dL (ref 100–188)

## 2022-02-15 LAB — ANA W/REFLEX IF POSITIVE: Anti Nuclear Antibody (ANA): NEGATIVE

## 2022-02-15 LAB — CERULOPLASMIN: Ceruloplasmin: 28.9 mg/dL (ref 19.0–39.0)

## 2022-02-15 NOTE — Progress Notes (Signed)
LTM EEG discontinued - no skin breakdown at [facility]. Pt had pulled all her leads off.  ? ?

## 2022-02-15 NOTE — Progress Notes (Signed)
?      ? ? ? [facility] ?                                                                           ? ? ?Maria Morgan, is a 31 y.o. female, DOB - 1991-06-30, XKG:[id number] ?Admit date - 02/11/2022    ?Outpatient Primary MD for the patient is Patient, No Pcp Per (Inactive) ? ?LOS - 3  days ? ?Chief Complaint  ?Patient presents with  ? Generalized Body Aches  ?    ? ?Brief summary  ? ?Patient is a 31 year old female with a history of pituitary adenoma associated with hyperprolactinemia, familial multiple polyposis syndrome, class III obesity (BMI 41.30), ADHD, history of chlamydia and gonorrhea infections.  She presented to ED with fevers, body aches, fatigue, dizziness, vomiting/diarrhea since after she returned from a trip to [place].  Patient went to travel clinic and received all vaccinations but did not take her malaria prophylaxis while abroad.   ?In ED, vital signs stable.  WBC 1.8, platelets 91k, parasites on smear. K 3.4.  T. bili 1.4, remainder of LFTs normal.  Lipase normal.  COVID and flu negative.  UA with negative nitrite, negative leukocytes, and microscopy showing 0-5 WBCs and many bacteria.  ? Urine pregnancy test negative.  Chest x-ray not suggestive of pneumonia.  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  Patient was given IV artesunate, Imodium, Zofran, and 1 L normal saline bolus in the ED. ?  ?Patient states she recently traveled abroad to [place] and was there for about 3 weeks.  She received vaccinations prior to travel but did not take malaria prophylaxis.  She returned from her trip on April 22 and about 5 days prior to admission, started having symptoms including fevers, chills, body aches, fatigue, lightheadedness, headaches, nausea, generalized abdominal pain, and nonbloody diarrhea.  She has not been able to eat anything due to nausea but has not vomited.  Also reports cough and shortness of breath, no  chest pain.  ?Had an episodes of intermittent unresponsiveness for which she was transferred from [facility] long hospital to [facility] for continuous EEG. ? ? ?Assessment & Plan  ? ? ? ?Severe sepsis secondary to suspected malaria ?-Patient met sepsis criteria with fevers, tachycardia, leukopenia, transaminitis, acute encephalopathy ?-COVID, flu negative.  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 ?-Antibiotics discontinued with no evidence of meningitis ? ?Acute toxic encephalopathy with intermittent unresponsive episodes ?-CT head, CTA head and neck unremarkable ?-EEG normal finding, no epileptiform discharges ?-MRI showed 1 cm area of restricted diffusion in the splenium of corpus callosum.  Discussed with neurology, Dr. Leonel Ramsay, this is not a acute infarct and likely can be cytotoxic lesion associated with malaria.  Recommended continuous EEG as she is having intermittent spells and transfer to [facility]. ?-Neurology input greatly appreciated, EEG has been performed, no evidence of elliptiform discharges ? ?Acute leukopenia, thrombocytopenia ?-Associated with acute miliaria, continue to monitor ? ?Acute transaminitis ?-Possibly due to acute  malaria, GI input greatly appreciated ?-Trending down ?-normal right upper quadrant ultrasound ?-Negative hepatitis panel ? ?  ?Possible STD:  ?-Per Dr. Doristine Bosworth, patient's best friend at the bedside had reported that patient had unprotected sexual intercourse multiple times with one partner while she was in [place].   ?- RPR, Neisseria and coronaria are negative ?  ?Hypokalemia ?-Likely due to diarrhea, replaced ? ? Nonbloody diarrhea ?- Likely due to malaria.  Currently diarrhea improving. ?-Follow-up GI pathogen and Cdiff  ? ?  ?History of pituitary adenoma associated with  hyperprolactinemia ?Not on any medications at this time. ?-Outpatient endocrinology follow-up. ?  ?Class III obesity (BMI 41.30) ?Estimated body mass index is 41.3 kg/m? as calculated from the following: ?  Height as of this encounter: 5' 8"  (1.727 m). ?  Weight as of this encounter: 123.2 kg. ? ?Code Status: Full code ?DVT Prophylaxis:  Place and maintain sequential compression device Start: 02/14/22 0658 ?SCDs Start: 02/11/22 2257 ? ? ?Level of Care: Level of care: Telemetry Medical ?Family Communication: None at bedside ? ? ?Disposition Plan:      Remains inpatient appropriate: Work-up in progress,  ? ?Procedures:  ?Spinal tap ? ?Consultants:   ?Neurology ?Infectious disease ?GI ? ?Antimicrobials:  ? ?Anti-infectives (From admission, onward)  ? ? Start     Dose/Rate Route Frequency Ordered Stop  ? 02/14/22 1215  atovaquone-proguanil (MALARONE) 250-100 MG per tablet 1 tablet  Status:  Discontinued       ? 1 tablet Oral Daily 02/14/22 1118 02/14/22 1146  ? 02/14/22 1215  atovaquone-proguanil (MALARONE) 250-100 MG per tablet 4 tablet       ? 4 tablet Oral Daily 02/14/22 1146 02/16/22 2359  ? 02/13/22 2200  artesunate 110 MG IV syringe 295.7 mg  Status:  Discontinued       ? 2.4 mg/kg ? 123.2 kg Intravenous Every 24 hours 02/13/22 1146 02/14/22 1118  ? 02/13/22 0200  vancomycin (VANCOCIN) IVPB 1000 mg/200 mL premix  Status:  Discontinued       ? 1,000 mg ?200 mL/hr over 60 Minutes Intravenous Every 8 hours 02/12/22 1500 02/13/22 0947  ? 02/12/22 1300  cefTRIAXone (ROCEPHIN) 2 g in sodium chloride 0.9 % 100 mL IVPB  Status:  Discontinued       ? 2 g ?200 mL/hr over 30 Minutes Intravenous Every 12 hours 02/12/22 1208 02/13/22 0947  ? 02/12/22 1300  vancomycin (VANCOREADY) IVPB 2000 mg/400 mL       ? 2,000 mg ?200 mL/hr over 120 Minutes Intravenous  Once 02/12/22 1208 02/12/22 1915  ? 02/11/22 2200  artesunate 110 MG IV syringe 295.7 mg       ? 2.4 mg/kg ? 123.2 kg Intravenous Every 12 hours 02/11/22 1649 02/12/22 2209   ? 02/11/22 1700  artemether-lumefantrine (COARTEM) 20-120 MG tablet 4 tablet  Status:  Discontinued       ? 4 tablet Oral  Once 02/11/22 1646 02/11/22 1720  ? ?  ? ? ? ? ? ?Medications ? aspirin EC  81 mg Oral Daily  ? atovaquone-proguanil  4 tablet Oral Daily  ? ? ? ? ?Subjective:  ? ?Maria Morgan was seen and examined today.  with female chaperone RN Varney Biles at bedside , will he denies dyspnea, or chest pain, reports generalized weakness but it is improving, reports 1 loose bowel movement yesterday evening. ? ?Objective:  ? ?Vitals:  ? 02/15/22 0001 02/15/22 0421 02/15/22 0905 02/15/22 1204  ?BP: 113/62 120/69 116/63 118/78  ?Pulse: 95 91  91 86  ?Resp:   16 20  ?Temp: 98.9 ?F (37.2 ?C) 98.9 ?F (37.2 ?C) 99 ?F (37.2 ?C) 99.2 ?F (37.3 ?C)  ?TempSrc: Oral Oral Oral Oral  ?SpO2: 98% 97% 99% 93%  ?Weight:      ?Height:      ? ? ?Intake/Output Summary (Last 24 hours) at 02/15/2022 1357 ?Last data filed at 02/15/2022 1251 ?Gross per 24 hour  ?Intake 3103.97 ml  ?Output 1 ml  ?Net 3102.97 ml  ? ? ? ? ?Wt Readings from Last 3 Encounters:  ?02/11/22 123.2 kg  ?08/13/21 127.5 kg  ?09/01/20 119.7 kg  ? ? ? ?Exam ? ?Patient was seen and examined with a female chaperone at bedside , RN Monro Beverlee Nims ? ?Awake Alert, Oriented X 3, No new F.N deficits, Normal affect ?Symmetrical Chest wall movement, Good air movement bilaterally, CTAB ?RRR,No Gallops,Rubs or new Murmurs, No Parasternal Heave ?+ve B.Sounds, Abd Soft, No tenderness, No rebound - guarding or rigidity. ?No Cyanosis, Clubbing or edema, No new Rash or bruise   ? ? ? ? ? ?Data Reviewed:  I have personally reviewed following labs  ? ? ?CBC ?Lab Results  ?Component Value Date  ? WBC 1.2 (LL) 02/15/2022  ? RBC 3.52 (L) 02/15/2022  ? HGB 10.6 (L) 02/15/2022  ? HCT 29.7 (L) 02/15/2022  ? MCV 84.4 02/15/2022  ? MCH 30.1 02/15/2022  ? PLT 126 (L) 02/15/2022  ? MCHC 35.7 02/15/2022  ? RDW 12.8 02/15/2022  ? LYMPHSABS 0.5 (L) 02/15/2022  ? MONOABS 0.2 02/15/2022  ? EOSABS 0.0  02/15/2022  ? BASOSABS 0.0 02/15/2022  ? ? ? ?Last metabolic panel ?Lab Results  ?Component Value Date  ? NA 134 (L) 02/15/2022  ? K 3.8 02/15/2022  ? CL 107 02/15/2022  ? CO2 23 02/15/2022  ? BUN 6 02/15/2022  ? CREATININE

## 2022-02-15 NOTE — Procedures (Signed)
Patient Name: Maria Morgan  ?MRN: [id number]  ?Epilepsy Attending: Lora Havens  ?Referring Physician/Provider: Greta Doom, MD ?Duration: 02/14/2022 1019 to 02/15/2022 0930 ? 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  EEG to evaluate for seizure. ?  ?Level of alertness: Awake, asleep ?  ?AEDs during EEG study: None ?  ?Technical aspects: This EEG study was done with scalp electrodes positioned according to the 10-20 International system of electrode placement. Electrical activity was acquired at a sampling rate of '500Hz'$  and reviewed with a high frequency filter of '70Hz'$  and a low frequency filter of '1Hz'$ . EEG data were recorded continuously and digitally stored.  ?  ?Description: The posterior dominant rhythm consists of  8-9 Hz activity of moderate voltage (25-35 uV) seen predominantly in posterior head regions, symmetric and reactive to eye opening and eye closing. Sleep was characterized by vertex waves, sleep spindles (12 to 14 Hz), maximal frontocentral region. Hyperventilation and photic stimulation were not performed.   ? ?Patient pulled electrodes after around 0930 to 02/15/2022.  ?  ?IMPRESSION: ?This study is within normal limits. No seizures or epileptiform discharges were seen throughout the recording. ?  ?Lora Havens   ?

## 2022-02-15 NOTE — Progress Notes (Signed)
?  [facility] for Infectious Disease ? ? ?Reason for visit: Follow up on malaria ? ?Interval History: EEG report now with no seizure activity.  WBC down to 1.2.  Feels better today compared to yesterday.  ? ? ?Physical Exam: ?Constitutional:  ?Vitals:  ? 02/15/22 0421 02/15/22 0905  ?BP: 120/69 116/63  ?Pulse: 91 91  ?Resp:  16  ?Temp: 98.9 ?F (37.2 ?C) 99 ?F (37.2 ?C)  ?SpO2: 97% 99%  ? patient appears in NAD ?Respiratory: Normal respiratory effort; CTA B ?Cardiovascular: RRR ? ? ?Review of Systems: ?Constitutional: negative for fevers and chills ?Gastrointestinal: negative for nausea and diarrhea ? ?Lab Results  ?Component Value Date  ? WBC 1.2 (LL) 02/15/2022  ? HGB 10.6 (L) 02/15/2022  ? HCT 29.7 (L) 02/15/2022  ? MCV 84.4 02/15/2022  ? PLT 126 (L) 02/15/2022  ?  ?Lab Results  ?Component Value Date  ? CREATININE 0.82 02/15/2022  ? BUN 6 02/15/2022  ? NA 134 (L) 02/15/2022  ? K 3.8 02/15/2022  ? CL 107 02/15/2022  ? CO2 23 02/15/2022  ?  ?Lab Results  ?Component Value Date  ? ALT 1,211 (H) 02/15/2022  ? AST 732 (H) 02/15/2022  ? ALKPHOS 71 02/15/2022  ?  ? ?Microbiology: ?Recent Results (from the past 240 hour(s))  ?Resp Panel by RT-PCR (Flu A&B, Covid) Nasopharyngeal Swab     Status: None  ? Collection Time: 02/11/22  1:15 PM  ? Specimen: Nasopharyngeal Swab; Nasopharyngeal(NP) swabs in vial transport medium  ?Result Value Ref Range Status  ? SARS Coronavirus 2 by RT PCR NEGATIVE NEGATIVE Final  ?  Comment: (NOTE) ?SARS-CoV-2 target nucleic acids are NOT DETECTED. ? ?The SARS-CoV-2 RNA is generally detectable in upper respiratory ?specimens during the acute phase of infection. The lowest ?concentration of SARS-CoV-2 viral copies this assay can detect is ?138 copies/mL. A negative result does not preclude SARS-Cov-2 ?infection and should not be used as the sole basis for treatment or ?other patient management decisions. A negative result may occur with  ?improper specimen collection/handling, submission of  specimen other ?than nasopharyngeal swab, presence of viral mutation(s) within the ?areas targeted by this assay, and inadequate number of viral ?copies(<138 copies/mL). A negative result must be combined with ?clinical observations, patient history, and epidemiological ?information. The expected result is Negative. ? ?Fact Sheet for Patients:  ?EntrepreneurPulse.com.au ? ?Fact Sheet for Healthcare Providers:  ?IncredibleEmployment.be ? ?This test is no t yet approved or cleared by the [place] FDA and  ?has been authorized for detection and/or diagnosis of SARS-CoV-2 by ?FDA under an Emergency Use Authorization (EUA). This EUA will remain  ?in effect (meaning this test can be used) for the duration of the ?COVID-19 declaration under Section 564(b)(1) of the Act, 21 ?U.S.C.section 360bbb-3(b)(1), unless the authorization is terminated  ?or revoked sooner.  ? ? ?  ? Influenza A by PCR NEGATIVE NEGATIVE Final  ? Influenza B by PCR NEGATIVE NEGATIVE Final  ?  Comment: (NOTE) ?The Xpert Xpress SARS-CoV-2/FLU/RSV plus assay is intended as an aid ?in the diagnosis of influenza from Nasopharyngeal swab specimens and ?should not be used as a sole basis for treatment. Nasal washings and ?aspirates are unacceptable for Xpert Xpress SARS-CoV-2/FLU/RSV ?testing. ? ?Fact Sheet for Patients: ?EntrepreneurPulse.com.au ? ?Fact Sheet for Healthcare Providers: ?IncredibleEmployment.be ? ?This test is not yet approved or cleared by the [place] FDA and ?has been authorized for detection and/or diagnosis of SARS-CoV-2 by ?FDA under an Emergency Use Authorization (EUA). This EUA  will remain ?in effect (meaning this test can be used) for the duration of the ?COVID-19 declaration under Section 564(b)(1) of the Act, 21 U.S.C. ?section 360bbb-3(b)(1), unless the authorization is terminated or ?revoked. ? ?Performed at [facility], [address] [place], [place] [address] ?  ?Culture, blood (routine x 2)     Status: None (Preliminary result)  ? Collection Time: 02/11/22  2:39 PM  ? Specimen: BLOOD  ?Result Value Ref Range Status  ? Specimen Description   Final  ?  BLOOD RIGHT ANTECUBITAL ?Performed at [facility], [address], [place], [place] [address] ?  ? Special Requests   Final  ?  BOTTLES DRAWN AEROBIC AND ANAEROBIC Blood Culture results may not be optimal due to an inadequate volume of blood received in culture bottles ?Performed at [facility], [address] [place], [place] [address] ?  ? Culture   Final  ?  NO GROWTH 4 DAYS ?Performed at [facility], [place] [address] [place], [place] [address] ?  ? Report Status PENDING  Incomplete  ?Urine Culture     Status: None  ? Collection Time: 02/11/22  2:40 PM  ? Specimen: Urine, Clean Catch  ?Result Value Ref Range Status  ? Specimen Description   Final  ?  URINE, CLEAN CATCH ?Performed at [facility], [address], [place], [place] [address] ?  ? Special Requests   Final  ?  NONE ?Performed at [facility], [address] [place], [place] [address] ?  ? Culture   Final  ?  NO GROWTH ?Performed at [facility], [place] [address] [place], [place] [address] ?  ? Report Status 02/12/2022 FINAL  Final  ?Culture, blood (routine x 2)     Status: None (Preliminary result)  ? Collection Time: 02/11/22  2:49 PM  ? Specimen: BLOOD  ?Result Value Ref Range Status  ? Specimen Description   Final  ?  BLOOD BLOOD LEFT HAND ?Performed at [facility], [address], [place], [place] [address] ?  ? Special Requests   Final  ?  BOTTLES DRAWN AEROBIC AND ANAEROBIC Blood Culture adequate volume ?Performed at [facility], [address], [place], [place] [address] ?  ? Culture   Final  ?  NO GROWTH 4 DAYS ?Performed at [facility], [place] [address], [place], [place] [address] ?  ? Report Status PENDING  Incomplete  ?Urine Culture      Status: Abnormal  ? Collection Time: 02/11/22  8:34 PM  ? Specimen: Urine, Random  ?Result Value Ref Range Status  ? Specimen Description   Final  ?  URINE, RANDOM ?Performed at [facility], [place] [address], [place], [place] [address] ?  ? Special Requests   Final  ?  NONE ?Performed at [facility], [place] [address] [place], [place] [address] ?  ? Culture MULTIPLE SPECIES PRESENT, SUGGEST RECOLLECTION (A)  Final  ? Report Status 02/13/2022 FINAL  Final  ?CSF culture w Gram Stain     Status: None (Preliminary result)  ? Collection Time: 02/12/22  3:56 PM  ? Specimen: CSF; Cerebrospinal Fluid  ?Result Value Ref Range Status  ? Specimen Description   Final  ?  CSF ?Performed at [facility], [place] [address] [place],  [address] ?  ? Special Requests  Final  ?  NONE ?Performed at [facility], [place] [address] [place], [place] [address] ?  ? Gram Stain   Final  ?  NO ORGANISMS SEEN ?NO WBC SEEN ?Gram Stain Report Called to,Read Back By and Verified With: RN K  SEGAL AT 0175 02/12/22 [place] A ?  ? Culture   Final  ?  NO GROWTH 3 DAYS ?Performed at [facility], [place] [address] [place], [place] [address] ?  ? Report Status PENDING  Incomplete  ? ? ?Impression/Plan:  ?1. Malaria - unclear percent parasitemia at this point and all smears/results still pending.  Most likely falciparum but still could be vivax which would require post treatment primaquine.  At this point, on malarone now and stable.   ?Can treat with malarone for 2 more days ? ?2.  Transaminitis - significantly more elevated than typical for malaria but trending down today.  May be partially due to artesunate.  ?As long as it continues to trend down, ok for discharge from an ID standpoint tomorrow ? ?3.  Neurology - unclear etiology of her episodes that she experienced at [facility].  EEG though has no seizure activity.  Monitoring discontinued.   ? ? ? ?  ?

## 2022-02-15 NOTE — Progress Notes (Addendum)
Subjective: ?Patient laying in bed on the phone. She states she is feeling better and has not had any more spells. She has removed most of the EEG leads.  ? ?Exam: ?Vitals:  ? 02/15/22 0421 02/15/22 0905  ?BP: 120/69 116/63  ?Pulse: 91 91  ?Resp:  16  ?Temp: 98.9 ?F (37.2 ?C) 99 ?F (37.2 ?C)  ?SpO2: 97% 99%  ? ?Gen: In bed, NAD ?Resp: non-labored breathing, no acute distress ?Abd: soft, nt ? ? ?Physical Exam  ?Constitutional: Appears well-developed and well-nourished.  ?Cardiovascular: Normal rate and regular rhythm.  ?Respiratory: Effort normal, non-labored breathing ? ?Neuro: ?Mental Status: ?Patient is awake and oriented to person, place, month, year, and situation. ?Patient is able to give a clear and coherent history. ?No signs of aphasia or neglect ?Cranial Nerves: ?II: Visual Fields are full. PERRL 64m, brisk ?III,IV, VI: EOMI without ptosis or diploplia.  ?V: Facial sensation is symmetric to temperature ?VII: Facial movement is symmetric resting and smiling ?VIII: Hearing is intact to voice ?X: Palate elevates symmetrically ?XI: Shoulder shrug is symmetric. ?XII: Tongue protrudes midline without atrophy or fasciculations.  ?Motor: ?Tone is normal. Bulk is normal.  ?BUE 5/5 ?BLE 4/5  ?Sensory: ?Sensation is symmetric to light touch and temperature in the arms and legs. No extinction to DSS present.  ?Cerebellar: ?FNF and HKS are intact bilaterally ? ? ?Pertinent Labs: ?WBC 1.2 ?Hgb/Hct 10.6/29.7 ?Na 134 ?K 3.8 ?Albumin 3.0 ?AST/ALT- 39/37 ?Pathologist smear- Marked neutropenia and slight thrombocytopenia. Erythrocyte, leukocyte, and platelet morphology is normal. No immature leukocytes are present ?HSV CSF pending  ? ?Code stroke CT-scan of the brain 5/2 ?Negative for acute process  ?  ?CTA H/N  5/2 ?No evidence of emergent large vessel occlusion or proximal high-grade stenosis in the head or neck ?  ?MRI examination of the brain 5/2 ?1 cm area of restricted diffusion in the splenium of the corpus callosum.  This is not visualized on T2 or FLAIR and does not enhance. This may represent an acute infarct. ? Otherwise normal MRI of the brain. No evidence of abnormal enhancement, fluid collection, or mass lesion. ?  ?EEG 5/2: ?This study is within normal limits. No seizures or epileptiform discharges were seen throughout the recording. ? ?Overnight EEG 5/4-5/5: ?This study is within normal limits. No seizures or epileptiform discharges were seen throughout the recording. ? ?Impression: 31year old with a past history of pituitary adenoma with hyperprolactinemia, familial polyposis syndrome, class III obesity,-admitted for concern for possible new falciparum malaria [place], who was sitting in chair, having a conversation and suddenly became drowsy and unresponsive for the RN along with question of left-sided gaze deviation.  ? ?Acute encephalopathy with intermittent unresponsive episodes  ? ?Recommendations: ?- Can d/c LTM  ? ?DBeulah GandyDNP, ACNPC-AG  ? ?I have seen the patient reviewed the above note.  Her episodes are of unclear etiology, but she is not having any more.  She has requested her LTM be discontinued, and therefore was taken off earlier.  At this time, if she has no further spells, no further work-up is needed.  If she continues to have further spells, I would continue to desire to characterize these. ? 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 ? ?Given episodes of sudden change in mental status, I would favor holding off on driving until released by an outpatient physician. ? ?No further recommendations at this time, please call with further questions or concerns. ? ?Roland Rack,  MD ?[facility] ?[phone] ? ?If 7pm- 7am, please page neurology on call as listed in [place]. ? ? ? ?

## 2022-02-15 NOTE — Progress Notes (Signed)
? ? Progress Note ? ?Primary GI: [facility]- will need to ask where she wants to continue care.  ? ? Subjective  ?Chief Complaint: Elevated LFTs in the setting of likely malaria      ? ?Patient walking to the restroom and came to the room. ?States she is still not feeling well but denies nausea, vomiting today.  1 episode of emesis yesterday nonbloody. ?Denies any abdominal pain. ?1 episodes of loose stools this morning no melena or hematochezia. ? ? ? Objective  ? ?Vital signs in last 24 hours: ?Temp:  [97.7 ?F (36.5 ?C)-99 ?F (37.2 ?C)] 99 ?F (37.2 ?C) (05/05 9937) ?Pulse Rate:  [89-97] 91 (05/05 0905) ?Resp:  [16] 16 (05/05 0905) ?BP: (110-120)/(62-81) 116/63 (05/05 0905) ?SpO2:  [97 %-100 %] 99 % (05/05 0905) ?Last BM Date : 02/13/21 ?Last BM recorded by nurses in past 5 days No data recorded ? ?General:   female in no acute distress  ?Heart:  Regular rate and rhythm; no murmurs ?Pulm: Clear anteriorly; no wheezing ?Abdomen:  Soft, Obese AB, Active bowel sounds. No tenderness , No organomegaly appreciated. ?Extremities:  without  edema. ?Neurologic:  Alert and  oriented x4;  No focal deficits.  ?Psych:  Cooperative. Normal mood and affect. ? ?Intake/Output from previous day: ?05/04 0701 - 05/05 0700 ?In: 2566.5 [P.O.:600; I.V.:1966.5] ?Out: 1 [Emesis/NG output:1] ?Intake/Output this shift: ?No intake/output data recorded. ? ?Studies/Results: ?Overnight EEG with video ? 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  EEG to evaluate for seizure.  Level of alertness: Awake, asleep  AEDs during  EEG study: None  Technical aspects: This EEG study was done with scalp electrodes positioned according to the 10-20 International system of electrode placement. Electrical activity was acquired at a sampling rate of '500Hz'$  and reviewed with a high frequency filter of '70Hz'$  and a low frequency filter of '1Hz'$ . EEG data were recorded continuously and digitally stored.  Description: The posterior dominant rhythm consists of  8-9 Hz activity of moderate voltage (25-35 uV) seen predominantly in posterior head regions, symmetric and reactive to eye opening and eye closing. Sleep was characterized by vertex waves, sleep spindles (12 to 14 Hz), maximal frontocentral region. Hyperventilation and photic stimulation were not performed.  Patient pulled electrodes after around 0930 to 02/15/2022.  IMPRESSION: This study is within normal limits. No seizures or epileptiform discharges were seen throughout the recording.  Priyanka Barbra Sarks   ? ?US LIVER DOPPLER ? ?Result Date: 02/15/2022 ?CLINICAL DATA:  Increased LFTs EXAM: DUPLEX ULTRASOUND OF LIVER TECHNIQUE: Color and duplex Doppler ultrasound was performed to evaluate the hepatic in-flow and out-flow vessels. COMPARISON:  None Available. FINDINGS: Liver: Normal parenchymal echogenicity. Normal hepatic contour without nodularity. No focal lesion, mass or intrahepatic biliary ductal dilatation. Main Portal Vein size: 1.0 cm Portal Vein Velocities Main Prox:  20 cm/sec Main Mid: 19 cm/sec Main Dist:  13 cm/sec Right: 12 cm/sec Left: 15 cm/sec Hepatic Vein Velocities Right:  24 cm/sec Middle:  36 cm/sec Left:  26 cm/sec IVC: Present and patent with normal respiratory phasicity. Hepatic Artery Velocity:  120 cm/sec Splenic Vein Velocity:  21 cm/sec Spleen: 13.8 cm  x 5.5 cm x 5.4 cm with a total volume of 213 cm^3 (411 cm^3 is upper limit normal) Portal Vein Occlusion/Thrombus: No Splenic Vein Occlusion/Thrombus: No Ascites: None Varices: None Patent portal, hepatic and splenic veins with  normal directional flow. Negative for portal vein occlusion or thrombus. No upper abdominal free fluid or ascites. IMPRESSION: Normal hepatic venous Doppler Electronically Signed   By: Jerilynn Mages.  Shick M.D.   On: 02/15/2022 08:17  ? ?US Abdomen Limited RUQ (LIVER/GB) ? ?Result Date: 02/14/2022 ?CLINICAL DATA:  Transaminitis. EXAM: ULTRASOUND ABDOMEN LIMITED RIGHT UPPER QUADRANT COMPARISON:  None Available. FINDINGS: Gallbladder: No gallstones or wall thickening visualized. No sonographic Murphy sign noted by sonographer. Common bile duct: Diameter: 3.2 mm. Liver: No focal lesion identified. Within normal limits in parenchymal echogenicity. Portal vein is patent on color Doppler imaging with normal direction of blood flow towards the liver. Other: None. IMPRESSION: Normal right upper quadrant ultrasound. Electronically Signed   By: Ronney Asters M.D.   On: 02/14/2022 19:04   ? ?Lab Results: ?Recent Labs  ?  02/13/22 ?0759 02/14/22 ?0351 02/15/22 ?0502  ?WBC 2.4* 1.7* 1.2*  ?HGB 11.3* 11.1* 10.6*  ?HCT 32.0* 30.7* 29.7*  ?PLT 77* 82* 126*  ? ?BMET ?Recent Labs  ?  02/14/22 ?0351 02/14/22 ?0715 02/15/22 ?0502  ?NA 137 137 134*  ?K 3.4* 3.6 3.8  ?CL 108 108 107  ?CO2 '24 23 23  '$ ?GLUCOSE 92 90 88  ?BUN '10 10 6  '$ ?CREATININE 0.71 0.73 0.82  ?CALCIUM 7.6* 7.6* 7.3*  ? ?LFT ?Recent Labs  ?  02/15/22 ?0502  ?PROT 5.1*  ?ALBUMIN 2.3*  ?AST 732*  ?ALT 1,211*  ?ALKPHOS 71  ?BILITOT 1.1  ? ?PT/INR ?Recent Labs  ?  02/14/22 ?1646  ?LABPROT 16.0*  ?INR 1.3*  ? ? ? ? Impression/Plan:  ? ?Elevated LFTs: Extreme elevation over the past 48 hours; high suspicion this is related to malaria but will consider other causes such as autoimmune, blood clot, etc. ?AST 732 ALT 1,211 ? Alkphos 71 TBili 1.1 ?02/14/2022 INR 1.3 ?-LFTs downtrending ?-acute hepatitis panel neg ?Per ID Dr. Linus Salmons, possibly due to artesunate.  ?-Liver Doppler negative thrombosis ?-Normal liver studies so far: ANA, alpha 1 antitrypsin, ceruloplasmin, and IGG, IGA, CPK.  ?-Still  pending: mitochondrial antibody and Anti-smooth muscle antibody, HSV ?- Iron is 112, saturation 46, TIBC 245, ferritin 1395- iron is normal likely more reactive.  ?Hopefully LFTs continue to downtrend, recheck tomorrow, per ID possible discharge tomorrow pending labs ? ?Severe sepsis secondary to suspected malaria: Patient with fevers, tachycardia, leukopenia, transaminitis and acute encephalopathy, COVID and flu negative ?ID is following for malaria ?-Tmax 99 ?- on Malarone ? ?Acute toxic encephalopathy with intermittent unresponsive episodes:  ?CT head, CTA head and neck unremarkable ?EEG with normal finding ?MRI showed 1 cm area of restricted diffusion in the splenium of corpus callosum-thought likely to cytotoxic lesion associated with malaria, now undergoing continuous EEG monitoring ? ?Acute leukopenia and thrombocytopenia:  ?Associated with acute malaria ?WBC 1.2, hemoglobin 10.6, platelets 126 ? ?Hypokalemia:  ?Thought likely due to diarrhea ? ?Nonbloody diarrhea:  ?likely related to malaria  ?If having worsening stools get Cdiff/Gi pathogen.  ? ? ? LOS: 3 days  ? ?Vladimir Crofts  02/15/2022, 10:48 AM ? ? ? ?

## 2022-02-15 NOTE — Progress Notes (Signed)
Arrived to room and patient was in bathroom, came back 10 mins later and she was still not ready for unhook told her we will come back later today for her unhook  ?

## 2022-02-16 DIAGNOSIS — D352 Benign neoplasm of pituitary gland: Secondary | ICD-10-CM | POA: Diagnosis not present

## 2022-02-16 DIAGNOSIS — R7401 Elevation of levels of liver transaminase levels: Secondary | ICD-10-CM | POA: Diagnosis not present

## 2022-02-16 LAB — CBC WITH DIFFERENTIAL/PLATELET
Abs Immature Granulocytes: 0 10*3/uL (ref 0.00–0.07)
Basophils Absolute: 0 10*3/uL (ref 0.0–0.1)
Basophils Relative: 0 %
Eosinophils Absolute: 0 10*3/uL (ref 0.0–0.5)
Eosinophils Relative: 0 %
HCT: 28.6 % — ABNORMAL LOW (ref 36.0–46.0)
Hemoglobin: 10.1 g/dL — ABNORMAL LOW (ref 12.0–15.0)
Lymphocytes Relative: 43 %
Lymphs Abs: 0.5 10*3/uL — ABNORMAL LOW (ref 0.7–4.0)
MCH: 30.2 pg (ref 26.0–34.0)
MCHC: 35.3 g/dL (ref 30.0–36.0)
MCV: 85.6 fL (ref 80.0–100.0)
Monocytes Absolute: 0 10*3/uL — ABNORMAL LOW (ref 0.1–1.0)
Monocytes Relative: 0 %
Neutro Abs: 0.6 10*3/uL — ABNORMAL LOW (ref 1.7–7.7)
Neutrophils Relative %: 53 %
Platelets: 142 10*3/uL — ABNORMAL LOW (ref 150–400)
Promyelocytes Relative: 4 %
RBC: 3.34 MIL/uL — ABNORMAL LOW (ref 3.87–5.11)
RDW: 13.2 % (ref 11.5–15.5)
WBC: 1.2 10*3/uL — CL (ref 4.0–10.5)
nRBC: 0 % (ref 0.0–0.2)

## 2022-02-16 LAB — CSF CULTURE W GRAM STAIN
Culture: NO GROWTH
Gram Stain: NONE SEEN

## 2022-02-16 LAB — COMPREHENSIVE METABOLIC PANEL
ALT: 942 U/L — ABNORMAL HIGH (ref 0–44)
AST: 494 U/L — ABNORMAL HIGH (ref 15–41)
Albumin: 2.4 g/dL — ABNORMAL LOW (ref 3.5–5.0)
Alkaline Phosphatase: 80 U/L (ref 38–126)
Anion gap: 3 — ABNORMAL LOW (ref 5–15)
BUN: <5 mg/dL — ABNORMAL LOW (ref 6–20)
CO2: 25 mmol/L (ref 22–32)
Calcium: 7.7 mg/dL — ABNORMAL LOW (ref 8.9–10.3)
Chloride: 108 mmol/L (ref 98–111)
Creatinine, Ser: 0.75 mg/dL (ref 0.44–1.00)
GFR, Estimated: >60 mL/min (ref >60)
Glucose, Bld: 85 mg/dL (ref 70–99)
Potassium: 3.9 mmol/L (ref 3.5–5.1)
Sodium: 136 mmol/L (ref 135–145)
Total Bilirubin: 1.2 mg/dL (ref 0.3–1.2)
Total Protein: 5.3 g/dL — ABNORMAL LOW (ref 6.5–8.1)

## 2022-02-16 LAB — CULTURE, BLOOD (ROUTINE X 2)
Culture: NO GROWTH
Culture: NO GROWTH
Special Requests: ADEQUATE

## 2022-02-16 NOTE — Progress Notes (Addendum)
?      ? ? ? [facility] ?                                                                           ? ? ?Maria Morgan, is a 31 y.o. female, DOB - Feb 12, 1991, PPI:[id number] ?Admit date - 02/11/2022    ?Outpatient Primary MD for the patient is Patient, No Pcp Per (Inactive) ? ?LOS - 4  days ? ?Chief Complaint  ?Patient presents with  ? Generalized Body Aches  ?    ? ?Brief summary  ? ?Patient is a 31 year old female with a history of pituitary adenoma associated with hyperprolactinemia, familial multiple polyposis syndrome, class III obesity (BMI 41.30), ADHD, history of chlamydia and gonorrhea infections.  She presented to ED with fevers, body aches, fatigue, dizziness, vomiting/diarrhea since after she returned from a trip to [place].  Patient went to travel clinic and received all vaccinations but did not take her malaria prophylaxis while abroad.   ?In ED, vital signs stable.  WBC 1.8, platelets 91k, parasites on smear. K 3.4.  T. bili 1.4, remainder of LFTs normal.  Lipase normal.  COVID and flu negative.  UA with negative nitrite, negative leukocytes, and microscopy showing 0-5 WBCs and many bacteria.  ? Urine pregnancy test negative.  Chest x-ray not suggestive of pneumonia.  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  Patient was given IV artesunate, Imodium, Zofran, and 1 L normal saline bolus in the ED. ?  ?Patient states she recently traveled abroad to [place] and was there for about 3 weeks.  She received vaccinations prior to travel but did not take malaria prophylaxis.  She returned from her trip on April 22 and about 5 days prior to admission, started having symptoms including fevers, chills, body aches, fatigue, lightheadedness, headaches, nausea, generalized abdominal pain, and nonbloody diarrhea.  She has not been able to eat anything due to nausea but has not vomited.  Also reports cough and shortness of breath, no  chest pain.  ?Had an episodes of intermittent unresponsiveness for which she was transferred from [facility] to [facility] for continuous EEG. ? ? ?Assessment & Plan  ? ? ? ?Severe sepsis secondary to suspected malaria ?-Patient met sepsis criteria with fevers, tachycardia, leukopenia, transaminitis, acute encephalopathy ?-COVID, flu negative.  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 ?-Antibiotics discontinued with no evidence of meningitis ? ?Acute toxic encephalopathy with intermittent unresponsive episodes ?-CT head, CTA head and neck unremarkable ?-EEG normal finding, no epileptiform discharges ?-MRI showed 1 cm area of restricted diffusion in the splenium of corpus callosum.  Discussed with neurology, Dr. Leonel Ramsay, this is not a acute infarct and likely can be cytotoxic lesion associated with malaria.  Recommended continuous EEG as she is having intermittent spells and transfer to [facility]. ?-Neurology input greatly appreciated, EEG has been performed, no evidence of elliptiform discharges ? ?Acute leukopenia, thrombocytopenia ?-Associated with acute malaria, continue to monitor ?-Platelet count started to improve, but  white blood cell count remains low. ? ?Acute transaminitis ?-Possibly due to acute malaria, GI input greatly appreciated ?-Trending down, continue to monitor. ?-normal right upper quadrant ultrasound ?-Negative hepatitis panel ? ?  ?Risky Sexual behavior ?-Previous MD, was notified by a patient's friend of risky sexual behavior, please see previous notes, patient was tested for STD,  ?at bedside and had unprotected notified patient Per Dr. Doristine Bosworth, patient's best friend at the bedside had reported that patient had unprotected sexual intercourse multiple times with one partner while she  was in [place].   ?- RPR, Neisseria and gonorrhea are negative, HIV is nonreactive ? ?Hypokalemia ?-Likely due to diarrhea, replaced ? ? Nonbloody diarrhea ?- Likely due to malaria.   ?- resolved ? ?History of pituitary adenoma associated with hyperprolactinemia ?Not on any medications at this time. ?-Outpatient endocrinology follow-up. ?  ?Class III obesity (BMI 41.30) ?Estimated body mass index is 41.3 kg/m? as calculated from the following: ?  Height as of this encounter: 5' 8"  (1.727 m). ?  Weight as of this encounter: 123.2 kg. ? ?Code Status: Full code ?DVT Prophylaxis:  Place and maintain sequential compression device Start: 02/14/22 0658 ?SCDs Start: 02/11/22 2257 ? ? ?Level of Care: Level of care: Telemetry Medical ?Family Communication: None at bedside ? ? ?Disposition Plan:      Remains inpatient appropriate: Work-up in progress, awaiting her LFTs and white blood cell count to improve. ? ?Procedures:  ?Spinal tap ? ?Consultants:   ?Neurology ?Infectious disease ?GI ? ?Antimicrobials:  ? ?Anti-infectives (From admission, onward)  ? ? Start     Dose/Rate Route Frequency Ordered Stop  ? 02/14/22 1215  atovaquone-proguanil (MALARONE) 250-100 MG per tablet 1 tablet  Status:  Discontinued       ? 1 tablet Oral Daily 02/14/22 1118 02/14/22 1146  ? 02/14/22 1215  atovaquone-proguanil (MALARONE) 250-100 MG per tablet 4 tablet       ? 4 tablet Oral Daily 02/14/22 1146 02/16/22 0847  ? 02/13/22 2200  artesunate 110 MG IV syringe 295.7 mg  Status:  Discontinued       ? 2.4 mg/kg ? 123.2 kg Intravenous Every 24 hours 02/13/22 1146 02/14/22 1118  ? 02/13/22 0200  vancomycin (VANCOCIN) IVPB 1000 mg/200 mL premix  Status:  Discontinued       ? 1,000 mg ?200 mL/hr over 60 Minutes Intravenous Every 8 hours 02/12/22 1500 02/13/22 0947  ? 02/12/22 1300  cefTRIAXone (ROCEPHIN) 2 g in sodium chloride 0.9 % 100 mL IVPB  Status:  Discontinued       ? 2 g ?200 mL/hr over 30 Minutes Intravenous Every 12 hours 02/12/22 1208  02/13/22 0947  ? 02/12/22 1300  vancomycin (VANCOREADY) IVPB 2000 mg/400 mL       ? 2,000 mg ?200 mL/hr over 120 Minutes Intravenous  Once 02/12/22 1208 02/12/22 1915  ? 02/11/22 2200  artesunate 110 MG IV syringe 295.7 mg       ? 2.4 mg/kg ? 123.2 kg Intravenous Every 12 hours 02/11/22 1649 02/12/22 2209  ? 02/11/22 1700  artemether-lumefantrine (COARTEM) 20-120 MG tablet 4 tablet  Status:  Discontinued       ? 4 tablet Oral  Once 02/11/22 1646 02/11/22 1720  ? ?  ? ? ? ? ? ?Medications ? aspirin EC  81 mg Oral Daily  ? ? ? ? ?Subjective:  ? ?Maria Morgan was seen and examined today.  with female chaperone RN Georgina Peer, no significant events overnight, she denies any complaints. ? ?  Objective:  ? ?Vitals:  ? 02/15/22 2023 02/16/22 6887 02/16/22 0329 02/16/22 0816  ?BP: 111/67 116/72 130/83 128/80  ?Pulse: 81 79 80 80  ?Resp: 16 18 15 20   ?Temp: 98.8 ?F (37.1 ?C) 99 ?F (37.2 ?C) 99.1 ?F (37.3 ?C) 99.2 ?F (37.3 ?C)  ?TempSrc: Oral Oral Oral Oral  ?SpO2: 100% 99% 100% 99%  ?Weight:      ?Height:      ? ? ?Intake/Output Summary (Last 24 hours) at 02/16/2022 1109 ?Last data filed at 02/16/2022 0118 ?Gross per 24 hour  ?Intake 947.43 ml  ?Output --  ?Net 947.43 ml  ? ? ? ?Wt Readings from Last 3 Encounters:  ?02/11/22 123.2 kg  ?08/13/21 127.5 kg  ?09/01/20 119.7 kg  ? ? ? ?Exam ? ?Patient was seen and examined with a female chaperone at bedside , RN Georgina Peer ? ?Awake Alert, Oriented X 3, No new F.N deficits, Normal affect ?Symmetrical Chest wall movement, Good air movement bilaterally, CTAB ?RRR ?No Cyanosis, Clubbing or edema, No new Rash or bruise   ? ? ? ? ? ?Data Reviewed:  I have personally reviewed following labs  ? ? ?CBC ?Lab Results  ?Component Value Date  ? WBC 1.2 (LL) 02/16/2022  ? RBC 3.34 (L) 02/16/2022  ? HGB 10.1 (L) 02/16/2022  ? HCT 28.6 (L) 02/16/2022  ? MCV 85.6 02/16/2022  ? MCH 30.2 02/16/2022  ? PLT 142 (L) 02/16/2022  ? MCHC 35.3 02/16/2022  ? RDW 13.2 02/16/2022  ? LYMPHSABS 0.5 (L) 02/16/2022  ?  MONOABS 0.0 (L) 02/16/2022  ? EOSABS 0.0 02/16/2022  ? BASOSABS 0.0 02/16/2022  ? ? ? ?Last metabolic panel ?Lab Results  ?Component Value Date  ? NA 136 02/16/2022  ? K 3.9 02/16/2022  ? CL 108 02/16/2022  ? CO2 2

## 2022-02-16 NOTE — Progress Notes (Signed)
Laboratory called for critical result of Neutrophil 0.4. Messaged MD via amion. ?

## 2022-02-16 NOTE — Plan of Care (Signed)
?  Problem: Education: ?Goal: Knowledge of General Education information will improve ?Description: Including pain rating scale, medication(s)/side effects and non-pharmacologic comfort measures ?Outcome: Progressing ?  ?Problem: Clinical Measurements: ?Goal: Diagnostic test results will improve ?Outcome: Progressing ?  ?Problem: Nutrition: ?Goal: Adequate nutrition will be maintained ?Outcome: Progressing ?  ?Problem: Elimination: ?Goal: Will not experience complications related to bowel motility ?Outcome: Progressing ?  ?Problem: Safety: ?Goal: Ability to remain free from injury will improve ?Outcome: Progressing ?  ?Problem: Skin Integrity: ?Goal: Risk for impaired skin integrity will decrease ?Outcome: Progressing ?  ?

## 2022-02-17 DIAGNOSIS — R7401 Elevation of levels of liver transaminase levels: Secondary | ICD-10-CM | POA: Diagnosis not present

## 2022-02-17 LAB — CBC WITH DIFFERENTIAL/PLATELET
Abs Immature Granulocytes: 0.1 10*3/uL — ABNORMAL HIGH (ref 0.00–0.07)
Basophils Absolute: 0 10*3/uL (ref 0.0–0.1)
Basophils Relative: 0 %
Eosinophils Absolute: 0 10*3/uL (ref 0.0–0.5)
Eosinophils Relative: 0 %
HCT: 29 % — ABNORMAL LOW (ref 36.0–46.0)
Hemoglobin: 9.8 g/dL — ABNORMAL LOW (ref 12.0–15.0)
Lymphocytes Relative: 47 %
Lymphs Abs: 0.7 10*3/uL (ref 0.7–4.0)
MCH: 29.2 pg (ref 26.0–34.0)
MCHC: 33.8 g/dL (ref 30.0–36.0)
MCV: 86.3 fL (ref 80.0–100.0)
Metamyelocytes Relative: 5 %
Monocytes Absolute: 0.1 10*3/uL (ref 0.1–1.0)
Monocytes Relative: 5 %
Neutro Abs: 0.6 10*3/uL — ABNORMAL LOW (ref 1.7–7.7)
Neutrophils Relative %: 43 %
Platelets: 169 10*3/uL (ref 150–400)
RBC: 3.36 MIL/uL — ABNORMAL LOW (ref 3.87–5.11)
RDW: 13.2 % (ref 11.5–15.5)
WBC: 1.5 10*3/uL — ABNORMAL LOW (ref 4.0–10.5)
nRBC: 0 % (ref 0.0–0.2)
nRBC: 0 /100 WBC

## 2022-02-17 LAB — COMPREHENSIVE METABOLIC PANEL
ALT: 643 U/L — ABNORMAL HIGH (ref 0–44)
AST: 201 U/L — ABNORMAL HIGH (ref 15–41)
Albumin: 2.4 g/dL — ABNORMAL LOW (ref 3.5–5.0)
Alkaline Phosphatase: 82 U/L (ref 38–126)
Anion gap: 5 (ref 5–15)
BUN: <5 mg/dL — ABNORMAL LOW (ref 6–20)
CO2: 25 mmol/L (ref 22–32)
Calcium: 8.2 mg/dL — ABNORMAL LOW (ref 8.9–10.3)
Chloride: 109 mmol/L (ref 98–111)
Creatinine, Ser: 0.71 mg/dL (ref 0.44–1.00)
GFR, Estimated: >60 mL/min (ref >60)
Glucose, Bld: 106 mg/dL — ABNORMAL HIGH (ref 70–99)
Potassium: 3.7 mmol/L (ref 3.5–5.1)
Sodium: 139 mmol/L (ref 135–145)
Total Bilirubin: 1.1 mg/dL (ref 0.3–1.2)
Total Protein: 5.2 g/dL — ABNORMAL LOW (ref 6.5–8.1)

## 2022-02-17 LAB — IGG: IgG (Immunoglobin G), Serum: 1204 mg/dL (ref 586–1602)

## 2022-02-17 MED ORDER — METHOCARBAMOL 1000 MG/10ML IJ SOLN
500.0000 mg | Freq: Four times a day (QID) | INTRAVENOUS | Status: DC | PRN
  Filled 2022-02-17: qty 5

## 2022-02-17 MED ORDER — TRAMADOL HCL 50 MG PO TABS
50.0000 mg | ORAL_TABLET | Freq: Four times a day (QID) | ORAL | Status: DC | PRN
  Administered 2022-02-17: 50 mg via ORAL
  Filled 2022-02-17: qty 1

## 2022-02-17 MED ORDER — DICLOFENAC SODIUM 1 % EX GEL
4.0000 g | Freq: Four times a day (QID) | CUTANEOUS | Status: DC
  Administered 2022-02-17 (×2): 4 g via TOPICAL
  Filled 2022-02-17: qty 100

## 2022-02-17 MED ORDER — KETOROLAC TROMETHAMINE 15 MG/ML IJ SOLN
15.0000 mg | Freq: Once | INTRAMUSCULAR | Status: AC
  Administered 2022-02-17: 15 mg via INTRAVENOUS
  Filled 2022-02-17: qty 1

## 2022-02-17 MED ORDER — ASPIRIN 81 MG PO TBEC: 81.0000 mg | DELAYED_RELEASE_TABLET | Freq: Every day | ORAL | 11 refills | Status: DC

## 2022-02-17 NOTE — Discharge Summary (Signed)
Physician Discharge Summary  ?Maria Morgan QBV:[id number] DOB: August 23, 1991 DOA: 02/11/2022 ? ?PCP: Patient, No Pcp Per (Inactive) ? ?Admit date: 02/11/2022 ?Discharge date: 02/17/2022 ? ?Admitted From: Home ?Disposition:  Home  ? ?Recommendations for Outpatient Follow-up:  ?Follow up with PCP in 1-2 weeks ?Please obtain CMP/CBC in 3 DAYS ? ? ?Home Health:NO ? ? ?Discharge Condition:Stable ?CODE STATUS:FULL ?Diet recommendation:  Regular ? ?Brief/Interim Summary: ? ?Patient is a 31 year old female with a history of pituitary adenoma associated with hyperprolactinemia, familial multiple polyposis syndrome, class III obesity (BMI 41.30), ADHD, history of chlamydia and gonorrhea infections.  She presented to ED with fevers, body aches, fatigue, dizziness, vomiting/diarrhea since after she returned from a trip to [place].  Patient went to travel clinic and received all vaccinations but did not take her malaria prophylaxis while abroad.   ?In ED, vital signs stable.  WBC 1.8, platelets 91k, parasites on smear. K 3.4.  T. bili 1.4, remainder of LFTs normal.  Lipase normal.  COVID and flu negative.  UA with negative nitrite, negative leukocytes, and microscopy showing 0-5 WBCs and many bacteria.  ? Urine pregnancy test negative.  Chest x-ray not suggestive of pneumonia.  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  Patient was given IV artesunate, Imodium, Zofran, and 1 L normal saline bolus in the ED. ?  ?Patient states she recently traveled abroad to [place] and was there for about 3 weeks.  She received vaccinations prior to travel but did not take malaria prophylaxis.  She returned from her trip on April 22 and about 5 days prior to admission, started having symptoms including fevers, chills, body aches, fatigue, lightheadedness, headaches, nausea, generalized abdominal pain, and nonbloody diarrhea.  She has not been able to eat anything due to nausea  but has not vomited.  Also reports cough and shortness of breath, no chest pain.  ?Had an episodes of intermittent unresponsiveness for which she was transferred from [facility] to [facility] for continuous EEG.  There was no acute findings in her EEG, she did develop transaminitis, leukopenia, thrombocytopenia related to her malaria, which has been improving at time of discharge. ?  ?  ?  ?Severe sepsis secondary to suspected malaria ?-Patient met sepsis criteria with fevers, tachycardia, leukopenia, transaminitis, acute encephalopathy ?-COVID, flu negative.  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  \ ?-Antibiotics discontinued with no evidence of meningitis ?  ?Acute toxic encephalopathy with intermittent unresponsive episodes ?-CT head, CTA head and neck unremarkable ?-EEG normal finding, no epileptiform discharges ?-MRI showed 1 cm area of restricted diffusion in the splenium of corpus callosum.  Discussed with neurology, Dr. Leonel Ramsay, this is not a acute infarct and likely can be cytotoxic lesion associated with malaria.  Recommended continuous EEG as she is having intermittent spells and transfer to [facility]. ?-Neurology input greatly appreciated, EEG has been performed, no evidence of elliptiform discharges ?-Mentation back to baseline on discharge ?  ?Acute leukopenia, thrombocytopenia ?-Associated with acute malaria, ?-Platelet count started to improve, but white blood cell count remains low.  But it is trending up today. ?  ?Acute transaminitis ?-due to acute malaria, VS IV artesunate ,GI input greatly appreciated, LFTs trending down, CMP can be followed as an outpatient. ?-  normal right upper quadrant ultrasound ?-Negative hepatitis panel ?  ?   ?Hypokalemia ?-Likely due to diarrhea, replaced ?  ? Nonbloody diarrhea ?- Likely due to malaria.   ?- resolved ?  ?History of pituitary adenoma associated with hyperprolactinemia ?Not on any medications at this time. ?-Outpatient endocrinology follow-up. ?  ?Class III obesity (BMI 41.30) ?Estimated body mass index is 41.3 kg/m? as calculated from the following: ?  Height as of this encounter: 5' 8"  (1.727 m). ?  Weight as of this encounter: 123.2 kg. ?  ? ?Discharge Diagnoses:  ?Principal Problem: ?  Malaria ?Active Problems: ?  Pituitary adenoma ([place]) ?  Hypokalemia ?  Obesity, Class III, BMI 40-49.9 (morbid obesity) ([place]) ?  Acute encephalopathy ?  Severe sepsis ([place]) ?  Transaminitis ? ? ? ?Discharge Instructions ? ?Discharge Instructions   ? ? Diet - low sodium heart healthy   Complete by: As directed ?  ? Discharge instructions   Complete by: As directed ?  ? Follow with Primary MD Patient,  in 7 days  ? ?Get CBC, CMP,  checked  by Primary MD next visit.  ? ? ?Activity: As tolerated with Full fall precautions use walker/cane & assistance as needed ? ? ?Disposition Home  ? ? ?Diet: Regular diet  ? ? ? ?On your next visit with your primary care physician please Get Medicines reviewed and adjusted. ? ? ?Please request your Prim.MD to go over all Hospital Tests and Procedure/Radiological results at the follow up, please get all Hospital records sent to your Prim MD by signing hospital release before you go home. ? ? ?If you experience worsening of your admission symptoms, develop shortness of breath, life threatening emergency, suicidal or homicidal thoughts you must seek medical attention immediately by calling 911 or calling your MD immediately  if symptoms less severe. ? ?You Must read complete instructions/literature along with all the possible adverse reactions/side effects for all the Medicines you take and that have been prescribed to you. Take any new Medicines after you have completely understood and  accpet all the possible adverse reactions/side effects.  ? ?Do not drive, operating heavy machinery, perform activities at heights, swimming or participation in water activities or provide baby sitting services if your were admitted for syncope or siezures until you have seen by Primary MD or a Neurologist and advised to do so again. ? ?Do not drive when taking Pain medications.  ? ? ?Do not take more than prescribed Pain, Sleep and Anxiety Medications ? ?Special Instructions: If you have smoked or chewed Tobacco  in the last 2 yrs please stop smoking, stop any regular Alcohol  and or any Recreational drug use. ? ?Wear Seat belts while driving. ? ? ?Please note ? ?You were cared for by a hospitalist during your hospital stay. If you have any questions about your discharge medications or the care you received while you were in the hospital after you are discharged, you can call the unit and asked to speak with the hospitalist on call if the hospitalist that took care of you is not available. Once you are discharged, your primary care physician will handle any further medical issues. Please note that NO REFILLS for any discharge medications will be authorized once you are discharged, as it is imperative that you return to your primary care physician (or establish a relationship with a primary care physician if you do not have one) for your aftercare needs so that they can reassess your need for  medications and monitor your lab values.  ? Increase activity slowly   Complete by: As directed ?  ? ?  ? ?Allergies as of 02/17/2022   ? ?   Reactions  ? Iodine Shortness Of Breath, Itching  ? Contrast in IV  ? ?  ? ?  ?Medication List  ?  ? ?STOP taking these medications   ? ?ibuprofen 600 MG tablet ?Commonly known as: ADVIL ?  ? ?  ? ?TAKE these medications   ? ?aspirin 81 MG EC tablet ?Take 1 tablet (81 mg total) by mouth daily. Swallow whole. ?Start taking on: Feb 18, 9399 ?  ?folic acid 1 MG tablet ?Commonly known as:  FOLVITE ?Take 1 mg by mouth daily. ?  ? ?  ? ? Follow-up Information   ? ? [facility]             . Schedule an appointment as soon as possible for a visit in 2 days.   ?Contact informati

## 2022-02-17 NOTE — TOC Transition Note (Signed)
Transition of Care (TOC) - CM/SW Discharge Note ? ? ?Patient Details  ?Name: Makahla Kiser ?MRN: [id number] ?Date of Birth: 04/09/1991 ? ?Transition of Care (TOC) CM/SW Contact:  ?Carles Collet, RN ?Phone Number: ?02/17/2022, 11:06 AM ? ? ?Clinical Narrative:    ?Met with patient at bedside. She states that she is planning on DC today, this afternoon. We discussed PCP and added [facility] PCP number to AVS for her to call tomorrow to schedule appointment. In the meantime she will follow up with [facility] for acute needs. Patient has Pharmacist, community to cover prescriptions  ?No other TOC needs identified.  ? ? ? ?Final next level of care: Home/Self Care ?Barriers to Discharge: No Barriers Identified ? ? ?Patient Goals and CMS Choice ?  ?  ?  ? ?Discharge Placement ?  ?           ?  ?  ?  ?  ? ?Discharge Plan and Services ?  ?  ?           ?  ?  ?  ?  ?  ?  ?  ?  ?  ?  ? ?Social Determinants of Health (SDOH) Interventions ?  ? ? ?Readmission Risk Interventions ?   ? View : No data to display.  ?  ?  ?  ? ? ? ? ? ?

## 2022-02-18 ENCOUNTER — Telehealth: Payer: Self-pay

## 2022-02-18 DIAGNOSIS — R7401 Elevation of levels of liver transaminase levels: Secondary | ICD-10-CM

## 2022-02-18 DIAGNOSIS — B54 Unspecified malaria: Secondary | ICD-10-CM

## 2022-02-18 DIAGNOSIS — R7989 Other specified abnormal findings of blood chemistry: Secondary | ICD-10-CM

## 2022-02-18 LAB — HSV DNA BY PCR (REFERENCE LAB)
HSV 1 DNA: NEGATIVE
HSV 2 DNA: NEGATIVE

## 2022-02-18 LAB — PARASITE EXAM, BLOOD

## 2022-02-18 NOTE — Telephone Encounter (Signed)
Attempted to reach pt several times this morning. Her listed phone number goes straight to a fast busy signal. Pt also does not use MyChart. Will attempt to reach pt again at a different time. ? ?Lab order in epic.  ?

## 2022-02-18 NOTE — Telephone Encounter (Signed)
-----   Message from Yetta Flock, MD sent at 02/16/2022  2:10 PM EDT ----- ?Regarding: RE: labs ?Sorry! Yes LFTS. Thank you! ?----- Message ----- ?From: Yevette Edwards, RN ?Sent: 02/15/2022   3:15 PM EDT ?To: Yetta Flock, MD ?Subject: RE: labs                                      ? ?What labs would you like drawn? Repeat LFT's? ?----- Message ----- ?From: Yetta Flock, MD ?Sent: 02/15/2022   2:58 PM EDT ?To: Yevette Edwards, RN ?Subject: labs                                          ? ?Herbert Seta can you please help coordinate labs for this patient, perhaps Tuesday or so, at the office? Should be discharged this weekend. Thanks ? ? ? ?

## 2022-02-19 NOTE — Telephone Encounter (Signed)
Attempted to reach pt at the listed number and it goes straight to a fast busy signal.  ?I called patient's grandmother and she provided me with an alternate phone number, [phone]. I was able to leave a vm for the patient at this number.  ?

## 2022-02-21 NOTE — Telephone Encounter (Signed)
Spoke with patient to let her know that Dr. Havery Moros wanted to check liver labs at this time. No appointment is necessary. Patient is aware that she can stop by the lab in the basement at her convenience between 7:30 AM - 5 PM, Monday through Friday. Pt states that she tried to return my call but the wait time was over 40 minutes. Patient verbalized understanding and had no concerns at the end of the call.  ? ?

## 2022-02-22 ENCOUNTER — Inpatient Hospital Stay: Payer: 59 | Admitting: Internal Medicine

## 2022-02-22 ENCOUNTER — Other Ambulatory Visit (INDEPENDENT_AMBULATORY_CARE_PROVIDER_SITE_OTHER): Payer: 59

## 2022-02-22 DIAGNOSIS — R7401 Elevation of levels of liver transaminase levels: Secondary | ICD-10-CM

## 2022-02-22 DIAGNOSIS — R7989 Other specified abnormal findings of blood chemistry: Secondary | ICD-10-CM

## 2022-02-22 DIAGNOSIS — B54 Unspecified malaria: Secondary | ICD-10-CM | POA: Diagnosis not present

## 2022-02-22 LAB — HEPATIC FUNCTION PANEL
ALT: 114 U/L — ABNORMAL HIGH (ref 0–35)
AST: 27 U/L (ref 0–37)
Albumin: 3.8 g/dL (ref 3.5–5.2)
Alkaline Phosphatase: 63 U/L (ref 39–117)
Bilirubin, Direct: 0.2 mg/dL (ref 0.0–0.3)
Total Bilirubin: 0.7 mg/dL (ref 0.2–1.2)
Total Protein: 7.4 g/dL (ref 6.0–8.3)

## 2022-02-25 ENCOUNTER — Other Ambulatory Visit: Payer: Self-pay

## 2022-02-25 DIAGNOSIS — R7989 Other specified abnormal findings of blood chemistry: Secondary | ICD-10-CM

## 2022-02-26 LAB — PLASMODIUM SP. REFLEX: P. vivax: NEGATIVE

## 2022-02-26 LAB — PLASMODIUM SP. PCR: Plasmodium Sp. PCR: POSITIVE — AB

## 2022-03-05 ENCOUNTER — Other Ambulatory Visit: Payer: Self-pay

## 2022-03-05 ENCOUNTER — Ambulatory Visit (INDEPENDENT_AMBULATORY_CARE_PROVIDER_SITE_OTHER): Payer: 59 | Admitting: Internal Medicine

## 2022-03-05 ENCOUNTER — Encounter: Payer: Self-pay | Admitting: Internal Medicine

## 2022-03-05 VITALS — BP 127/87 | HR 90 | Resp 16 | Ht 68.0 in | Wt 268.0 lb

## 2022-03-05 DIAGNOSIS — R7401 Elevation of levels of liver transaminase levels: Secondary | ICD-10-CM | POA: Diagnosis not present

## 2022-03-05 DIAGNOSIS — D72819 Decreased white blood cell count, unspecified: Secondary | ICD-10-CM | POA: Diagnosis not present

## 2022-03-05 DIAGNOSIS — B54 Unspecified malaria: Secondary | ICD-10-CM

## 2022-03-05 NOTE — Progress Notes (Signed)
   Subjective:    Patient ID: Maria Morgan, female    DOB: 01-Nov-1990, 31 y.o.   MRN: [id number]  HPI Here for hsfu She was hospitalized earlier this month following recent travel to [place] and did not take her malaria prophylaxis.  She was admitted with n/v and fever and labs with leukopenia, thrombocycopenia and parasites.  She was started on IV artesunate and did become encephalopathic during her stay but slowly improved.  She had some elevated transaminases which resolved and continued with supportive care.  She continues to feel weak but improving.  Did see her PCP last week and her WBC was slowly climbing up to 2.2, Hgb stable at 10.7 and platelets normal.  PCR of the smear was positive for P falciparum.     Review of Systems  Constitutional:  Positive for fatigue. Negative for fever.  Gastrointestinal:  Negative for diarrhea and nausea.      Objective:   Physical Exam Eyes:     General: No scleral icterus. Skin:    Findings: No rash.  Neurological:     Mental Status: She is alert.  Psychiatric:        Mood and Affect: Mood normal.   SH: no tobacco       Assessment & Plan:

## 2022-03-05 NOTE — Assessment & Plan Note (Signed)
I discussed the results with her and reviewed the labs.  She did have P falciparum and I discussed the severity of this and her recovery.  I did emphasize that if she returns to [place], to be sure to have pre travel counseling here or with her provider at [facility].  She already has had yellow fever vaccine so she can come here if she wants.   Otherwise, she can return here as needed.

## 2022-03-05 NOTE — Assessment & Plan Note (Signed)
As a result of the malaria and slowly improving.  Up to 2.2 Can continue to monitor periodically.   Hgb also slowly improving.  Platelets now wnl.

## 2022-03-05 NOTE — Assessment & Plan Note (Signed)
Rechecked last week and remains wnl.  No further issues with AST and ALT.

## 2022-05-16 ENCOUNTER — Emergency Department (HOSPITAL_COMMUNITY): Payer: 59

## 2022-05-16 ENCOUNTER — Other Ambulatory Visit: Payer: Self-pay

## 2022-05-16 ENCOUNTER — Emergency Department (HOSPITAL_COMMUNITY)
Admission: EM | Admit: 2022-05-16 | Discharge: 2022-05-17 | Disposition: A | Discharge status: Home / Self Care | Payer: 59 | Attending: Emergency Medicine | Admitting: Emergency Medicine

## 2022-05-16 ENCOUNTER — Encounter (HOSPITAL_COMMUNITY): Payer: Self-pay

## 2022-05-16 DIAGNOSIS — R519 Headache, unspecified: Secondary | ICD-10-CM | POA: Insufficient documentation

## 2022-05-16 DIAGNOSIS — R55 Syncope and collapse: Secondary | ICD-10-CM | POA: Insufficient documentation

## 2022-05-16 HISTORY — DX: Unspecified malaria: B54

## 2022-05-16 LAB — URINALYSIS, ROUTINE W REFLEX MICROSCOPIC
Bilirubin Urine: NEGATIVE
Glucose, UA: NEGATIVE mg/dL
Hgb urine dipstick: NEGATIVE
Ketones, ur: NEGATIVE mg/dL
Leukocytes,Ua: NEGATIVE
Nitrite: NEGATIVE
Protein, ur: NEGATIVE mg/dL
Specific Gravity, Urine: 1.006 (ref 1.005–1.030)
pH: 7 (ref 5.0–8.0)

## 2022-05-16 LAB — CBC WITH DIFFERENTIAL/PLATELET
Abs Immature Granulocytes: 0.01 10*3/uL (ref 0.00–0.07)
Basophils Absolute: 0 10*3/uL (ref 0.0–0.1)
Basophils Relative: 0 %
Eosinophils Absolute: 0.1 10*3/uL (ref 0.0–0.5)
Eosinophils Relative: 3 %
HCT: 38.3 % (ref 36.0–46.0)
Hemoglobin: 12.9 g/dL (ref 12.0–15.0)
Immature Granulocytes: 0 %
Lymphocytes Relative: 35 %
Lymphs Abs: 1.6 10*3/uL (ref 0.7–4.0)
MCH: 29.9 pg (ref 26.0–34.0)
MCHC: 33.7 g/dL (ref 30.0–36.0)
MCV: 88.7 fL (ref 80.0–100.0)
Monocytes Absolute: 0.4 10*3/uL (ref 0.1–1.0)
Monocytes Relative: 9 %
Neutro Abs: 2.5 10*3/uL (ref 1.7–7.7)
Neutrophils Relative %: 53 %
Platelets: 262 10*3/uL (ref 150–400)
RBC: 4.32 MIL/uL (ref 3.87–5.11)
RDW: 12.8 % (ref 11.5–15.5)
WBC: 4.6 10*3/uL (ref 4.0–10.5)
nRBC: 0 % (ref 0.0–0.2)

## 2022-05-16 LAB — COMPREHENSIVE METABOLIC PANEL
ALT: 32 U/L (ref 0–44)
AST: 24 U/L (ref 15–41)
Albumin: 3.9 g/dL (ref 3.5–5.0)
Alkaline Phosphatase: 44 U/L (ref 38–126)
Anion gap: 5 (ref 5–15)
BUN: 9 mg/dL (ref 6–20)
CO2: 27 mmol/L (ref 22–32)
Calcium: 9.3 mg/dL (ref 8.9–10.3)
Chloride: 105 mmol/L (ref 98–111)
Creatinine, Ser: 0.76 mg/dL (ref 0.44–1.00)
GFR, Estimated: >60 mL/min (ref >60)
Glucose, Bld: 80 mg/dL (ref 70–99)
Potassium: 3.7 mmol/L (ref 3.5–5.1)
Sodium: 137 mmol/L (ref 135–145)
Total Bilirubin: 0.4 mg/dL (ref 0.3–1.2)
Total Protein: 7.5 g/dL (ref 6.5–8.1)

## 2022-05-16 LAB — TROPONIN I (HIGH SENSITIVITY)
Troponin I (High Sensitivity): <2 ng/L (ref <18)
Troponin I (High Sensitivity): <2 ng/L (ref <18)

## 2022-05-16 LAB — I-STAT BETA HCG BLOOD, ED (MC, WL, AP ONLY): I-stat hCG, quantitative: <5 m[IU]/mL (ref <5)

## 2022-05-16 NOTE — ED Notes (Signed)
Patient has a urine culture in the main lab 

## 2022-05-16 NOTE — ED Provider Triage Note (Signed)
Emergency Medicine Provider Triage Evaluation Note  Maria Morgan , a 31 y.o. female  was evaluated in triage.  Pt complains of syncope.  She recently went back to work after being taken out of work for malaria.  She states that she frequently gets severe headaches, feels lightheaded, clammy, and then passes out.   Physical Exam  BP (!) 140/86 (BP Location: Right Arm)   Pulse 82   Temp 98 F (36.7 C) (Oral)   Resp 18   SpO2 99%  Gen:   Awake, no distress   Resp:  Normal effort  MSK:   Moves extremities without difficulty  Other:  Normal speech  Medical Decision Making  Medically screening exam initiated at 6:12 PM.  Appropriate orders placed.  [name] was informed that the remainder of the evaluation will be completed by another provider, this initial triage assessment does not replace that evaluation, and the importance of remaining in the ED until their evaluation is complete.     Lorin Glass, [place] 05/16/22 1818

## 2022-05-17 NOTE — Discharge Instructions (Signed)
You were evaluated in the Emergency Department and after careful evaluation, we did not find any emergent condition requiring admission or further testing in the hospital.  Your exam/testing today was overall reassuring.  Symptoms may be due to dehydration.  Recommend keeping your follow-up tomorrow and discussing your symptoms.  Recommend working on your constipation as we discussed with MiraLAX.  Please return to the Emergency Department if you experience any worsening of your condition.  Thank you for allowing [place] to be a part of your care.

## 2022-05-17 NOTE — ED Provider Notes (Signed)
[facility] Emergency Department Provider Note MRN:  [id number]  Arrival date & time: 05/17/22     Chief Complaint   Loss of Consciousness   History of Present Illness   Maria Morgan is a 31 y.o. year-old female with a history of malaria presenting to the ED with chief complaint of loss of consciousness.  Patient experienced malaria after visiting [place] earlier this year.  She was treated and has been following up with infectious disease.  She has felt generally not quite back to her normal since this infection.  She denies any continued fever or body aches.  Today she stood up and was feeling lightheaded, she then walked outside trying to go to work in the bright light bothered her, causing a mild headache.  This is not uncommon for her.  She then had a passout spell shortly after.  Hit her head, denies any other injuries or complaints.  Is also not been having normal bowel movements, feels constipated.  No numbness or weakness to the arms or legs, no chest pain or shortness of breath.  Review of Systems  A thorough review of systems was obtained and all systems are negative except as noted in the HPI and PMH.   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[place])    Chlamydia    Gonorrhea    Malaria     Past Surgical History:  Procedure Laterality Date   NO PAST SURGERIES      Family History  Problem Relation Age of Onset   Cancer Mother    Diabetes Paternal Grandfather     Social History   Socioeconomic History   Marital status: Single    Spouse name: Not on file   Number of children: Not on file   Years of education: Not on file   Highest education level: Not on file  Occupational History   Not on file  Tobacco Use   Smoking status: Never   Smokeless tobacco: Never  Vaping Use   Vaping Use: Never used  Substance and Sexual Activity   Alcohol use: Yes    Comment: occasional   Drug use: No    Sexual activity: Yes    Birth control/protection: None  Other Topics Concern   Not on file  Social History Narrative   Not on file   Social Determinants of Health   Financial Resource Strain: Not on file  Food Insecurity: Not on file  Transportation Needs: Not on file  Physical Activity: Not on file  Stress: Not on file  Social Connections: Not on file  Intimate Partner Violence: Not on file     Physical Exam   Vitals:   05/16/22 2140 05/16/22 2330  BP: (!) 138/94 (!) 142/78  Pulse: 69 67  Resp: 18 17  Temp: 98 F (36.7 C)   SpO2: 96% 100%    CONSTITUTIONAL: Well-appearing, NAD NEURO/PSYCH:  Alert and oriented x 3, normal and symmetric strength and sensation, normal coordination, normal speech EYES:  eyes equal and reactive ENT/NECK:  no LAD, no JVD CARDIO: Regular rate, well-perfused, normal S1 and S2 PULM:  CTAB no wheezing or rhonchi GI/GU:  non-distended, non-tender MSK/SPINE:  No gross deformities, no edema SKIN:  no rash, atraumatic   *Additional and/or pertinent findings included in MDM below  Diagnostic and Interventional Summary    EKG Interpretation  Date/Time:  Thursday May 16 2022 18:08:36 EDT Ventricular Rate:  85 PR  Interval:  163 QRS Duration: 89 QT Interval:  347 QTC Calculation: 413 R Axis:   45 Text Interpretation: Sinus rhythm Baseline wander in lead(s) V3 Confirmed by Gerlene Fee [phone]) on 05/16/2022 11:52:10 PM       Labs Reviewed  URINALYSIS, ROUTINE W REFLEX MICROSCOPIC - Abnormal; Notable for the following components:      Result Value   Color, Urine STRAW (*)    All other components within normal limits  COMPREHENSIVE METABOLIC PANEL  CBC WITH DIFFERENTIAL/PLATELET  I-STAT BETA HCG BLOOD, ED ([facility], [facility], [facility] ONLY)  TROPONIN I (HIGH SENSITIVITY)  TROPONIN I (HIGH SENSITIVITY)    CT HEAD WO CONTRAST (5MM)  Final Result      Medications - No data to display   Procedures  /  Critical Care Procedures  ED Course and  Medical Decision Making  Initial Impression and Ddx Overall favoring a benign syncopal event.  Potentially related to dehydration or orthostasis, potentially a headache contributing to the feeling of general malaise.  No fever, reassuring vital signs, doubt any continued malarial symptoms.  Past medical/surgical history that increases complexity of ED encounter: History of malaria few months ago  Interpretation of Diagnostics I personally reviewed the EKG and my interpretation is as follows: Sinus rhythm, normal intervals, no signs of [place] or long QT or ischemic findings  Labs are reassuring and no significant blood count or electrolyte disturbance, troponin is negative.  CT head is normal  Patient Reassessment and Ultimate Disposition/Management     Work-up reassuring and so patient is appropriate for discharge, has close follow-up tomorrow.  Patient management required discussion with the following services or consulting groups:  None  Complexity of Problems Addressed Acute illness or injury that poses threat of life of bodily function  Additional Data Reviewed and Analyzed Further history obtained from: Recent Consult notes  Additional Factors Impacting ED Encounter Risk None  Barth Kirks. Sedonia Small, [place] [email or web address]  Final Clinical Impressions(s) / ED Diagnoses     ICD-10-CM   1. Syncope, unspecified syncope type  R55       ED Discharge Orders     None        Discharge Instructions Discussed with and Provided to Patient:    Discharge Instructions      You were evaluated in the Emergency Department and after careful evaluation, we did not find any emergent condition requiring admission or further testing in the hospital.  Your exam/testing today was overall reassuring.  Symptoms may be due to dehydration.  Recommend keeping your follow-up tomorrow and discussing your symptoms.  Recommend working on  your constipation as we discussed with MiraLAX.  Please return to the Emergency Department if you experience any worsening of your condition.  Thank you for allowing [place] to be a part of your care.       Maudie Flakes, MD 05/17/22 [phone]

## 2022-07-18 ENCOUNTER — Other Ambulatory Visit: Payer: Self-pay

## 2022-07-18 ENCOUNTER — Encounter (HOSPITAL_COMMUNITY): Payer: Self-pay | Admitting: Emergency Medicine

## 2022-07-18 ENCOUNTER — Emergency Department (HOSPITAL_COMMUNITY)
Admission: EM | Admit: 2022-07-18 | Discharge: 2022-07-18 | Disposition: A | Discharge status: Home / Self Care | Payer: 59 | Attending: Emergency Medicine | Admitting: Emergency Medicine

## 2022-07-18 DIAGNOSIS — R519 Headache, unspecified: Secondary | ICD-10-CM | POA: Insufficient documentation

## 2022-07-18 DIAGNOSIS — Y990 Civilian activity done for income or pay: Secondary | ICD-10-CM | POA: Insufficient documentation

## 2022-07-18 DIAGNOSIS — Z85841 Personal history of malignant neoplasm of brain: Secondary | ICD-10-CM | POA: Diagnosis not present

## 2022-07-18 DIAGNOSIS — Z7982 Long term (current) use of aspirin: Secondary | ICD-10-CM | POA: Diagnosis not present

## 2022-07-18 DIAGNOSIS — R55 Syncope and collapse: Secondary | ICD-10-CM | POA: Diagnosis present

## 2022-07-18 LAB — BASIC METABOLIC PANEL
Anion gap: 5 (ref 5–15)
BUN: 7 mg/dL (ref 6–20)
CO2: 27 mmol/L (ref 22–32)
Calcium: 9 mg/dL (ref 8.9–10.3)
Chloride: 106 mmol/L (ref 98–111)
Creatinine, Ser: 0.85 mg/dL (ref 0.44–1.00)
GFR, Estimated: >60 mL/min (ref >60)
Glucose, Bld: 82 mg/dL (ref 70–99)
Potassium: 3.7 mmol/L (ref 3.5–5.1)
Sodium: 138 mmol/L (ref 135–145)

## 2022-07-18 LAB — I-STAT BETA HCG BLOOD, ED (MC, WL, AP ONLY): I-stat hCG, quantitative: <5 m[IU]/mL (ref <5)

## 2022-07-18 LAB — CBC
HCT: 40 % (ref 36.0–46.0)
Hemoglobin: 13.7 g/dL (ref 12.0–15.0)
MCH: 30 pg (ref 26.0–34.0)
MCHC: 34.3 g/dL (ref 30.0–36.0)
MCV: 87.5 fL (ref 80.0–100.0)
Platelets: 226 10*3/uL (ref 150–400)
RBC: 4.57 MIL/uL (ref 3.87–5.11)
RDW: 12 % (ref 11.5–15.5)
WBC: 3.8 10*3/uL — ABNORMAL LOW (ref 4.0–10.5)
nRBC: 0 % (ref 0.0–0.2)

## 2022-07-18 LAB — CBG MONITORING, ED: Glucose-Capillary: 77 mg/dL (ref 70–99)

## 2022-07-18 MED ORDER — PROCHLORPERAZINE EDISYLATE 10 MG/2ML IJ SOLN
10.0000 mg | Freq: Once | INTRAMUSCULAR | Status: AC
  Administered 2022-07-18: 10 mg via INTRAVENOUS
  Filled 2022-07-18: qty 2

## 2022-07-18 MED ORDER — SODIUM CHLORIDE 0.9 % IV BOLUS
500.0000 mL | Freq: Once | INTRAVENOUS | Status: AC
  Administered 2022-07-18: 500 mL via INTRAVENOUS

## 2022-07-18 MED ORDER — KETOROLAC TROMETHAMINE 15 MG/ML IJ SOLN
15.0000 mg | Freq: Once | INTRAMUSCULAR | Status: AC
  Administered 2022-07-18: 15 mg via INTRAVENOUS
  Filled 2022-07-18: qty 1

## 2022-07-18 MED ORDER — DIPHENHYDRAMINE HCL 50 MG/ML IJ SOLN
12.5000 mg | Freq: Once | INTRAMUSCULAR | Status: AC
  Administered 2022-07-18: 12.5 mg via INTRAVENOUS
  Filled 2022-07-18: qty 1

## 2022-07-18 NOTE — Discharge Instructions (Addendum)
You were seen in the emergency department today after syncopal episode.  As we discussed your lab work and EKG look normal today.  It is incredibly important you follow-up with the cardiologist and the neurologist as your PCP recommended.   Continue to monitor how you're doing and return to the ER for new or worsening symptoms.

## 2022-07-18 NOTE — ED Triage Notes (Signed)
Pt BIB [facility] to [facility]. She has dizziness with syncopy episode. EMS reported this was witness while pt was at work. This is the 3th to 4th in the past month.

## 2022-07-18 NOTE — ED Provider Notes (Addendum)
[facility] EMERGENCY DEPARTMENT Provider Note   CSN: [id number] Arrival date & time: 07/18/22  1211     History  Chief Complaint  Patient presents with   Near Syncope    Maria Morgan is a 31 y.o. female with history of benign brain tumor, malaria (followed by ID), and numerous syncopal episodes who presents to the emergency department for a syncopal episode while at work. Patient works as a [employer] fundraiser and states that she had mentioned to her boss that she was feeling dizzy and needed to sit down.  They continue to talk to her, and she felt herself falling to the ground.  She states that she struck the front of her forehead on a metal piece by a door and landed on the ground.  Felt her heart racing before she passed out, but did not experience any chest pain.  Now is complaining of a headache with some sensitivity to light, which has been ongoing for her.   Near Syncope Associated symptoms include headaches. Pertinent negatives include no chest pain and no shortness of breath.       Home Medications Prior to Admission medications   Medication Sig Start Date End Date Taking? Authorizing Provider  aspirin EC 81 MG EC tablet Take 1 tablet (81 mg total) by mouth daily. Swallow whole. 02/18/22   Elgergawy, Silver Huguenin, MD  folic acid (FOLVITE) 1 MG tablet Take 1 mg by mouth daily.    [provider]  valACYclovir (VALTREX) 1000 MG tablet SMARTSIG:1 Tablet(s) By Mouth Every 12 Hours 02/25/22   [provider]      Allergies    Iodine    Review of Systems   Review of Systems  Eyes:  Positive for photophobia.  Respiratory:  Negative for shortness of breath.   Cardiovascular:  Positive for near-syncope. Negative for chest pain, palpitations and leg swelling.  Gastrointestinal:  Negative for nausea and vomiting.  Neurological:  Positive for syncope and headaches.  All other systems reviewed and are negative.   Physical Exam Updated Vital  Signs BP 131/71   Pulse 82   Temp 98.5 F (36.9 C) (Oral)   Resp 19   Ht '5\' 8"'$  (1.727 m)   Wt 127.5 kg   LMP 06/28/2022   SpO2 100%   BMI 42.74 kg/m  Physical Exam Vitals and nursing note reviewed.  Constitutional:      Appearance: Normal appearance.  HENT:     Head: Normocephalic and atraumatic. No raccoon eyes, abrasion, contusion or laceration.  Eyes:     Conjunctiva/sclera: Conjunctivae normal.  Cardiovascular:     Rate and Rhythm: Normal rate and regular rhythm.  Pulmonary:     Effort: Pulmonary effort is normal. No respiratory distress.     Breath sounds: Normal breath sounds.  Abdominal:     General: There is no distension.     Palpations: Abdomen is soft.     Tenderness: There is no abdominal tenderness.  Skin:    General: Skin is warm and dry.  Neurological:     General: No focal deficit present.     Mental Status: She is alert.     ED Results / Procedures / Treatments   Labs (all labs ordered are listed, but only abnormal results are displayed) Labs Reviewed  CBC - Abnormal; Notable for the following components:      Result Value   WBC 3.8 (*)    All other components within normal limits  BASIC METABOLIC PANEL  CBG MONITORING, ED  I-STAT BETA HCG BLOOD, ED ([facility], [facility], [facility] ONLY)    EKG None  Radiology No results found.  Procedures Procedures    Medications Ordered in ED Medications  sodium chloride 0.9 % bolus 500 mL (500 mLs Intravenous New Bag/Given 07/18/22 1413)  ketorolac (TORADOL) 15 MG/ML injection 15 mg (15 mg Intravenous Given 07/18/22 1356)  diphenhydrAMINE (BENADRYL) injection 12.5 mg (12.5 mg Intravenous Given 07/18/22 1401)  prochlorperazine (COMPAZINE) injection 10 mg (10 mg Intravenous Given 07/18/22 1359)    ED Course/ Medical Decision Making/ A&P                           Medical Decision Making Amount and/or Complexity of Data Reviewed Labs: ordered.  Risk Prescription drug management.   This patient is a 31 y.o. female   who presents to the ED for concern of syncopal episode at work.   Differential diagnoses prior to evaluation: The emergent differential diagnosis includes, but is not limited to,  CVA, ACS, arrhythmia, vasovagal syncope, orthostatic hypotension, sepsis, hypoglycemia, electrolyte disturbance, respiratory failure, symptomatic anemia, dehydration, heat injury, polypharmacy, malignancy, anxiety/panic attack. This is not an exhaustive differential.   Past Medical History / Co-morbidities: benign brain tumor, malaria (followed by ID), and numerous syncopal episodes  Additional history: Chart reviewed. Pertinent results include: Seen on 10/2 by PCP for syncopal episodes, referrals to neurology and cardiology sent. Last seen in ER for syncopal episode on 8/3. Hx of malaria after traveling to [place], was hospitalized for encephalopathy. Still struggling with fatigue, dizziness, and sensitivity to light.   Physical Exam: Physical exam performed. The pertinent findings include: Normal vital signs. No lacerations, contusions, or deformities noted to the forehead where patient reports striking metal while falling.   Lab Tests/Imaging studies: I personally interpreted labs/imaging and the pertinent results include:  CBC and BMP unremarkable.   Cardiac monitoring: EKG obtained and interpreted by my attending physician which shows: sinus rhythm   Medications: I ordered medication including migraine cocktail.  I have reviewed the patients home medicines and have made adjustments as needed.   Disposition: After consideration of the diagnostic results and the patients response to treatment, I feel that emergency department workup does not suggest an emergent condition requiring admission or immediate intervention beyond what has been performed at this time. The plan is: discharge to home with symptomatic management of headache and recommend PCP, cardiology and neurology follow ups as referred. Most likely  benign syncopal episode. The patient is safe for discharge and has been instructed to return immediately for worsening symptoms, change in symptoms or any other concerns.   Final Clinical Impression(s) / ED Diagnoses Final diagnoses:  Syncope, unspecified syncope type  Acute nonintractable headache, unspecified headache type    Rx / DC Orders ED Discharge Orders     None      Portions of this report may have been transcribed using voice recognition software. Every effort was made to ensure accuracy; however, inadvertent computerized transcription errors may be present.    Kateri Plummer, PA-C 07/18/22 1511    Lennice Sites, DO 07/18/22 1517

## 2022-08-06 ENCOUNTER — Ambulatory Visit: Payer: 59 | Admitting: Internal Medicine

## 2022-08-14 ENCOUNTER — Ambulatory Visit: Payer: 59 | Admitting: Internal Medicine

## 2022-08-16 ENCOUNTER — Other Ambulatory Visit: Payer: Self-pay

## 2022-08-16 ENCOUNTER — Ambulatory Visit (INDEPENDENT_AMBULATORY_CARE_PROVIDER_SITE_OTHER): Payer: 59 | Admitting: Internal Medicine

## 2022-08-16 ENCOUNTER — Encounter: Payer: Self-pay | Admitting: Internal Medicine

## 2022-08-16 VITALS — BP 142/82 | HR 91 | Resp 16 | Ht 66.0 in | Wt 298.0 lb

## 2022-08-16 DIAGNOSIS — B54 Unspecified malaria: Secondary | ICD-10-CM

## 2022-08-16 NOTE — Progress Notes (Signed)
   Subjective:    Patient ID: Maria Morgan, female    DOB: 1991/05/19, 31 y.o.   MRN: [id number]  HPI Maria Morgan is her for a work in visit with concern for recent episodes of syncope and presyncope and her history of malaria. She had malaria last spring after a trip to [place] and was positive for P falciparum, negative for others.  She had mild anemia, thrombocytopenia and leukopenia.  She completed IV artesunate followed by Co-[facility] and did well.  She is concerned for lingering effects as the cause.     Review of Systems  Constitutional:  Negative for chills, fatigue and fever.  Skin:  Negative for rash.       Objective:   Physical Exam Eyes:     General: No scleral icterus. Pulmonary:     Effort: Pulmonary effort is normal.  Neurological:     Mental Status: She is alert.           Assessment & Plan:

## 2022-08-16 NOTE — Assessment & Plan Note (Addendum)
This has been adequately treated and recent labs last month with a normal Hgb and platelets.  There is no indication for any lingering effects from malaria with normalization of her blood counts and P falciparum is not a relapsing infection so no further concerns for ongoing infection I advised she consider evaluation by cardiology.  She thinks she was referred but did not make the appt  I have personally spent 25 minutes involved in face-to-face and non-face-to-face activities for this patient on the day of the visit. Professional time spent includes the following activities: Preparing to see the patient (review of tests), Obtaining and/or reviewing separately obtained history (admission/discharge record), Performing a medically appropriate examination and/or evaluation , Ordering medications/tests/procedures, referring and communicating with other health care professionals, Documenting clinical information in the EMR, Independently interpreting results (not separately reported), Communicating results to the patient/family/caregiver, Counseling and educating the patient/family/caregiver and Care coordination (not separately reported).

## 2023-04-08 ENCOUNTER — Emergency Department (HOSPITAL_BASED_OUTPATIENT_CLINIC_OR_DEPARTMENT_OTHER)
Admission: EM | Admit: 2023-04-08 | Discharge: 2023-04-08 | Disposition: A | Discharge status: Home / Self Care | Payer: 59 | Attending: Emergency Medicine | Admitting: Emergency Medicine

## 2023-04-08 ENCOUNTER — Emergency Department (HOSPITAL_BASED_OUTPATIENT_CLINIC_OR_DEPARTMENT_OTHER): Payer: 59

## 2023-04-08 ENCOUNTER — Encounter (HOSPITAL_BASED_OUTPATIENT_CLINIC_OR_DEPARTMENT_OTHER): Payer: Self-pay

## 2023-04-08 ENCOUNTER — Other Ambulatory Visit: Payer: Self-pay

## 2023-04-08 DIAGNOSIS — Z86011 Personal history of benign neoplasm of the brain: Secondary | ICD-10-CM | POA: Insufficient documentation

## 2023-04-08 DIAGNOSIS — R55 Syncope and collapse: Secondary | ICD-10-CM

## 2023-04-08 DIAGNOSIS — O219 Vomiting of pregnancy, unspecified: Secondary | ICD-10-CM | POA: Diagnosis not present

## 2023-04-08 DIAGNOSIS — E860 Dehydration: Secondary | ICD-10-CM | POA: Diagnosis not present

## 2023-04-08 DIAGNOSIS — O99281 Endocrine, nutritional and metabolic diseases complicating pregnancy, first trimester: Secondary | ICD-10-CM | POA: Insufficient documentation

## 2023-04-08 DIAGNOSIS — Z3A00 Weeks of gestation of pregnancy not specified: Secondary | ICD-10-CM | POA: Diagnosis not present

## 2023-04-08 DIAGNOSIS — O3680X0 Pregnancy with inconclusive fetal viability, not applicable or unspecified: Secondary | ICD-10-CM | POA: Insufficient documentation

## 2023-04-08 DIAGNOSIS — Z7982 Long term (current) use of aspirin: Secondary | ICD-10-CM | POA: Diagnosis not present

## 2023-04-08 DIAGNOSIS — Z1152 Encounter for screening for COVID-19: Secondary | ICD-10-CM | POA: Diagnosis not present

## 2023-04-08 DIAGNOSIS — E871 Hypo-osmolality and hyponatremia: Secondary | ICD-10-CM | POA: Insufficient documentation

## 2023-04-08 DIAGNOSIS — O26891 Other specified pregnancy related conditions, first trimester: Secondary | ICD-10-CM | POA: Diagnosis present

## 2023-04-08 LAB — CBC WITH DIFFERENTIAL/PLATELET
Abs Immature Granulocytes: 0.01 10*3/uL (ref 0.00–0.07)
Basophils Absolute: 0 10*3/uL (ref 0.0–0.1)
Basophils Relative: 0 %
Eosinophils Absolute: 0.1 10*3/uL (ref 0.0–0.5)
Eosinophils Relative: 2 %
HCT: 36.8 % (ref 36.0–46.0)
Hemoglobin: 12.5 g/dL (ref 12.0–15.0)
Immature Granulocytes: 0 %
Lymphocytes Relative: 33 %
Lymphs Abs: 1.4 10*3/uL (ref 0.7–4.0)
MCH: 29.6 pg (ref 26.0–34.0)
MCHC: 34 g/dL (ref 30.0–36.0)
MCV: 87 fL (ref 80.0–100.0)
Monocytes Absolute: 0.4 10*3/uL (ref 0.1–1.0)
Monocytes Relative: 11 %
Neutro Abs: 2.2 10*3/uL (ref 1.7–7.7)
Neutrophils Relative %: 54 %
Platelets: 228 10*3/uL (ref 150–400)
RBC: 4.23 MIL/uL (ref 3.87–5.11)
RDW: 12 % (ref 11.5–15.5)
WBC: 4.1 10*3/uL (ref 4.0–10.5)
nRBC: 0 % (ref 0.0–0.2)

## 2023-04-08 LAB — RESP PANEL BY RT-PCR (RSV, FLU A&B, COVID)  RVPGX2
Influenza A by PCR: NEGATIVE
Influenza B by PCR: NEGATIVE
Resp Syncytial Virus by PCR: NEGATIVE
SARS Coronavirus 2 by RT PCR: NEGATIVE

## 2023-04-08 LAB — BASIC METABOLIC PANEL
Anion gap: 7 (ref 5–15)
BUN: 10 mg/dL (ref 6–20)
CO2: 20 mmol/L — ABNORMAL LOW (ref 22–32)
Calcium: 8.9 mg/dL (ref 8.9–10.3)
Chloride: 104 mmol/L (ref 98–111)
Creatinine, Ser: 0.8 mg/dL (ref 0.44–1.00)
GFR, Estimated: >60 mL/min (ref >60)
Glucose, Bld: 100 mg/dL — ABNORMAL HIGH (ref 70–99)
Potassium: 3.7 mmol/L (ref 3.5–5.1)
Sodium: 131 mmol/L — ABNORMAL LOW (ref 135–145)

## 2023-04-08 LAB — URINALYSIS, ROUTINE W REFLEX MICROSCOPIC
Bilirubin Urine: NEGATIVE
Glucose, UA: NEGATIVE mg/dL
Hgb urine dipstick: NEGATIVE
Ketones, ur: NEGATIVE mg/dL
Leukocytes,Ua: NEGATIVE
Nitrite: NEGATIVE
Protein, ur: NEGATIVE mg/dL
Specific Gravity, Urine: 1.015 (ref 1.005–1.030)
pH: 5.5 (ref 5.0–8.0)

## 2023-04-08 LAB — PARASITE EXAM SCREEN, BLOOD-W CONF TO LABCORP (NOT @ ARMC)

## 2023-04-08 LAB — D-DIMER, QUANTITATIVE: D-Dimer, Quant: 0.37 ug{FEU}/mL (ref 0.00–0.50)

## 2023-04-08 LAB — HCG, SERUM, QUALITATIVE: Preg, Serum: POSITIVE — AB

## 2023-04-08 LAB — RAPID HIV SCREEN (HIV 1/2 AB+AG)
HIV 1/2 Antibodies: NONREACTIVE
HIV-1 P24 Antigen - HIV24: NONREACTIVE

## 2023-04-08 LAB — MAGNESIUM: Magnesium: 1.8 mg/dL (ref 1.7–2.4)

## 2023-04-08 LAB — HCG, QUANTITATIVE, PREGNANCY: hCG, Beta Chain, Quant, S: 290 m[IU]/mL — ABNORMAL HIGH

## 2023-04-08 LAB — CBG MONITORING, ED: Glucose-Capillary: 105 mg/dL — ABNORMAL HIGH (ref 70–99)

## 2023-04-08 MED ORDER — DOXYLAMINE SUCCINATE (SLEEP) 25 MG PO TABS: 12.5000 mg | ORAL_TABLET | Freq: Three times a day (TID) | ORAL | 0 refills | Status: DC | PRN

## 2023-04-08 MED ORDER — SODIUM CHLORIDE 0.9 % IV BOLUS
500.0000 mL | Freq: Once | INTRAVENOUS | Status: AC
  Administered 2023-04-08: 500 mL via INTRAVENOUS

## 2023-04-08 MED ORDER — PYRIDOXINE HCL 100 MG/ML IJ SOLN
30.0000 mg | Freq: Once | INTRAMUSCULAR | Status: AC
  Administered 2023-04-08: 30 mg via INTRAVENOUS
  Filled 2023-04-08: qty 1

## 2023-04-08 MED ORDER — LACTATED RINGERS IV BOLUS
1000.0000 mL | Freq: Once | INTRAVENOUS | Status: AC
  Administered 2023-04-08: 1000 mL via INTRAVENOUS

## 2023-04-08 MED ORDER — DOXYLAMINE SUCCINATE (SLEEP) 25 MG PO TABS: 12.5000 mg | ORAL_TABLET | Freq: Once | ORAL | Status: DC

## 2023-04-08 MED ORDER — VITAMIN B-6 25 MG PO TABS: 25.0000 mg | ORAL_TABLET | Freq: Three times a day (TID) | ORAL | 0 refills | Status: AC | PRN

## 2023-04-08 NOTE — Discharge Instructions (Addendum)
Thank you for letting us take care of you today.  Your pregnancy test was positive.  Your blood work showed a pregnancy hormone level of 290. The ultrasound did not show a pregnancy in the uterus. It did not show any complications either. It is hard to say if pregnancy is in the right location or if this is a very early pregnancy which we cannot visualize on ultrasound yet. Return for repeat pregnancy hormone and ultrasound in 1 week or sooner for any complications such as fever, abdominal or pelvic pain, vaginal bleeding or discharge, or other concerns.   I provided primary care clinics you may follow up with and have also referred you to OB/GYN for further management if needed or you may see an OB/GYN of your own choosing. Otherwise your workup was reassuring. Malaria testing will not result today. You may follow up on these results in MyChart or with your doctor.   For any new concerns or worsening condition, return to nearest ED for re-evaluation.

## 2023-04-08 NOTE — ED Notes (Signed)
Wants to be tested for malaria, states recently got back from traveling to [place] & [place]. Took antimalaria drugs from June 3rd- 21st as she had malaria last year.
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
## 2023-04-08 NOTE — ED Triage Notes (Signed)
States had syncopal episode at work yesterday, having intermittent dizziness x 1 week. Feels increased dizziness with standing.

## 2023-04-10 LAB — PLASMODIUM SP. PCR: Plasmodium Sp. PCR: NEGATIVE

## 2023-04-21 LAB — PARASITE EXAM, BLOOD

## 2023-04-24 ENCOUNTER — Telehealth (INDEPENDENT_AMBULATORY_CARE_PROVIDER_SITE_OTHER): Payer: Self-pay | Admitting: Primary Care

## 2023-04-24 NOTE — Telephone Encounter (Signed)
Left VM with Pt 

## 2023-04-25 ENCOUNTER — Encounter (INDEPENDENT_AMBULATORY_CARE_PROVIDER_SITE_OTHER): Payer: Self-pay

## 2023-04-25 ENCOUNTER — Encounter (INDEPENDENT_AMBULATORY_CARE_PROVIDER_SITE_OTHER): Payer: Medicaid Other | Admitting: Primary Care

## 2023-04-29 ENCOUNTER — Other Ambulatory Visit: Payer: Self-pay

## 2023-04-29 ENCOUNTER — Telehealth: Payer: Self-pay

## 2023-04-29 ENCOUNTER — Ambulatory Visit (INDEPENDENT_AMBULATORY_CARE_PROVIDER_SITE_OTHER): Payer: Medicaid Other

## 2023-04-29 VITALS — BP 134/78 | HR 93 | Ht 67.0 in | Wt 299.4 lb

## 2023-04-29 DIAGNOSIS — Z3A00 Weeks of gestation of pregnancy not specified: Secondary | ICD-10-CM

## 2023-04-29 DIAGNOSIS — O3680X0 Pregnancy with inconclusive fetal viability, not applicable or unspecified: Secondary | ICD-10-CM

## 2023-04-29 LAB — BETA HCG QUANT (REF LAB): hCG Quant: 35861 m[IU]/mL

## 2023-04-29 NOTE — Progress Notes (Signed)
Beta HCG Follow-up Visit  Maria Morgan presents to [facility] for follow-up beta HCG lab. She was seen in ED for  lightheadedness, dizziness, syncope, and nausea  on 04/08/23. Patient denies pain, bleeding today. Discussed with patient that we are following beta HCG levels today. Results will be back in approximately 2 hours. Valid contact number for patient confirmed. I will call the patient with results.   Patient's beta HCG on 04/08/23 was 290, but [place] did not identify IUP.   Results and patient history reviewed with Dr. Crissie Reese, who states patient is to get a viability [place] as soon as possible. Patient called and informed of plan for follow-up.  [name] 04/29/2023 10:08 AM

## 2023-04-29 NOTE — Telephone Encounter (Signed)
Attempted to call patient x3 in regards to next steps since stat beta results returned from this morning.   Please see note from Nurse Visit on 04/29/23.   Patient's stat beta increased today from 290 to 35861. Patient needs dating/viability [place] as soon as possible. Suspected to be about 7w GA. Order for [place] placed.   Maureen Ralphs RN on 04/29/23 at [phone]

## 2023-04-30 ENCOUNTER — Ambulatory Visit (INDEPENDENT_AMBULATORY_CARE_PROVIDER_SITE_OTHER): Payer: Medicaid Other

## 2023-04-30 DIAGNOSIS — Z3481 Encounter for supervision of other normal pregnancy, first trimester: Secondary | ICD-10-CM

## 2023-04-30 DIAGNOSIS — O3680X0 Pregnancy with inconclusive fetal viability, not applicable or unspecified: Secondary | ICD-10-CM

## 2023-04-30 DIAGNOSIS — Z3A01 Less than 8 weeks gestation of pregnancy: Secondary | ICD-10-CM | POA: Diagnosis not present

## 2023-04-30 NOTE — Progress Notes (Signed)
Sent by [facility] needed to be seen by OBGYN  pregnant but location unknown

## 2023-04-30 NOTE — Telephone Encounter (Signed)
Called patient and informed her of results and discussed follow up ultrasound. Scheduled patient for today at 3:15. Patient verbalized understanding.

## 2023-05-09 ENCOUNTER — Encounter: Payer: Self-pay | Admitting: Family Medicine

## 2023-05-09 ENCOUNTER — Other Ambulatory Visit: Payer: Self-pay | Admitting: Family Medicine

## 2023-05-09 DIAGNOSIS — O0991 Supervision of high risk pregnancy, unspecified, first trimester: Secondary | ICD-10-CM | POA: Insufficient documentation

## 2023-05-09 DIAGNOSIS — O0993 Supervision of high risk pregnancy, unspecified, third trimester: Secondary | ICD-10-CM | POA: Insufficient documentation

## 2023-05-27 ENCOUNTER — Telehealth: Payer: Self-pay

## 2023-05-27 NOTE — Telephone Encounter (Signed)
Left voicemail for patient to call back and confirm her New OB appointment on 8/20.

## 2023-05-28 ENCOUNTER — Other Ambulatory Visit (HOSPITAL_BASED_OUTPATIENT_CLINIC_OR_DEPARTMENT_OTHER): Payer: Self-pay

## 2023-05-28 ENCOUNTER — Other Ambulatory Visit: Payer: Self-pay

## 2023-05-28 ENCOUNTER — Emergency Department (HOSPITAL_BASED_OUTPATIENT_CLINIC_OR_DEPARTMENT_OTHER)
Admission: EM | Admit: 2023-05-28 | Discharge: 2023-05-28 | Disposition: A | Discharge status: Home / Self Care | Payer: 59 | Attending: Emergency Medicine | Admitting: Emergency Medicine

## 2023-05-28 ENCOUNTER — Emergency Department (HOSPITAL_BASED_OUTPATIENT_CLINIC_OR_DEPARTMENT_OTHER): Payer: 59

## 2023-05-28 DIAGNOSIS — Z7982 Long term (current) use of aspirin: Secondary | ICD-10-CM | POA: Insufficient documentation

## 2023-05-28 DIAGNOSIS — Z3A11 11 weeks gestation of pregnancy: Secondary | ICD-10-CM | POA: Diagnosis not present

## 2023-05-28 DIAGNOSIS — O4690 Antepartum hemorrhage, unspecified, unspecified trimester: Secondary | ICD-10-CM

## 2023-05-28 DIAGNOSIS — O209 Hemorrhage in early pregnancy, unspecified: Secondary | ICD-10-CM | POA: Insufficient documentation

## 2023-05-28 LAB — CBC WITH DIFFERENTIAL/PLATELET
Abs Immature Granulocytes: 0 10*3/uL (ref 0.00–0.07)
Basophils Absolute: 0 10*3/uL (ref 0.0–0.1)
Basophils Relative: 0 %
Eosinophils Absolute: 0 10*3/uL (ref 0.0–0.5)
Eosinophils Relative: 1 %
HCT: 34.4 % — ABNORMAL LOW (ref 36.0–46.0)
Hemoglobin: 12.1 g/dL (ref 12.0–15.0)
Immature Granulocytes: 0 %
Lymphocytes Relative: 25 %
Lymphs Abs: 1 10*3/uL (ref 0.7–4.0)
MCH: 29.8 pg (ref 26.0–34.0)
MCHC: 35.2 g/dL (ref 30.0–36.0)
MCV: 84.7 fL (ref 80.0–100.0)
Monocytes Absolute: 0.4 10*3/uL (ref 0.1–1.0)
Monocytes Relative: 10 %
Neutro Abs: 2.5 10*3/uL (ref 1.7–7.7)
Neutrophils Relative %: 64 %
Platelets: 187 10*3/uL (ref 150–400)
RBC: 4.06 MIL/uL (ref 3.87–5.11)
RDW: 12.1 % (ref 11.5–15.5)
WBC: 4 10*3/uL (ref 4.0–10.5)
nRBC: 0 % (ref 0.0–0.2)

## 2023-05-28 LAB — URINALYSIS, ROUTINE W REFLEX MICROSCOPIC
Bilirubin Urine: NEGATIVE
Glucose, UA: NEGATIVE mg/dL
Hgb urine dipstick: NEGATIVE
Ketones, ur: NEGATIVE mg/dL
Leukocytes,Ua: NEGATIVE
Nitrite: NEGATIVE
Protein, ur: NEGATIVE mg/dL
Specific Gravity, Urine: >=1.03 (ref 1.005–1.030)
pH: 6.5 (ref 5.0–8.0)

## 2023-05-28 LAB — ABO/RH: ABO/RH(D): A POS

## 2023-05-28 LAB — BASIC METABOLIC PANEL
Anion gap: 8 (ref 5–15)
BUN: 6 mg/dL (ref 6–20)
CO2: 19 mmol/L — ABNORMAL LOW (ref 22–32)
Calcium: 8.6 mg/dL — ABNORMAL LOW (ref 8.9–10.3)
Chloride: 106 mmol/L (ref 98–111)
Creatinine, Ser: 0.69 mg/dL (ref 0.44–1.00)
GFR, Estimated: >60 mL/min (ref >60)
Glucose, Bld: 108 mg/dL — ABNORMAL HIGH (ref 70–99)
Potassium: 3.5 mmol/L (ref 3.5–5.1)
Sodium: 133 mmol/L — ABNORMAL LOW (ref 135–145)

## 2023-05-28 LAB — WET PREP, GENITAL
Clue Cells Wet Prep HPF POC: NONE SEEN
Sperm: NONE SEEN
Trich, Wet Prep: NONE SEEN
WBC, Wet Prep HPF POC: <10 (ref <10)
Yeast Wet Prep HPF POC: NONE SEEN

## 2023-05-28 LAB — HCG, QUANTITATIVE, PREGNANCY: hCG, Beta Chain, Quant, S: 66889 m[IU]/mL — ABNORMAL HIGH (ref <5)

## 2023-05-28 LAB — HIV ANTIBODY (ROUTINE TESTING W REFLEX): HIV Screen 4th Generation wRfx: NONREACTIVE

## 2023-05-28 MED ORDER — SODIUM CHLORIDE 0.9 % IV BOLUS
1000.0000 mL | Freq: Once | INTRAVENOUS | Status: AC
  Administered 2023-05-28: 1000 mL via INTRAVENOUS

## 2023-05-28 MED ORDER — PRENATAL VITAMIN 27-0.8 MG PO TABS
1.0000 | ORAL_TABLET | Freq: Every day | ORAL | 0 refills | Status: AC
  Filled 2023-05-28: qty 30, 30d supply, fill #0

## 2023-05-28 NOTE — ED Notes (Signed)
ED Provider at bedside. 

## 2023-05-28 NOTE — ED Triage Notes (Signed)
States G3P2, EDD 12/17/23.  C/O vaginal spotting of yellow and pinkish in color with some cramping x 4 days.
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
## 2023-05-29 LAB — GC/CHLAMYDIA PROBE AMP (~~LOC~~) NOT AT ARMC
Chlamydia: NEGATIVE
Comment: NEGATIVE
Comment: NORMAL
Neisseria Gonorrhea: NEGATIVE

## 2023-06-03 ENCOUNTER — Ambulatory Visit (INDEPENDENT_AMBULATORY_CARE_PROVIDER_SITE_OTHER): Payer: Medicaid Other | Admitting: Obstetrics and Gynecology

## 2023-06-03 ENCOUNTER — Other Ambulatory Visit (HOSPITAL_COMMUNITY)
Admission: RE | Admit: 2023-06-03 | Discharge: 2023-06-03 | Disposition: A | Discharge status: Home / Self Care | Payer: 59 | Source: Ambulatory Visit | Attending: Obstetrics and Gynecology | Admitting: Obstetrics and Gynecology

## 2023-06-03 ENCOUNTER — Other Ambulatory Visit (INDEPENDENT_AMBULATORY_CARE_PROVIDER_SITE_OTHER): Payer: 59

## 2023-06-03 ENCOUNTER — Encounter: Payer: Self-pay | Admitting: Obstetrics and Gynecology

## 2023-06-03 VITALS — BP 123/79 | HR 89 | Wt 304.0 lb

## 2023-06-03 DIAGNOSIS — Z3481 Encounter for supervision of other normal pregnancy, first trimester: Secondary | ICD-10-CM

## 2023-06-03 DIAGNOSIS — Z86018 Personal history of other benign neoplasm: Secondary | ICD-10-CM | POA: Diagnosis not present

## 2023-06-03 DIAGNOSIS — Z3480 Encounter for supervision of other normal pregnancy, unspecified trimester: Secondary | ICD-10-CM | POA: Diagnosis present

## 2023-06-03 DIAGNOSIS — Z3A11 11 weeks gestation of pregnancy: Secondary | ICD-10-CM

## 2023-06-03 DIAGNOSIS — O0991 Supervision of high risk pregnancy, unspecified, first trimester: Secondary | ICD-10-CM

## 2023-06-03 DIAGNOSIS — Z3A12 12 weeks gestation of pregnancy: Secondary | ICD-10-CM | POA: Diagnosis not present

## 2023-06-03 MED ORDER — ASPIRIN 81 MG PO TBEC
81.0000 mg | DELAYED_RELEASE_TABLET | Freq: Every day | ORAL | 2 refills | Status: DC
Start: 2023-06-03 — End: 2023-10-14

## 2023-06-03 NOTE — Progress Notes (Signed)
INITIAL PRENATAL VISIT  Subjective:   Maria Morgan is being seen today for her first obstetrical visit. She is at [redacted]w[redacted]d gestation by U/S. Her obstetrical history is significant for obesity. Patient does intend to breast feed. Pregnancy history fully reviewed.  Patient reports  occasional spotting, was seen in ED no bleeding in vault, u/s nml .  Indications for ASA therapy (per uptodate) One of the following: Previous pregnancy with preeclampsia, especially early onset and with an adverse outcome No Multifetal gestation No Chronic hypertension No Type 1 or 2 diabetes mellitus No Chronic kidney disease No Autoimmune disease (antiphospholipid syndrome, systemic lupus erythematosus) No  Two or more of the following: Nulliparity No Obesity (body mass index >30 kg/m2) Yes Family history of preeclampsia in mother or sister No Age ?35 years No Sociodemographic characteristics (African American race, low socioeconomic level) Yes Personal risk factors (eg, previous pregnancy with low birth weight or small for gestational age infant, previous adverse pregnancy outcome [eg, stillbirth], interval >10 years between pregnancies) No  Indications for early GDM screening  First-degree relative with diabetes No BMI >30kg/m2 Yes Age > 25 Yes Previous birth of an infant weighing ?4000 g No Gestational diabetes mellitus in a previous pregnancy No Glycated hemoglobin ?5.7 percent (39 mmol/mol), impaired glucose tolerance, or impaired fasting glucose on previous testing No High-risk race/ethnicity (eg, African American, Latino, Native American, [place] American, Pacific Islander) Yes Previous stillbirth of unknown cause No Maternal birthweight > 9 lbs No History of cardiovascular disease No  Early screening tests: FBS, A1C, Random CBG, glucose challenge  Objective:    Obstetric History OB History  Gravida Para Term Preterm AB Living  3 2 2     2   SAB IAB Ectopic Multiple Live Births           2    # Outcome Date GA Lbr Len/2nd Weight Sex Type Anes PTL Lv  3 Current           2 Term 05/02/17 [redacted]w[redacted]d  7 lb 8 oz (3.402 kg) M Vag-Spont None N LIV  1 Term 01/09/13 [redacted]w[redacted]d 09:40 / 00:21 6 lb 15.3 oz (3.155 kg) M Vag-Spont EPI  LIV    Past Medical History:  Diagnosis Date   Brain tumor (benign) (HCC)    Chlamydia    Gonorrhea    Malaria     Past Surgical History:  Procedure Laterality Date   NO PAST SURGERIES      Current Outpatient Medications on File Prior to Visit  Medication Sig Dispense Refill   aspirin EC 81 MG EC tablet Take 1 tablet (81 mg total) by mouth daily. Swallow whole. (Patient not taking: Reported on 04/29/2023) 30 tablet 11   doxylamine, Sleep, (UNISOM) 25 MG tablet Take 0.5 tablets (12.5 mg total) by mouth every 8 (eight) hours as needed for up to 5 days. 8 tablet 0   folic acid (FOLVITE) 1 MG tablet Take 1 mg by mouth daily.     Prenatal Vit-Fe Fumarate-FA (PRENATAL VITAMIN) 27-0.8 MG TABS Take 1 capsule by mouth daily. 30 tablet 0   valACYclovir (VALTREX) 1000 MG tablet SMARTSIG:1 Tablet(s) By Mouth Every 12 Hours     No current facility-administered medications on file prior to visit.    Allergies  Allergen Reactions   Iodine Shortness Of Breath and Itching    Contrast in IV    Social History:  reports that she has never smoked. She has never used smokeless tobacco. She reports that she does not  currently use alcohol. She reports that she does not use drugs.  Family History  Problem Relation Age of Onset   Cancer Mother    Diabetes Paternal Grandfather     The following portions of the patient's history were reviewed and updated as appropriate: allergies, current medications, past family history, past medical history, past social history, past surgical history and problem list.  Review of Systems Review of Systems  All other systems reviewed and are negative.   Physical Exam:  Wt (!) 304 lb (137.9 kg)   LMP 03/10/2023 (Approximate)   BMI  47.61 kg/m  CONSTITUTIONAL: Well-developed, well-nourished female in no acute distress.  HENT:  Normocephalic, atraumatic.   EYES: Conjunctivae normal.  NECK: Normal range of motion SKIN: Skin is warm and dry MUSCULOSKELETAL: Normal range of motion NEUROLOGIC: Alert and oriented  PSYCHIATRIC: Normal mood and affect. Normal behavior. Normal judgment and thought content. CARDIOVASCULAR: Normal heart rate RESPIRATORY: Normal effort ABDOMEN: Soft PELVIC: Normal appearing external genitalia; normal appearing vaginal mucosa and cervix.  No abnormal discharge noted.  Pap smear obtained.             Assessment:    Pregnancy: G3P2002  1. Supervision of other normal pregnancy, antepartum 2. [redacted] weeks gestation of pregnancy BP and FHR normal Discussed recommendation on aspirin during pregnancy , to start at 12 weeks Initial labs drawn. Prenatal vitamins. Problem list reviewed and updated. Reviewed in detail the nature of the practice with collaborative care between  Genetic screening discussed: NIPS/First trimester screen/Quad/AFP ordered. Role of ultrasound in pregnancy discussed; Anatomy US: ordered.  - CBC/D/Plt+RPR+Rh+ABO+RubIgG... - Urine Culture - Cytology - PAP( [place]) - [place] MFM OB DETAIL +14 WK; Future - Babyscripts Schedule Optimization - PANORAMA PRENATAL TEST - aspirin EC 81 MG tablet; Take 1 tablet (81 mg total) by mouth daily. Start taking when you are [redacted] weeks pregnant for rest of pregnancy for prevention of preeclampsia  Dispense: 300 tablet; Refill: 2 - HgB A1c - Interpretation:  3. 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 Was suffereing headaches and episodes LOC as well as blurred vision, had reg menstrual cycles and galactorrhea, prolactin was elevated at the time. Was seeing [facility]. Was taking medication for it and stopped a long time ago per endocrine. Pituitary MRI  2013 or 2014 and  didn't see anything per patient.   Return in 4 weeks for routine prenatal  Future Appointments  Date Time Provider Department Center  07/01/2023  9:15 AM Sue Lush, FNP [facility] None  07/30/2023  9:15 AM Levie Heritage, DO [facility] None  08/01/2023  8:15 AM [facility] NURSE [facility]  08/01/2023  8:30 AM [facility] US3 [facility]    Sue Lush, FNP

## 2023-06-03 NOTE — Patient Instructions (Addendum)
We highly recommend childbirth education to help you plan for labor and begin practicing coping skills (which will be needed with or without pain meds).  [place] Childbirth Education Options: Sign up by visiting [email or web address]  Childbirth ~ Self-Paced eClass (English and Spanish) This online class offers you the freedom to complete a childbirth education series in the comfort of your own home at your own pace.  Childbirth Class (In-Person 4-Week Series  or on Saturdays, Virtual 4-Week Series ~ [place]) This interactive in-person class series will help you and your partner prepare for your birth experience. Topics include: Labor & Birth, Comfort Measures, Breathing Techniques, Massage, Medical Interventions, Pain Management Options, Cesarean Birth, Postpartum Care, and Newborn Care  Comfort Techniques for Labor ~ In-Person Class [facility]) This interactive class is designed for parents-to-be who want to learn & practice hands-on skills to help relieve some of the discomfort of labor and encourage their babies to rotate toward the best position for birth. Moms and their partners will be able to try a variety of labor positions with birth balls and rebozos as well as practice breathing, relaxation, and visualization techniques.                   Safe Medications in Pregnancy    Acne: Benzoyl Peroxide Salicylic Acid  Backache/Headache: Tylenol: 2 regular strength every 4 hours OR              2 Extra strength every 6 hours  Colds/Coughs/Allergies: Benadryl (alcohol free) 25 mg every 6 hours as needed Breath right strips Claritin Cepacol throat lozenges Chloraseptic throat spray Cold-Eeze- up to three times per day Cough drops, alcohol free Flonase (by prescription only) Guaifenesin Mucinex Robitussin DM (plain only, alcohol free) Saline nasal spray/drops Sudafed (pseudoephedrine) & Actifed ** use only after [redacted] weeks gestation and if you do not have high blood  pressure Tylenol Vicks Vaporub Zinc lozenges Zyrtec   Constipation: Colace Ducolax suppositories Fleet enema Glycerin suppositories Metamucil Milk of magnesia Miralax Senokot Smooth move tea  Diarrhea: Kaopectate Imodium A-D  *NO pepto Bismol  Hemorrhoids: Anusol Anusol HC Preparation H Tucks  Indigestion: Tums Maalox Mylanta Zantac  Pepcid  Insomnia: Benadryl (alcohol free) 25mg  every 6 hours as needed Tylenol PM Unisom, no Gelcaps  Leg Cramps: Tums MagGel  Nausea/Vomiting:  Bonine Dramamine Emetrol Ginger extract Sea bands Meclizine  Nausea medication to take during pregnancy:  Unisom (doxylamine succinate 25 mg tablets) Take one tablet daily at bedtime. If symptoms are not adequately controlled, the dose can be increased to a maximum recommended dose of two tablets daily (1/2 tablet in the morning, 1/2 tablet mid-afternoon and one at bedtime). Vitamin B6 100mg  tablets. Take one tablet twice a day (up to 200 mg per day).  Skin Rashes: Aveeno products Benadryl cream or 25mg  every 6 hours as needed Calamine Lotion 1% cortisone cream  Yeast infection: Gyne-lotrimin 7 Monistat 7   **If taking multiple medications, please check labels to avoid duplicating the same active ingredients **take medication as directed on the label ** Do not exceed 4000 mg of tylenol in 24 hours **Do not take medications that contain aspirin or ibuprofen     Natural Childbirth Class (In-Person 5-Week Series, In-Person on Saturdays or Virtual 5-Week Series ~ [place]) This class series is designed for expectant parents who want to learn and practice natural methods of coping with the process of labor and childbirth.  Cesarean Birth Self-Paced eClass (English and Spanish) This online course provides comprehensive information you  can trust as you prepare for a possible cesarean birth. In this class, you'll learn how to make your birth and recovery comfortable and  joyful through instructive video clips, animations, and activities.  Waterbirth ~ Airline pilot Interested in a waterbirth? In addition to a consultation with your credentialed waterbirth provider, this free, informational online class will help you discover whether waterbirth is the right fit for you. Not all obstetrical practices offer waterbirth, so check with your healthcare provider.  Tour Probation officer) - [facility] [employer] our 4 minute video tour of [facility] located in [place].   [place] Parenting Education Options:  Pregnancy 101 (Virtual) Congratulations on your pregnancy! This class is geared toward moms in their first trimester, but everyone is welcome. We are excited to guide you through all aspects of supporting a healthy pregnancy. You will learn what to expect at routine prenatal care appointments, common postpartum adjustments, basic infant safety, and breastfeeding.  Successful Partnering & Parenting ~ In-Person Workshop [facility]) This workshop inspires and equips partners of all economic levels, ages, and cultures to confidently care for their infants, support the birthing persons, and navigate their own transformations into new partners and parents. Learning activities are geared towards supporting partner, but moms are welcome to attend.  'Baby & Me' Parenting Group (Virtual on Wednesdays at 11am) Enjoy this time discussing newborn & infant parenting topics and family adjustment issues with other new parents in a relaxed environment. Each week brings a new speaker or baby-centered activity. This group offers support and connection to parents as they journey through the adjustments and struggles of that sometimes overwhelming first year after the birth of a child.  Baby Safety, CPR, & Choking Class ~ Virtual This life-saving information is meant to encourage parents as they learn important safety and  prevention tips as well as infant CPR and relief of choking.  Breastfeeding Class (In-Person in [place] or [employer]) Families learn what to expect in the first days and weeks of breastfeeding your newborn. IF YOU ARE AN EMPLOYEE TAKING THIS CLASS FOR CREDIT, DO NOT register yourself. Please e-mail [email or web address].   Breastfeeding Self-Paced eClass (English & Spanish) Families learn what to expect in the first days and weeks of breastfeeding your newborn.  Caring for Baby ~ In-Person, Virtual or Self-Paced Class This in-person class is for both expectant and adoptive parents who want to learn and practice the most up-to-date newborn care for their babies. Focus is on birth through the first six weeks of life.  CPR & Choking Relief for Infants & Children ~ In-Person Class [facility]) This in-person course is designed for any parent, expectant parent, or adult who cares for infants or children. Participants learn and demonstrate cardiopulmonary resuscitation and choking relief procedures for both infants and children.  Grandparent Love ~ In-Person Class Grandparents will learn the most updated infant care and safety recommendations. They will discover ways to support their own children during the transition into the parenting role and receive tips on communicating with the new parents.  [place] Parenting Support Group Options:  Bereavement Grief Support Group (Pregnancy/Infant Loss) - Virtual This is an ongoing experience that meets once a month and is designed to help you honor the past, assist you in discovering tools to strengthen you today, and aid you in developing hope for the future.  Breastfeeding & Pumping Support Group (In-Person on Thursdays at 12pm or Virtual on Tuesdays at 5pm) Join [place] in-person each Thursday starting  June 1st, 2023 at 12pm! This support group is free for all families looking for breastfeeding and/or pumping support.   Community-Based Childbirth  Education Options:  [facility] Department Classes:  Childbirth education classes can help you get ready for a positive parenting experience. You can also meet other expectant parents and get free stuff for your baby. Each class runs for five weeks on the same night and costs $45 for the mother-to-be and her support person. Medicaid covers the cost if you are eligible. Call [phone] to register.  [facility] offers a variety of programs for the [employer] and is another great way to get connected. Please go to [email or web address] for more information.  Childbirth With A Twist! Be informed of your options, get educated on birth, understand what your body is doing, learn how to cope, and have a lot of fun and laughs all while doing it either from the comfort of your couch OR in our cozy office and classroom space near the [place] airport. If you are taking a virtual class, then class is taught LIVE, so you can ask questions and receive answers in real-time from an experienced doula and childbirth educator.  This virtual childbirth education class will meet for five instruction times online.  Although we are based in [place], [place], this virtual class is open to anyone in the world. Please visit: [email or web address] for more information.  Books We Love: The Doula Guide to Childbirth by Harland German and Otila Back The First-Time Parent's Childbirth Handbook by Dr. Amie Critchley, CNM The Birth Partner by Truddie Crumble

## 2023-06-04 LAB — CBC/D/PLT+RPR+RH+ABO+RUBIGG...
Antibody Screen: NEGATIVE
Basophils Absolute: 0 10*3/uL (ref 0.0–0.2)
Basos: 0 %
EOS (ABSOLUTE): 0 10*3/uL (ref 0.0–0.4)
Eos: 1 %
HCV Ab: NONREACTIVE
HIV Screen 4th Generation wRfx: NONREACTIVE
Hematocrit: 35.3 % (ref 34.0–46.6)
Hemoglobin: 12 g/dL (ref 11.1–15.9)
Hepatitis B Surface Ag: NEGATIVE
Immature Grans (Abs): 0 10*3/uL (ref 0.0–0.1)
Immature Granulocytes: 0 %
Lymphocytes Absolute: 1 10*3/uL (ref 0.7–3.1)
Lymphs: 23 %
MCH: 29.8 pg (ref 26.6–33.0)
MCHC: 34 g/dL (ref 31.5–35.7)
MCV: 88 fL (ref 79–97)
Monocytes Absolute: 0.5 10*3/uL (ref 0.1–0.9)
Monocytes: 11 %
Neutrophils Absolute: 2.8 10*3/uL (ref 1.4–7.0)
Neutrophils: 65 %
Platelets: 195 10*3/uL (ref 150–450)
RBC: 4.03 x10E6/uL (ref 3.77–5.28)
RDW: 13.7 % (ref 11.7–15.4)
RPR Ser Ql: NONREACTIVE
Rh Factor: POSITIVE
Rubella Antibodies, IGG: 2.31 {index} (ref >0.99)
WBC: 4.3 10*3/uL (ref 3.4–10.8)

## 2023-06-04 LAB — HCV INTERPRETATION

## 2023-06-04 LAB — HEMOGLOBIN A1C
Est. average glucose Bld gHb Est-mCnc: 111 mg/dL
Hgb A1c MFr Bld: 5.5 % (ref 4.8–5.6)

## 2023-06-04 LAB — PROLACTIN: Prolactin: 173 ng/mL — ABNORMAL HIGH (ref 4.8–33.4)

## 2023-06-05 LAB — CYTOLOGY - PAP
Chlamydia: NEGATIVE
Comment: NEGATIVE
Comment: NEGATIVE
Comment: NORMAL
Diagnosis: NEGATIVE
High risk HPV: NEGATIVE
Neisseria Gonorrhea: NEGATIVE

## 2023-06-05 LAB — URINE CULTURE

## 2023-06-11 LAB — PANORAMA PRENATAL TEST FULL PANEL:PANORAMA TEST PLUS 5 ADDITIONAL MICRODELETIONS: FETAL FRACTION: 2.3

## 2023-07-01 ENCOUNTER — Encounter: Payer: 59 | Admitting: Obstetrics and Gynecology

## 2023-07-14 ENCOUNTER — Ambulatory Visit (INDEPENDENT_AMBULATORY_CARE_PROVIDER_SITE_OTHER): Payer: 59 | Admitting: Obstetrics and Gynecology

## 2023-07-14 VITALS — BP 132/88 | HR 97 | Wt 309.0 lb

## 2023-07-14 DIAGNOSIS — O0992 Supervision of high risk pregnancy, unspecified, second trimester: Secondary | ICD-10-CM

## 2023-07-14 DIAGNOSIS — O0991 Supervision of high risk pregnancy, unspecified, first trimester: Secondary | ICD-10-CM

## 2023-07-14 DIAGNOSIS — E559 Vitamin D deficiency, unspecified: Secondary | ICD-10-CM

## 2023-07-14 DIAGNOSIS — Z3A17 17 weeks gestation of pregnancy: Secondary | ICD-10-CM

## 2023-07-14 NOTE — Progress Notes (Signed)
   PRENATAL VISIT NOTE  Subjective:  Maria Morgan is a 32 y.o. G3P2002 at [redacted]w[redacted]d being seen today for ongoing prenatal care.  She is currently monitored for the following issues for this low-risk pregnancy and has Pituitary adenoma (HCC); Vitamin D deficiency; Benign neoplasm of colon; Hyperprolactinemia (HCC); Adenomatous duodenal polyp; ADHD (attention deficit hyperactivity disorder); Malaria; Obesity, Class III, BMI 40-49.9 (morbid obesity) (HCC); Leukopenia; and Supervision of high risk pregnancy, antepartum, first trimester on their problem list.  Patient reports no complaints.  Contractions: Not present. Vag. Bleeding: None.  Movement: Present. Denies leaking of fluid.   The following portions of the patient's history were reviewed and updated as appropriate: allergies, current medications, past family history, past medical history, past social history, past surgical history and problem list.   Objective:   Vitals:   07/14/23 1018  BP: 132/88  Pulse: 97  Weight: (!) 309 lb (140.2 kg)    Fetal Status: Fetal Heart Rate (bpm): 145   Movement: Present     General:  Alert, oriented and cooperative. Patient is in no acute distress.  Skin: Skin is warm and dry. No rash noted.   Cardiovascular: Normal heart rate noted  Respiratory: Normal respiratory effort, no problems with respiration noted  Abdomen: Soft, gravid, appropriate for gestational age.  Pain/Pressure: Absent     Pelvic: Cervical exam deferred        Extremities: Normal range of motion.  Edema: None  Mental Status: Normal mood and affect. Normal behavior. Normal judgment and thought content.   Assessment and Plan:  Pregnancy: G3P2002 at [redacted]w[redacted]d 1. Supervision of high risk pregnancy, antepartum, first trimester H/o elevated LFTs. Check today.  MSAFP ordered.  Anatomy US Scheduled for 10/18.  Offered flu shot - pt declines - Comp Met (CMET) - AFP, Serum, Open Spina Bifida  2. Obesity, Class III, BMI 40-49.9 (morbid  obesity) (HCC)  3. Vitamin D deficiency Historic - will check today - Vitamin D 1,25 dihydroxy  Preterm labor symptoms and general obstetric precautions including but not limited to vaginal bleeding, contractions, leaking of fluid and fetal movement were reviewed in detail with the patient. Please refer to After Visit Summary for other counseling recommendations.   Return in about 4 weeks (around 08/11/2023) for OB VISIT, MD or APP.  Future Appointments  Date Time Provider Department Center  07/30/2023  9:15 AM Levie Heritage, DO [facility] None  08/01/2023  8:15 AM [facility] NURSE [facility]  08/01/2023  8:30 AM [facility] US3 [facility]  09/01/2023  9:35 AM Anyanwu, Jethro Bastos, MD [facility] None    Milas Hock, MD

## 2023-07-29 LAB — COMPREHENSIVE METABOLIC PANEL
ALT: 18 [IU]/L (ref 0–32)
AST: 16 [IU]/L (ref 0–40)
Albumin: 3.8 g/dL — ABNORMAL LOW (ref 3.9–4.9)
Alkaline Phosphatase: 43 [IU]/L — ABNORMAL LOW (ref 44–121)
BUN/Creatinine Ratio: 8 — ABNORMAL LOW (ref 9–23)
BUN: 5 mg/dL — ABNORMAL LOW (ref 6–20)
Bilirubin Total: 0.5 mg/dL (ref 0.0–1.2)
CO2: 19 mmol/L — ABNORMAL LOW (ref 20–29)
Calcium: 8.8 mg/dL (ref 8.7–10.2)
Chloride: 105 mmol/L (ref 96–106)
Creatinine, Ser: 0.6 mg/dL (ref 0.57–1.00)
Globulin, Total: 2.5 g/dL (ref 1.5–4.5)
Glucose: 77 mg/dL (ref 70–99)
Potassium: 3.9 mmol/L (ref 3.5–5.2)
Sodium: 138 mmol/L (ref 134–144)
Total Protein: 6.3 g/dL (ref 6.0–8.5)
eGFR: 122 mL/min/{1.73_m2} (ref >59)

## 2023-07-29 LAB — AFP, SERUM, OPEN SPINA BIFIDA
AFP MoM: 1.17
AFP Value: 34.9 ng/mL
Gest. Age on Collection Date: 17.6 wk
Maternal Age At EDD: 32.4 a
OSBR Risk 1 IN: 10000
Test Results:: NEGATIVE
Weight: 309 [lb_av]

## 2023-07-29 LAB — VITAMIN D 1,25 DIHYDROXY
Vitamin D 1, 25 (OH)2 Total: 125 pg/mL — ABNORMAL HIGH
Vitamin D2 1, 25 (OH)2: <10 pg/mL
Vitamin D3 1, 25 (OH)2: 125 pg/mL

## 2023-07-30 ENCOUNTER — Encounter: Payer: 59 | Admitting: Family Medicine

## 2023-08-01 ENCOUNTER — Other Ambulatory Visit: Payer: Self-pay | Admitting: *Deleted

## 2023-08-01 ENCOUNTER — Ambulatory Visit: Payer: 59 | Attending: Obstetrics and Gynecology

## 2023-08-01 ENCOUNTER — Ambulatory Visit: Payer: 59 | Admitting: *Deleted

## 2023-08-01 ENCOUNTER — Encounter: Payer: Self-pay | Admitting: *Deleted

## 2023-08-01 VITALS — BP 127/75 | HR 86

## 2023-08-01 DIAGNOSIS — O99211 Obesity complicating pregnancy, first trimester: Secondary | ICD-10-CM | POA: Insufficient documentation

## 2023-08-01 DIAGNOSIS — O0991 Supervision of high risk pregnancy, unspecified, first trimester: Secondary | ICD-10-CM

## 2023-08-01 DIAGNOSIS — D72819 Decreased white blood cell count, unspecified: Secondary | ICD-10-CM | POA: Diagnosis not present

## 2023-08-01 DIAGNOSIS — O99111 Other diseases of the blood and blood-forming organs and certain disorders involving the immune mechanism complicating pregnancy, first trimester: Secondary | ICD-10-CM | POA: Diagnosis not present

## 2023-08-01 DIAGNOSIS — D352 Benign neoplasm of pituitary gland: Secondary | ICD-10-CM | POA: Insufficient documentation

## 2023-08-01 DIAGNOSIS — Z363 Encounter for antenatal screening for malformations: Secondary | ICD-10-CM | POA: Insufficient documentation

## 2023-08-01 DIAGNOSIS — O99212 Obesity complicating pregnancy, second trimester: Secondary | ICD-10-CM | POA: Diagnosis not present

## 2023-08-01 DIAGNOSIS — Z3A11 11 weeks gestation of pregnancy: Secondary | ICD-10-CM | POA: Diagnosis not present

## 2023-08-01 DIAGNOSIS — Z362 Encounter for other antenatal screening follow-up: Secondary | ICD-10-CM

## 2023-08-01 DIAGNOSIS — Z3480 Encounter for supervision of other normal pregnancy, unspecified trimester: Secondary | ICD-10-CM | POA: Diagnosis present

## 2023-08-20 ENCOUNTER — Other Ambulatory Visit (HOSPITAL_BASED_OUTPATIENT_CLINIC_OR_DEPARTMENT_OTHER): Payer: Self-pay

## 2023-08-21 ENCOUNTER — Other Ambulatory Visit (HOSPITAL_BASED_OUTPATIENT_CLINIC_OR_DEPARTMENT_OTHER): Payer: Self-pay

## 2023-08-25 ENCOUNTER — Other Ambulatory Visit (HOSPITAL_BASED_OUTPATIENT_CLINIC_OR_DEPARTMENT_OTHER): Payer: Self-pay

## 2023-08-25 MED ORDER — MEFLOQUINE HCL 250 MG PO TABS
ORAL_TABLET | ORAL | 0 refills | Status: DC
  Filled 2023-08-25: qty 8, 30d supply, fill #0

## 2023-08-26 ENCOUNTER — Other Ambulatory Visit (HOSPITAL_BASED_OUTPATIENT_CLINIC_OR_DEPARTMENT_OTHER): Payer: Self-pay

## 2023-08-26 ENCOUNTER — Other Ambulatory Visit: Payer: Self-pay

## 2023-08-26 MED ORDER — CHLOROQUINE PHOSPHATE 500 MG PO TABS
ORAL_TABLET | ORAL | 0 refills | Status: DC
  Filled 2023-08-26: qty 7, 30d supply, fill #0

## 2023-08-28 ENCOUNTER — Other Ambulatory Visit (HOSPITAL_BASED_OUTPATIENT_CLINIC_OR_DEPARTMENT_OTHER): Payer: Self-pay

## 2023-08-29 IMAGING — US US HEPATIC LIVER DOPPLER
2 series · 13 of 25 positions shown · non-contrast
Comparison: None Available.

CLINICAL DATA: Increased LFTs

EXAM:
DUPLEX ULTRASOUND OF LIVER
TECHNIQUE: Color and duplex Doppler ultrasound was performed to evaluate the
hepatic in-flow and out-flow vessels.

[Series 1: us liver doppler · 43 acquisitions, 11 frames shown]
[im 1/43]
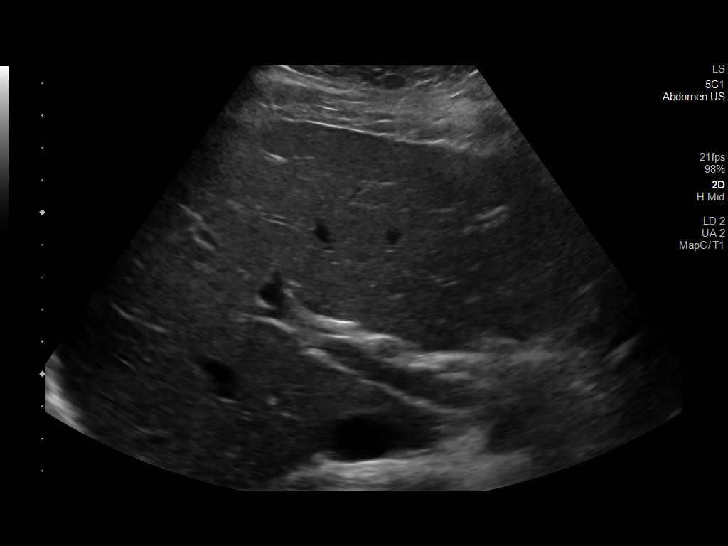
[im 5/43]
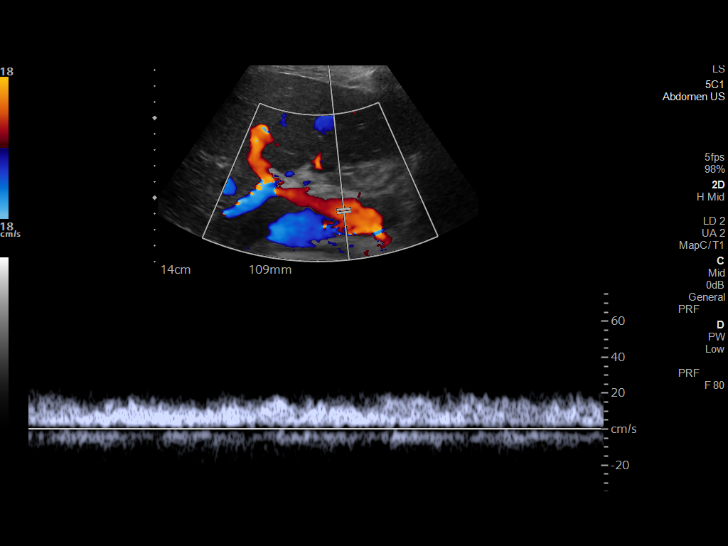
[im 9/43]
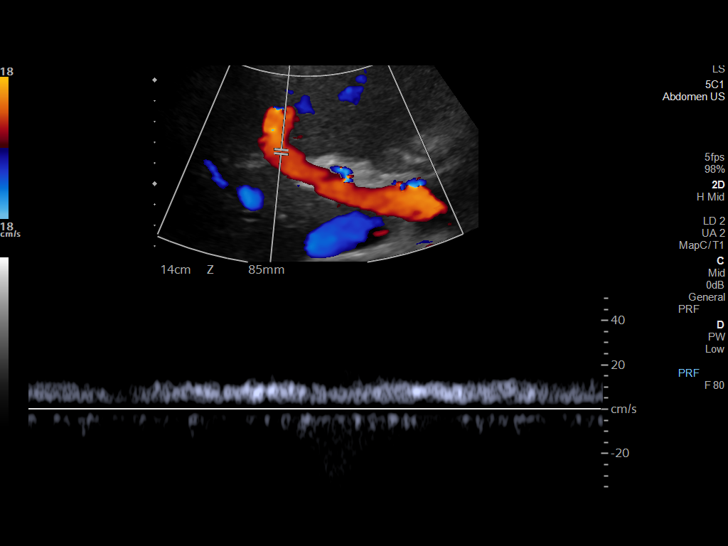
[im 13/43]
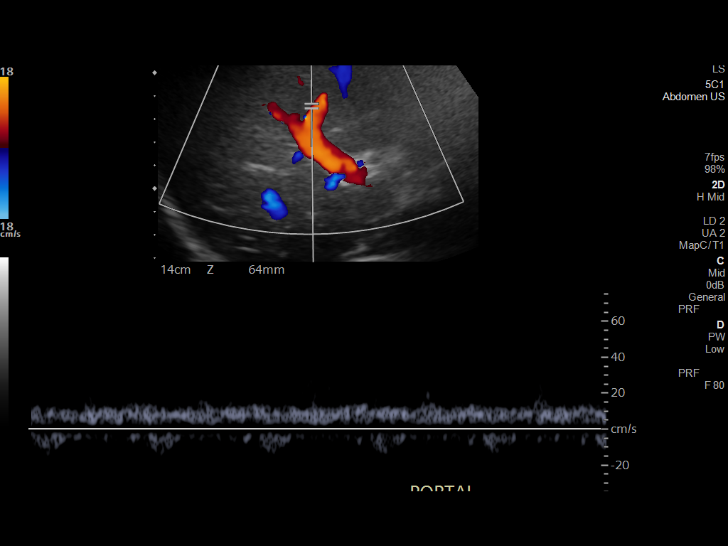
[im 17/43]
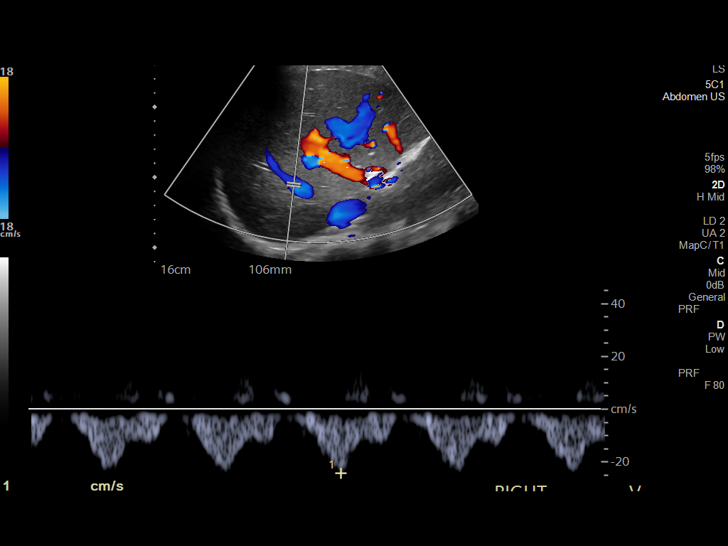
[im 21/43]
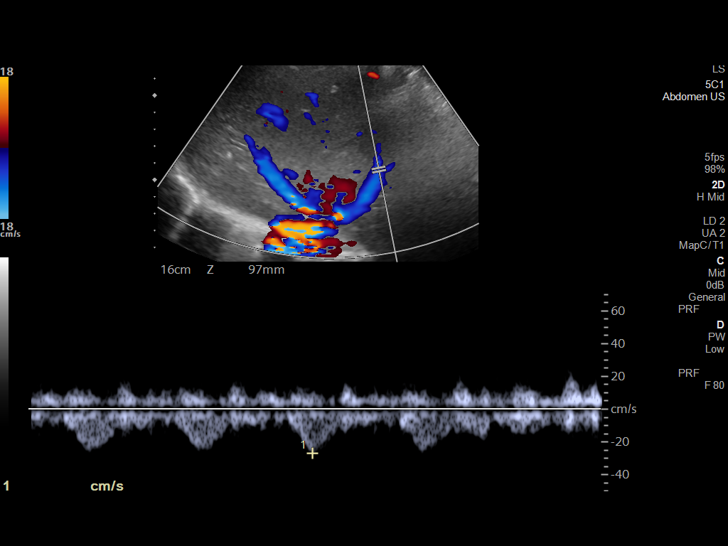
[im 25/43]
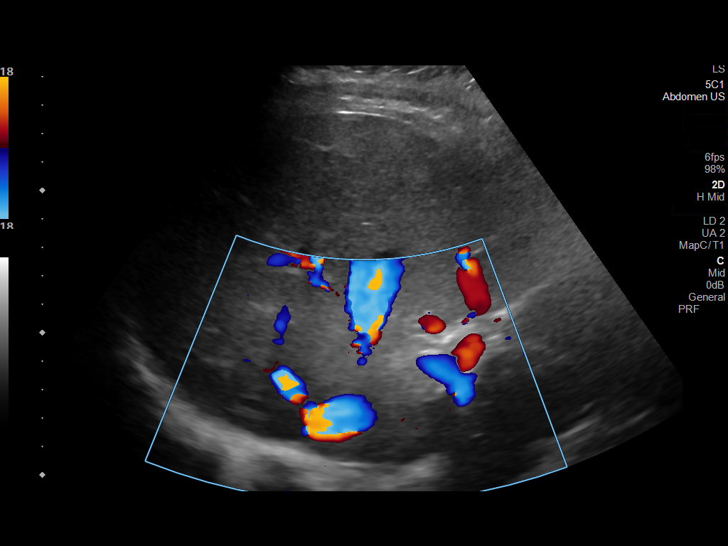
[im 29/43]
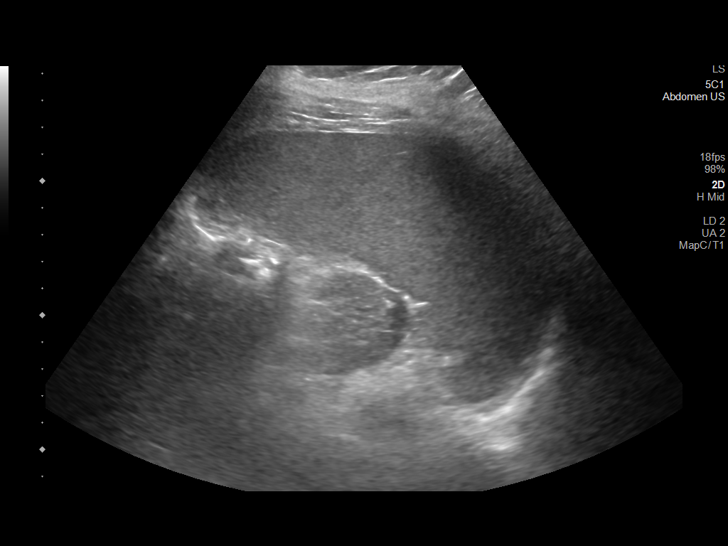
[im 33/43]
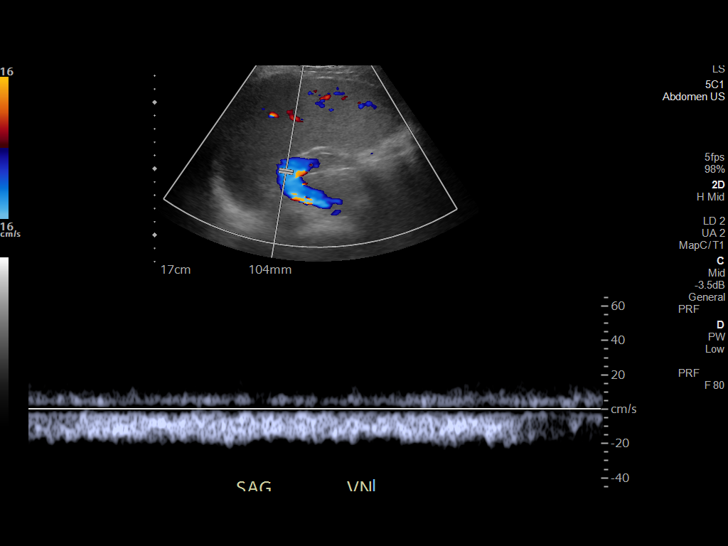
[im 37/43]
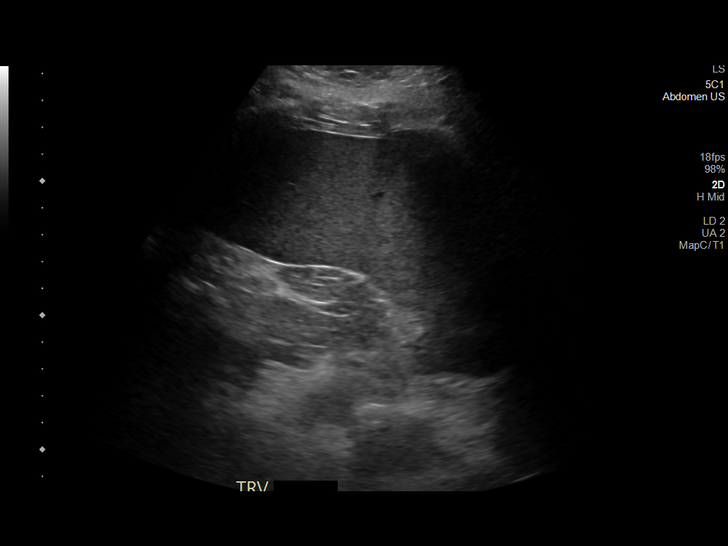
[im 41/43]
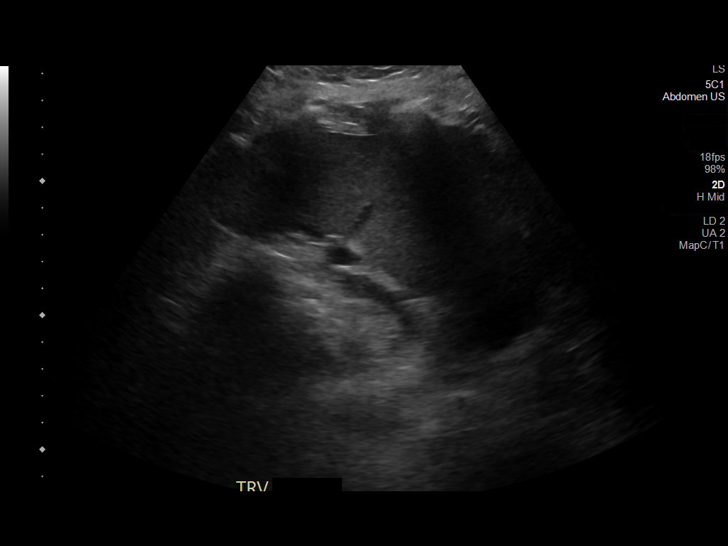

[Series 1001: abdomen us · 2 of 6 slices shown]
[im 1/6]
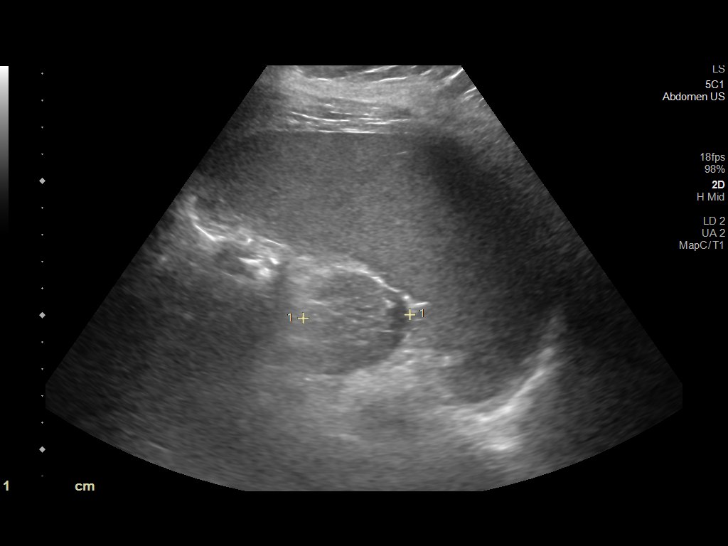
[im 6/6]
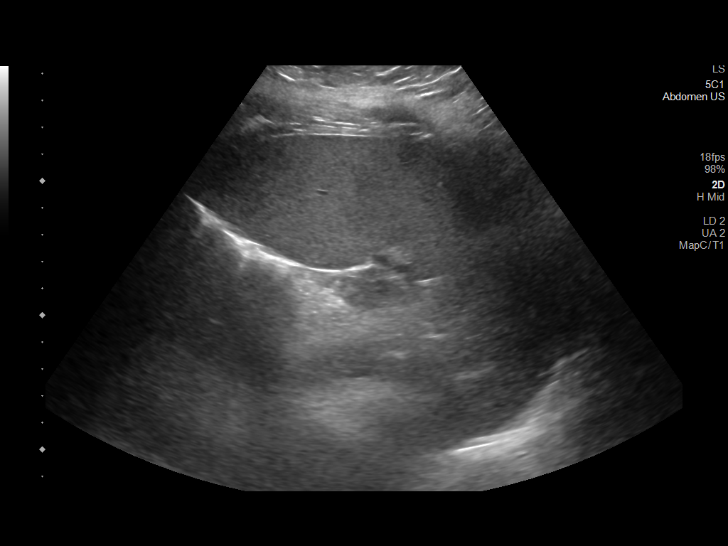

[13 of 25 positions shown; findings below may reference images not displayed]

FINDINGS: Liver: Normal parenchymal echogenicity. Normal hepatic contour
without nodularity.

No focal lesion, mass or intrahepatic biliary ductal dilatation.

Main Portal Vein size: 1.0 cm

Portal Vein Velocities

Main Prox:  20 cm/sec

Main Mid: 19 cm/sec

Main Dist:  13 cm/sec
Right: 12 cm/sec
Left: 15 cm/sec

Hepatic Vein Velocities

Right:  24 cm/sec

Middle:  36 cm/sec

Left:  26 cm/sec

IVC: Present and patent with normal respiratory phasicity.

Hepatic Artery Velocity:  120 cm/sec

Splenic Vein Velocity:  21 cm/sec

Spleen: 13.8 cm x 5.5 cm x 5.4 cm with a total volume of 213 cm^3
(411 cm^3 is upper limit normal)

Portal Vein Occlusion/Thrombus: No

Splenic Vein Occlusion/Thrombus: No

Ascites: None

Varices: None

Patent portal, hepatic and splenic veins with normal directional
flow. Negative for portal vein occlusion or thrombus. No upper
abdominal free fluid or ascites.
IMPRESSION: Normal hepatic venous Doppler

## 2023-08-29 IMAGING — US US ABDOMEN LIMITED
1 series · 14 of 25 positions shown · non-contrast
Comparison: None Available.

CLINICAL DATA: Transaminitis.

EXAM:
ULTRASOUND ABDOMEN LIMITED RIGHT UPPER QUADRANT

[Series 1: us abdomen limited ruq (liver/gb) · 14 of 58 slices shown]
[im 1/58]
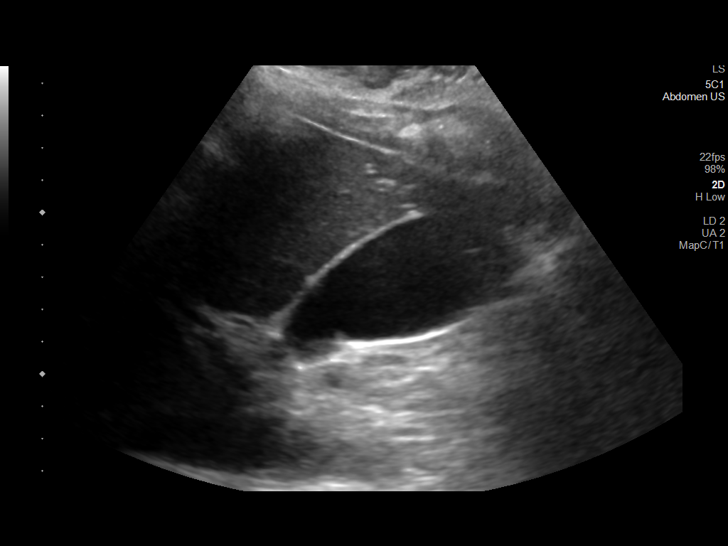
[im 5/58]
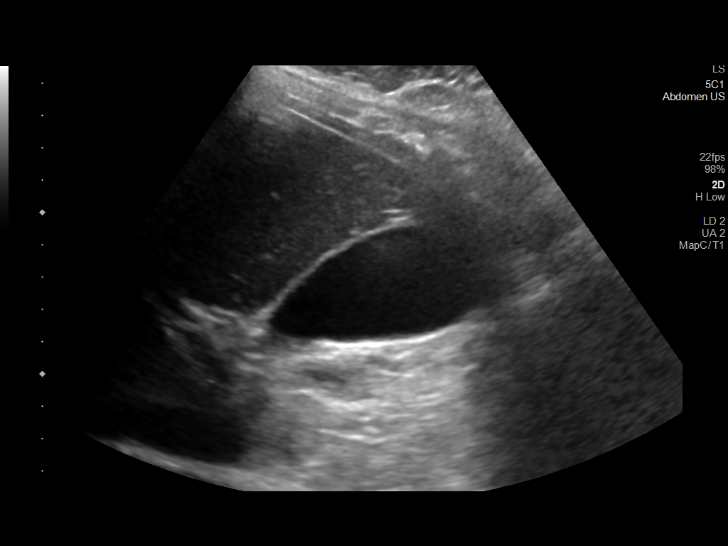
[im 10/58]
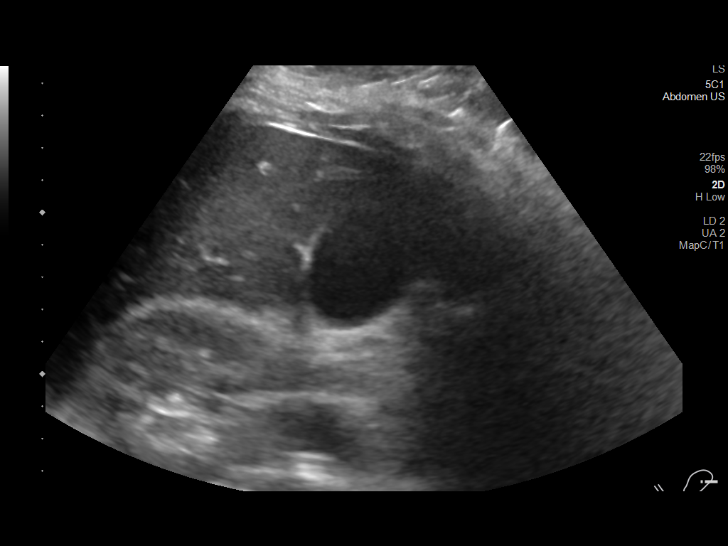
[im 15/58]
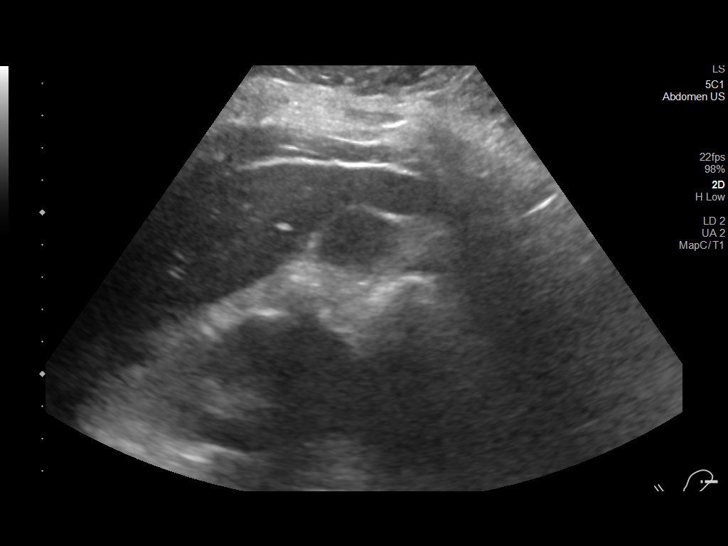
[im 20/58]
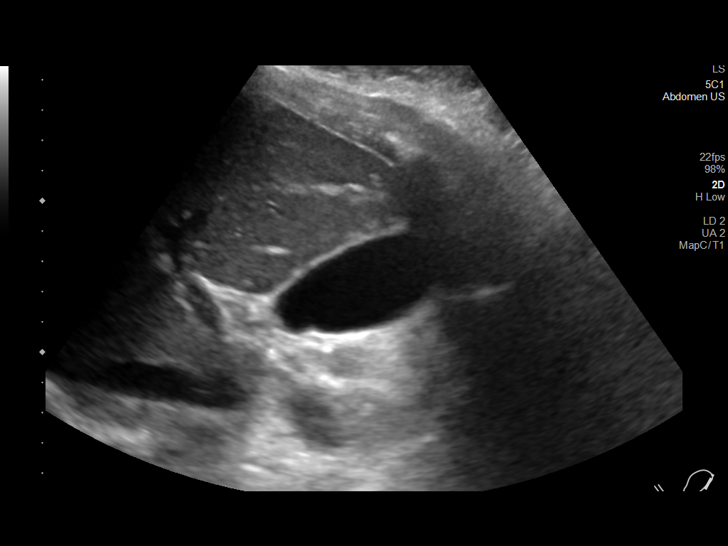
[im 22/58]
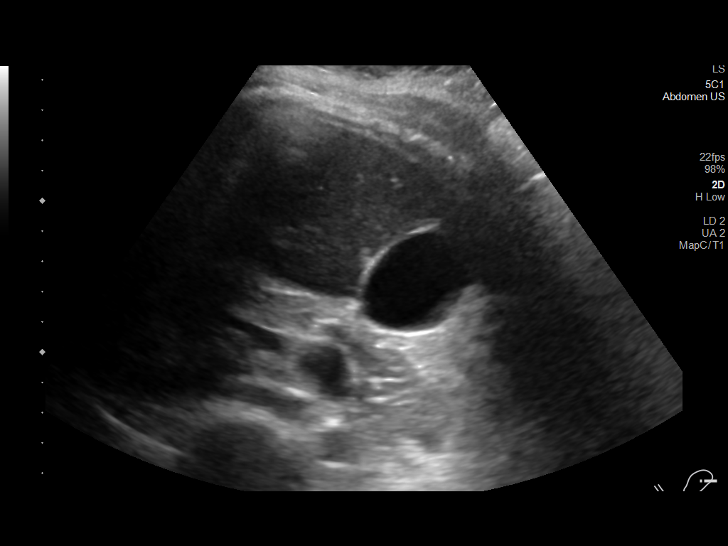
[im 27/58]
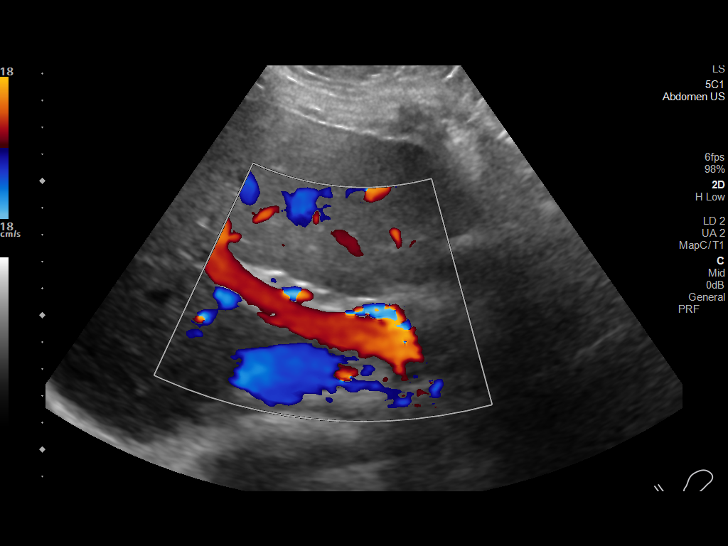
[im 31/58]
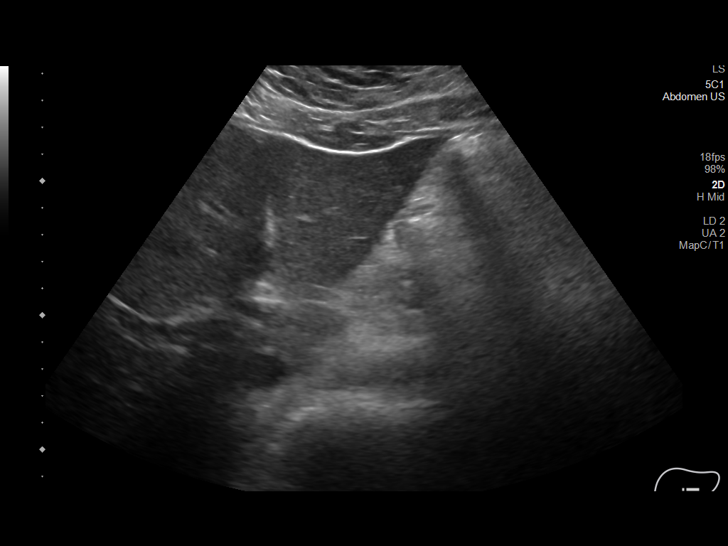
[im 36/58]
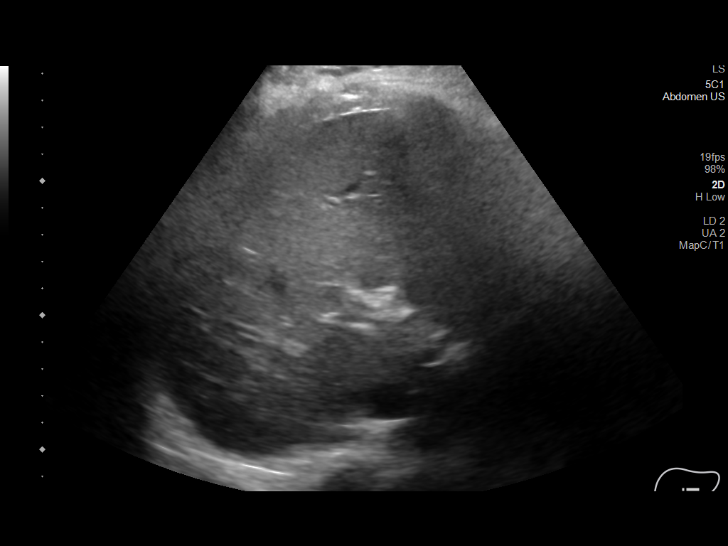
[im 39/58]
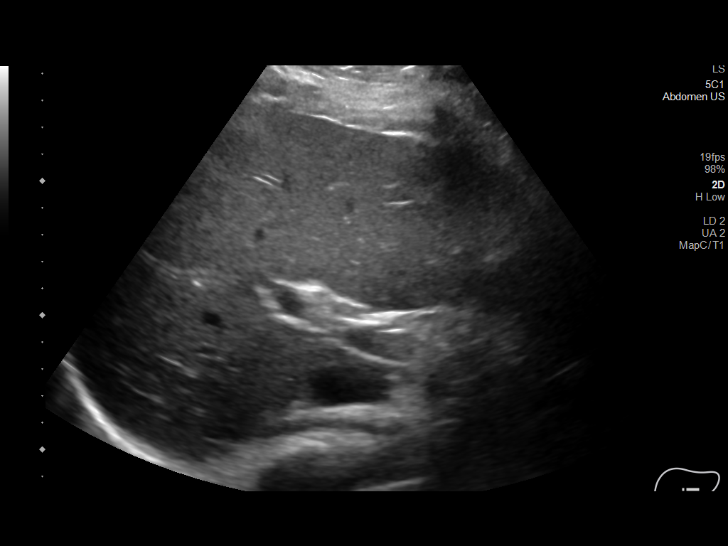
[im 43/58]
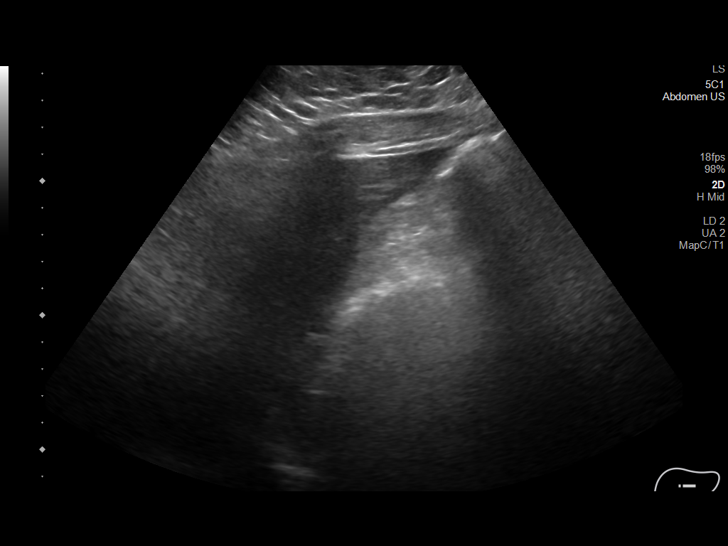
[im 48/58]
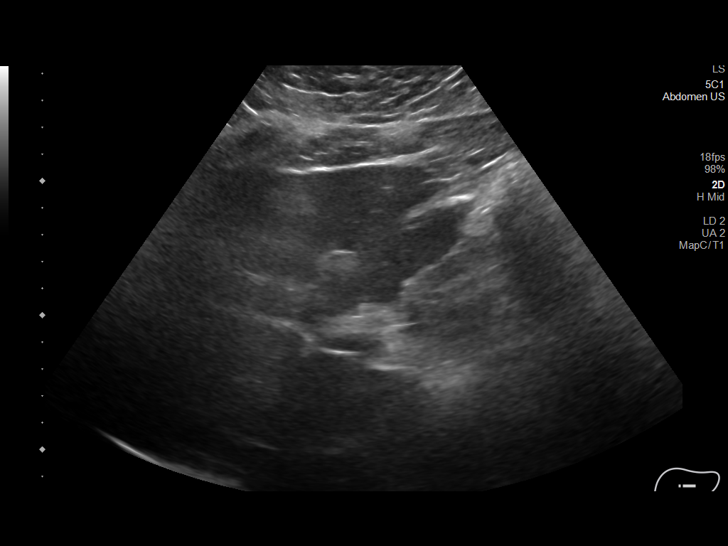
[im 53/58]
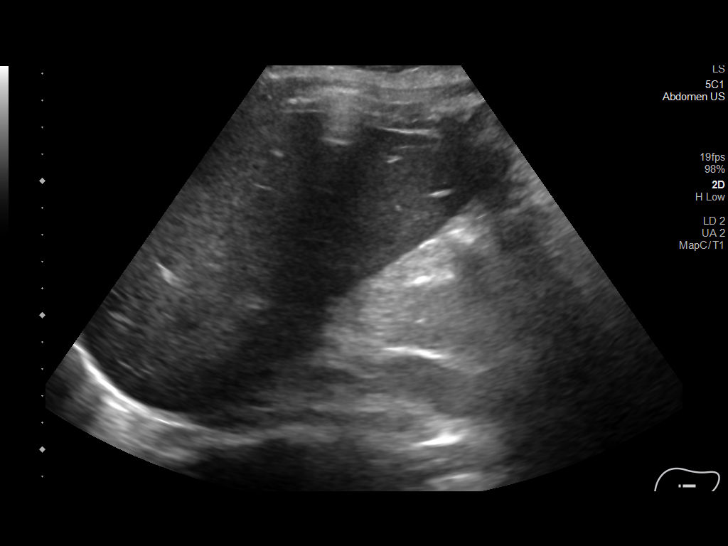
[im 58/58]
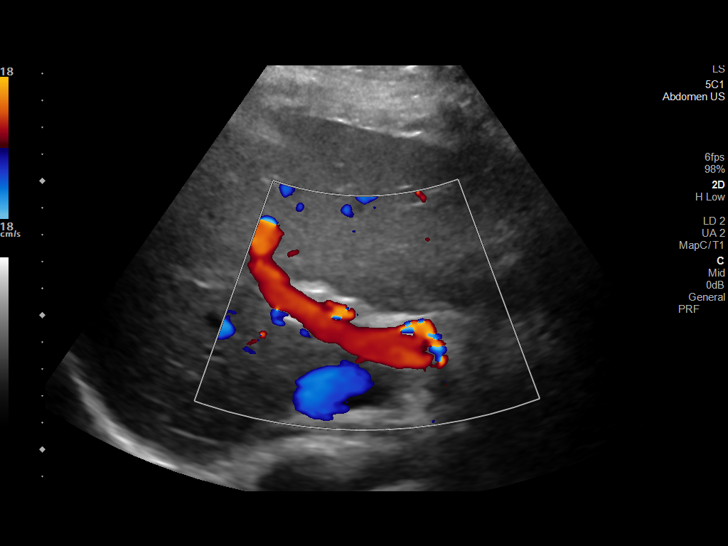

[14 of 25 positions shown; findings below may reference images not displayed]

FINDINGS: Gallbladder:

No gallstones or wall thickening visualized. No sonographic Murphy
sign noted by sonographer.

Common bile duct:

Diameter: 3.2 mm.

Liver:

No focal lesion identified. Within normal limits in parenchymal
echogenicity. Portal vein is patent on color Doppler imaging with
normal direction of blood flow towards the liver.

Other: None.
IMPRESSION: Normal right upper quadrant ultrasound.

## 2023-09-01 ENCOUNTER — Encounter: Payer: 59 | Admitting: Obstetrics & Gynecology

## 2023-09-02 ENCOUNTER — Ambulatory Visit (INDEPENDENT_AMBULATORY_CARE_PROVIDER_SITE_OTHER): Payer: 59

## 2023-09-02 ENCOUNTER — Ambulatory Visit: Payer: 59 | Attending: Obstetrics and Gynecology

## 2023-09-02 ENCOUNTER — Other Ambulatory Visit: Payer: Self-pay | Admitting: *Deleted

## 2023-09-02 VITALS — BP 126/75 | HR 93 | Wt 303.0 lb

## 2023-09-02 DIAGNOSIS — O358XX0 Maternal care for other (suspected) fetal abnormality and damage, not applicable or unspecified: Secondary | ICD-10-CM

## 2023-09-02 DIAGNOSIS — Z7184 Encounter for health counseling related to travel: Secondary | ICD-10-CM

## 2023-09-02 DIAGNOSIS — Z3A24 24 weeks gestation of pregnancy: Secondary | ICD-10-CM | POA: Diagnosis not present

## 2023-09-02 DIAGNOSIS — O99212 Obesity complicating pregnancy, second trimester: Secondary | ICD-10-CM | POA: Diagnosis not present

## 2023-09-02 DIAGNOSIS — Z362 Encounter for other antenatal screening follow-up: Secondary | ICD-10-CM | POA: Diagnosis present

## 2023-09-02 DIAGNOSIS — E669 Obesity, unspecified: Secondary | ICD-10-CM | POA: Diagnosis not present

## 2023-09-02 DIAGNOSIS — O0991 Supervision of high risk pregnancy, unspecified, first trimester: Secondary | ICD-10-CM

## 2023-09-02 NOTE — Progress Notes (Signed)
HIGH-RISK PREGNANCY OFFICE VISIT  Patient name: Maria Morgan MRN [id number]  Date of birth: [birth year] Chief Complaint:   Routine Prenatal Visit  Subjective:   Maria Morgan is a 32 y.o. G36P2002 female at [redacted]w[redacted]d with an Estimated Date of Delivery: 12/18/23 being seen today for ongoing management of a high-risk pregnancy aeb has Pituitary adenoma (HCC); Vitamin D deficiency; ADHD (attention deficit hyperactivity disorder); Malaria; Obesity, Class III, BMI 40-49.9 (morbid obesity) (HCC); Leukopenia; and Supervision of high risk pregnancy, antepartum, first trimester on their problem list.  Patient presents today, alone, with no complaints.  Patient endorses fetal movement. Patient denies abdominal cramping or contractions.  Patient denies vaginal concerns including abnormal discharge, leaking of fluid, and bleeding. No issues with urination or diarrhea. She reports having some bleeding after BM, but non in the past 3 weeks.  She reports she has increased her water intake with improvement.    Contractions: Not present. Vag. Bleeding: None.  Movement: Present.  Reviewed past medical,surgical, social, obstetrical and family history as well as problem list, medications and allergies.  Objective   Vitals:   09/02/23 1529  BP: 126/75  Pulse: 93  Weight: (!) 303 lb (137.4 kg)  Body mass index is 47.46 kg/m.  Total Weight Gain:-5 lb (-2.268 kg)         Physical Examination:   General appearance: Well appearing, and in no distress  Mental status: Alert, oriented to person, place, and time  Skin: Warm & dry  Cardiovascular: Normal heart rate noted  Respiratory: Normal respiratory effort, no distress  Abdomen: Soft, gravid, nontender, LGA with Fundal Height: 27 cm  Pelvic: Cervical exam deferred           Extremities: Edema: None  Fetal Status: Fetal Heart Rate (bpm): 147  Movement: Present   No results found for this or any previous visit (from the past 24 hour(s)).  Assessment &  Plan:  High-risk pregnancy of a 33 y.o., G3P2002 at [redacted]w[redacted]d with an Estimated Date of Delivery: 12/18/23   1. Supervision of high risk pregnancy, antepartum, first trimester -Anticipatory guidance for upcoming appts. -Patient to schedule next appt in 3-4 weeks for an in-person visit. -Reviewed Glucola appt preparation including fasting the night before and morning of.   *Informed that it is okay to drink plain water throughout the night and prior to consumption of Glucola formula.  -Discussed anticipated office time of 2.5-3 hours.  -Reviewed blood draw procedures and labs which also include check of iron/HgB level, RPR, and HIV *Informed that repeat RPR/HIV are for pediatric records/compliance.  -Discussed how results of GTT are handled including diabetic education and BS testing for abnormal results and routine care for normal results.    2. [redacted] weeks gestation of pregnancy -Doing well. -Information for fiber in diet placed in AVS   3. Obesity, Class III, BMI 40-49.9 (morbid obesity) (HCC) -TWG -5lbs -Not taking bASA. - Reviewed research and findings supporting recommendation for initiation of bASA.  Informed of goal to reduce risk of developing PreE later in pregnancy.  Reviewed PreEclampsia risk factors including AA and BMI. Patient verbalizes understanding. -Encouraged to do what she deems best for herself and baby, but wanted to educate on reasoning behind recommendation.   4. Counseling about travel -Traveling to [place] tonight for 12 days!   -Discussed air travel care including wearing compression stockings and ambulation when possible.  -Patient with concerns about Malaria.   -Informed that if symptoms arise to go to ED.  -Instructed  to get print out of PNV to have hard copy in case needed.      Meds: No orders of the defined types were placed in this encounter.  Labs/procedures today:  Lab Orders  No laboratory test(s) ordered today     Reviewed: Preterm labor  symptoms and general obstetric precautions including but not limited to vaginal bleeding, contractions, leaking of fluid and fetal movement were reviewed in detail with the patient.  All questions were answered.  Follow-up: No follow-ups on file.  No orders of the defined types were placed in this encounter.  Cherre Robins MSN, CNM 09/02/2023

## 2023-10-03 ENCOUNTER — Emergency Department (HOSPITAL_BASED_OUTPATIENT_CLINIC_OR_DEPARTMENT_OTHER)
Admission: EM | Admit: 2023-10-03 | Discharge: 2023-10-03 | Disposition: A | Discharge status: Home / Self Care | Payer: 59 | Attending: Emergency Medicine | Admitting: Emergency Medicine

## 2023-10-03 ENCOUNTER — Emergency Department (HOSPITAL_BASED_OUTPATIENT_CLINIC_OR_DEPARTMENT_OTHER): Payer: 59

## 2023-10-03 ENCOUNTER — Encounter (HOSPITAL_BASED_OUTPATIENT_CLINIC_OR_DEPARTMENT_OTHER): Payer: Self-pay

## 2023-10-03 ENCOUNTER — Other Ambulatory Visit: Payer: Self-pay

## 2023-10-03 DIAGNOSIS — O26893 Other specified pregnancy related conditions, third trimester: Secondary | ICD-10-CM | POA: Diagnosis present

## 2023-10-03 DIAGNOSIS — Z3A31 31 weeks gestation of pregnancy: Secondary | ICD-10-CM | POA: Diagnosis not present

## 2023-10-03 DIAGNOSIS — E871 Hypo-osmolality and hyponatremia: Secondary | ICD-10-CM | POA: Insufficient documentation

## 2023-10-03 DIAGNOSIS — Z7982 Long term (current) use of aspirin: Secondary | ICD-10-CM | POA: Diagnosis not present

## 2023-10-03 DIAGNOSIS — R1084 Generalized abdominal pain: Secondary | ICD-10-CM | POA: Insufficient documentation

## 2023-10-03 LAB — COMPREHENSIVE METABOLIC PANEL
ALT: 19 U/L (ref 0–44)
AST: 17 U/L (ref 15–41)
Albumin: 2.8 g/dL — ABNORMAL LOW (ref 3.5–5.0)
Alkaline Phosphatase: 52 U/L (ref 38–126)
Anion gap: 6 (ref 5–15)
BUN: 6 mg/dL (ref 6–20)
CO2: 18 mmol/L — ABNORMAL LOW (ref 22–32)
Calcium: 8.6 mg/dL — ABNORMAL LOW (ref 8.9–10.3)
Chloride: 108 mmol/L (ref 98–111)
Creatinine, Ser: 0.56 mg/dL (ref 0.44–1.00)
GFR, Estimated: >60 mL/min (ref >60)
Glucose, Bld: 106 mg/dL — ABNORMAL HIGH (ref 70–99)
Potassium: 3.5 mmol/L (ref 3.5–5.1)
Sodium: 132 mmol/L — ABNORMAL LOW (ref 135–145)
Total Bilirubin: 0.4 mg/dL (ref <1.2)
Total Protein: 6.4 g/dL — ABNORMAL LOW (ref 6.5–8.1)

## 2023-10-03 LAB — CBC WITH DIFFERENTIAL/PLATELET
Abs Immature Granulocytes: 0.01 10*3/uL (ref 0.00–0.07)
Basophils Absolute: 0 10*3/uL (ref 0.0–0.1)
Basophils Relative: 0 %
Eosinophils Absolute: 0.1 10*3/uL (ref 0.0–0.5)
Eosinophils Relative: 2 %
HCT: 35 % — ABNORMAL LOW (ref 36.0–46.0)
Hemoglobin: 12.3 g/dL (ref 12.0–15.0)
Immature Granulocytes: 0 %
Lymphocytes Relative: 23 %
Lymphs Abs: 0.9 10*3/uL (ref 0.7–4.0)
MCH: 30.1 pg (ref 26.0–34.0)
MCHC: 35.1 g/dL (ref 30.0–36.0)
MCV: 85.6 fL (ref 80.0–100.0)
Monocytes Absolute: 0.3 10*3/uL (ref 0.1–1.0)
Monocytes Relative: 8 %
Neutro Abs: 2.8 10*3/uL (ref 1.7–7.7)
Neutrophils Relative %: 67 %
Platelets: 194 10*3/uL (ref 150–400)
RBC: 4.09 MIL/uL (ref 3.87–5.11)
RDW: 11.9 % (ref 11.5–15.5)
WBC: 4 10*3/uL (ref 4.0–10.5)
nRBC: 0 % (ref 0.0–0.2)

## 2023-10-03 LAB — URINALYSIS, ROUTINE W REFLEX MICROSCOPIC
Bilirubin Urine: NEGATIVE
Glucose, UA: NEGATIVE mg/dL
Hgb urine dipstick: NEGATIVE
Ketones, ur: NEGATIVE mg/dL
Leukocytes,Ua: NEGATIVE
Nitrite: NEGATIVE
Protein, ur: NEGATIVE mg/dL
Specific Gravity, Urine: 1.025 (ref 1.005–1.030)
pH: 6 (ref 5.0–8.0)

## 2023-10-03 LAB — LIPASE, BLOOD: Lipase: 37 U/L (ref 11–51)

## 2023-10-03 MED ORDER — ONDANSETRON HCL 4 MG/2ML IJ SOLN
4.0000 mg | Freq: Once | INTRAMUSCULAR | Status: AC
  Administered 2023-10-03: 4 mg via INTRAVENOUS
  Filled 2023-10-03: qty 2

## 2023-10-03 MED ORDER — ACETAMINOPHEN 500 MG PO TABS
1000.0000 mg | ORAL_TABLET | Freq: Once | ORAL | Status: AC
  Administered 2023-10-03: 1000 mg via ORAL
  Filled 2023-10-03: qty 2

## 2023-10-03 MED ORDER — SODIUM CHLORIDE 0.9 % IV BOLUS
1000.0000 mL | Freq: Once | INTRAVENOUS | Status: AC
  Administered 2023-10-03: 1000 mL via INTRAVENOUS

## 2023-10-03 NOTE — Progress Notes (Signed)
Notified Dr Debroah Loop about pt.  Md made aware of reactive NST for gestation with no UCs being traced.  MD says that pt may be OB cleared at this time.

## 2023-10-03 NOTE — Progress Notes (Signed)
[facility] nurse notified of pt who presented to [facility] as G3P2 at 29.1wks with abdominal pains/UCs x2 weeks.  Pt reports trying to contact her OBGYN with no response.  She denies LOF or vaginal bleeding but says she has had some rectal bleeding.  She also endorses positive fetal movement.  [facility] placing pt on monitor.  [facility] will monitor remotely.

## 2023-10-03 NOTE — Progress Notes (Signed)
[facility] made aware that pt has ben OB cleared.  They will instruct patient to reach out to her OB again should she continue to have concerns.

## 2023-10-03 NOTE — ED Notes (Signed)
Pt eating and drinking at this time. 

## 2023-10-03 NOTE — ED Triage Notes (Addendum)
Pt reports pain and contractions on and off for the past 2 weeks. States that she has called her OB but they did not call her back. Denies vaginal bleeding or leakage.  Pt expressed that she doesn't feel safe at home due to her have some legal issues going on.

## 2023-10-03 NOTE — Discharge Instructions (Signed)
Follow-up with your OB/GYN as scheduled.  If you develop any new or worsening symptoms you should go to [facility], ED where they have the obstetric emergency department.  Make sure you are drinking plenty of fluids.  You can take Tylenol for pain.

## 2023-10-03 NOTE — ED Notes (Signed)
Per OB RR, has been cleared from fetal monitoring . EDP informed

## 2023-10-03 NOTE — ED Provider Notes (Signed)
[facility] Provider Note   CSN: [id number] Arrival date & time: 10/03/23  4098     History  Chief Complaint  Patient presents with   Abdominal Pain    Maria Morgan is a 32 y.o. female.  HPI 32 year old female G3 P2-0-0-2 currently [redacted] weeks pregnant presenting for abdominal pain.  Patient states for 3 weeks she has had intermittent cramping sensation in her lower abdomen and contractions.  She has had some nausea and dizziness as well.  She is eating and drinking home, no vomiting or diarrhea.  Normal bowel movements.  No urinary symptoms.  No vaginal bleeding or leakage of fluid.  Normal fetal movement.  No vaginal discharge.  No fevers or chills.  Symptoms are not worse today but are making it difficult for her to work and she has not been able to get a hold of her OB to schedule closer follow-up so she presents for evaluation.     Home Medications Prior to Admission medications   Medication Sig Start Date End Date Taking? Authorizing Provider  aspirin EC 81 MG tablet Take 1 tablet (81 mg total) by mouth daily. Start taking when you are [redacted] weeks pregnant for rest of pregnancy for prevention of preeclampsia Patient not taking: Reported on 08/01/2023 06/03/23   Sue Lush, FNP  chloroquine (ARALEN) 500 MG tablet Take 1 Tablet by mouth Every seven days Begin one week prior to travel. Take on same day of week during travel. Continue for four weeks after travel. 08/26/23     doxylamine, Sleep, (UNISOM) 25 MG tablet Take 0.5 tablets (12.5 mg total) by mouth every 8 (eight) hours as needed for up to 5 days. 04/08/23 04/13/23  Gowens, Mariah L, PA-C  folic acid (FOLVITE) 1 MG tablet Take 1 mg by mouth daily. Patient not taking: Reported on 09/02/2023    [provider]  Prenatal Vit-Fe Fumarate-FA (PRENATAL VITAMIN) 27-0.8 MG TABS Take 1 capsule by mouth daily. Patient not taking: Reported on 09/02/2023 05/28/23   Jacalyn Lefevre, MD  valACYclovir Ralph Dowdy) 1000 MG tablet  02/25/22   [provider]      Allergies    Iodine    Review of Systems   Review of Systems Review of systems completed and notable as per HPI.  ROS otherwise negative.   Physical Exam Updated Vital Signs BP 118/77   Pulse 73   Temp 98.4 F (36.9 C)   Resp 18   Wt 131.5 kg   LMP 03/10/2023 (Approximate)   SpO2 98%   BMI 45.42 kg/m  Physical Exam Vitals and nursing note reviewed.  Constitutional:      General: She is not in acute distress.    Appearance: She is well-developed.  HENT:     Head: Normocephalic and atraumatic.  Eyes:     Conjunctiva/sclera: Conjunctivae normal.  Cardiovascular:     Rate and Rhythm: Normal rate and regular rhythm.     Pulses: Normal pulses.     Heart sounds: Normal heart sounds. No murmur heard. Pulmonary:     Effort: Pulmonary effort is normal. No respiratory distress.     Breath sounds: Normal breath sounds.  Abdominal:     Palpations: Abdomen is soft.     Tenderness: There is abdominal tenderness in the suprapubic area. There is no right CVA tenderness, left CVA tenderness, guarding or rebound.  Musculoskeletal:        General: No swelling.     Cervical  back: Neck supple.     Right lower leg: No edema.     Left lower leg: No edema.  Skin:    General: Skin is warm and dry.     Capillary Refill: Capillary refill takes less than 2 seconds.  Neurological:     General: No focal deficit present.     Mental Status: She is alert and oriented to person, place, and time. Mental status is at baseline.  Psychiatric:        Mood and Affect: Mood normal.     ED Results / Procedures / Treatments   Labs (all labs ordered are listed, but only abnormal results are displayed) Labs Reviewed  COMPREHENSIVE METABOLIC PANEL - Abnormal; Notable for the following components:      Result Value   Sodium 132 (*)    CO2 18 (*)    Glucose, Bld 106 (*)    Calcium 8.6 (*)    Total Protein  6.4 (*)    Albumin 2.8 (*)    All other components within normal limits  CBC WITH DIFFERENTIAL/PLATELET - Abnormal; Notable for the following components:   HCT 35.0 (*)    All other components within normal limits  LIPASE, BLOOD  URINALYSIS, ROUTINE W REFLEX MICROSCOPIC    EKG None  Radiology US OB Limited Result Date: 10/03/2023 CLINICAL DATA:  Intermittent contractions for 2 weeks, nausea, abdominal pain EXAM: LIMITED OBSTETRIC ULTRASOUND COMPARISON:  09/02/2023 FINDINGS: Number of Fetuses: 1 Heart Rate:  129 bpm Movement: Yes Presentation: Cephalic Placental Location: Posterior Previa: No Amniotic Fluid (Subjective):  Decreased FL:: 5.9 cm 31 w  0 d MATERNAL FINDINGS: Cervix:  Appears closed. Uterus/Adnexae: No abnormality visualized. IMPRESSION: 1. Single live intrauterine pregnancy as above estimated age 46 weeks and 0 days. 2. Subjectively decreased amniotic fluid volume. This exam is performed on an emergent basis and does not comprehensively evaluate fetal size, dating, or anatomy; follow-up complete OB US should be considered if further fetal assessment is warranted. Electronically Signed   By: Sharlet Salina M.D.   On: 10/03/2023 10:59    Procedures Procedures    Medications Ordered in ED Medications  sodium chloride 0.9 % bolus 1,000 mL (0 mLs Intravenous Stopped 10/03/23 1232)  ondansetron (ZOFRAN) injection 4 mg (4 mg Intravenous Given 10/03/23 1034)  acetaminophen (TYLENOL) tablet 1,000 mg (1,000 mg Oral Given 10/03/23 1035)    ED Course/ Medical Decision Making/ A&P Clinical Course as of 10/03/23 1245  Fri Oct 03, 2023  0944 Rh + [JD]  12[address] [place]: keep appt 12/27, come to [facility] if other issues [JD]    Clinical Course User Index [JD] Laurence Spates, MD                                 Medical Decision Making Amount and/or Complexity of Data Reviewed Labs: ordered. Radiology: ordered.  Risk OTC drugs. Prescription drug management.   Medical Decision  Making:   Maria Morgan is a 32 y.o. female who presented to the ED today with 2 to 3 weeks of lower abdominal pain and contractions.  She also has some dizziness and nausea.  Here she is well-appearing, hemodynamically stable.  She is some mild lower abdominal tenderness mostly suprapubic and pain has been vomiting for couple weeks.  I suspect this is related to her pregnancy, she is no focal right lower quadrant tenderness or fever low suspicion for appendicitis.  Seems less consistent  with torsion as well.  No vaginal bleeding or discharge or leakage of fluid to suggest STI or bleeding.  Obtain ultrasound evaluate fetus, she is on tocometry here.   Patient placed on continuous vitals and telemetry monitoring while in ED which was reviewed periodically.  Reviewed and confirmed nursing documentation for past medical history, family history, social history.  Reassessment and Plan:   Lab was notable for mild hyponatremia and low bicarb although normal anion gap, however this is quite similar to her prior labs.  She also has some low albumin.  CBC is unremarkable.  Urinalysis unremarkable.  LFTs are normal.  Lipase is normal.  OB ultrasound she has normal heart rate, no acute findings other than subjectively decreased amniotic fluid.  I talked with Dr. Berton Lan with OB/GYN who reviewed patient's history, presentation and workup.  She feels it is real for patient to follow-up as scheduled in a week, and recommend patient go to the maternal assessment unit if her symptoms worsen.  On reassessment patient is feeling significantly better.  She tolerating p.o.  She feels comfortable plan to go home.  I gave her strict turn precautions for localizing right lower quadrant pain, fever, persistent vomiting, vaginal bleeding, leakage of fluid or discharge or any other new concerning symptoms.  She is comfortable this plan.  Encouraged to take the weekend off from work and hydrate.   Patient's presentation is most  consistent with acute complicated illness / injury requiring diagnostic workup.           Final Clinical Impression(s) / ED Diagnoses Final diagnoses:  Generalized abdominal pain    Rx / DC Orders ED Discharge Orders     None         Laurence Spates, MD 10/03/23 1245

## 2023-10-03 NOTE — ED Notes (Signed)
US at bedside

## 2023-10-10 ENCOUNTER — Encounter: Payer: Self-pay | Admitting: Obstetrics & Gynecology

## 2023-10-10 ENCOUNTER — Ambulatory Visit (INDEPENDENT_AMBULATORY_CARE_PROVIDER_SITE_OTHER): Payer: 59 | Admitting: Obstetrics & Gynecology

## 2023-10-10 VITALS — BP 127/78 | HR 91 | Wt 303.0 lb

## 2023-10-10 DIAGNOSIS — O99213 Obesity complicating pregnancy, third trimester: Secondary | ICD-10-CM

## 2023-10-10 DIAGNOSIS — Z3A30 30 weeks gestation of pregnancy: Secondary | ICD-10-CM

## 2023-10-10 DIAGNOSIS — Z23 Encounter for immunization: Secondary | ICD-10-CM | POA: Diagnosis not present

## 2023-10-10 DIAGNOSIS — O0993 Supervision of high risk pregnancy, unspecified, third trimester: Secondary | ICD-10-CM

## 2023-10-10 NOTE — Progress Notes (Signed)
PRENATAL VISIT NOTE  Subjective:  Maria Morgan is a 32 y.o. G3P2002 at [redacted]w[redacted]d being seen today for ongoing prenatal care.  She is currently monitored for the following issues for this high-risk pregnancy and has Pituitary adenoma (HCC); Vitamin D deficiency; ADHD (attention deficit hyperactivity disorder); Maternal morbid obesity, antepartum (HCC); Leukopenia; and Supervision of high risk pregnancy, antepartum, third trimester on their problem list.  Patient reports no complaints.  Contractions: Irritability. Vag. Bleeding: None.  Movement: Present. Denies leaking of fluid.   The following portions of the patient's history were reviewed and updated as appropriate: allergies, current medications, past family history, past medical history, past social history, past surgical history and problem list.   Objective:   Vitals:   10/10/23 1010  BP: 127/78  Pulse: 91  Weight: (!) 303 lb (137.4 kg)    Fetal Status: Fetal Heart Rate (bpm): 132   Movement: Present     General:  Alert, oriented and cooperative. Patient is in no acute distress.  Skin: Skin is warm and dry. No rash noted.   Cardiovascular: Normal heart rate noted  Respiratory: Normal respiratory effort, no problems with respiration noted  Abdomen: Soft, gravid, appropriate for gestational age.  Pain/Pressure: Absent     Pelvic: Cervical exam deferred        Extremities: Normal range of motion.  Edema: Trace  Mental Status: Normal mood and affect. Normal behavior. Normal judgment and thought content.   Imaging: US OB Limited Result Date: 10/03/2023 CLINICAL DATA:  Intermittent contractions for 2 weeks, nausea, abdominal pain EXAM: LIMITED OBSTETRIC ULTRASOUND COMPARISON:  09/02/2023 FINDINGS: Number of Fetuses: 1 Heart Rate:  129 bpm Movement: Yes Presentation: Cephalic Placental Location: Posterior Previa: No Amniotic Fluid (Subjective):  Decreased FL:: 5.9 cm 31 w  0 d MATERNAL FINDINGS: Cervix:  Appears closed.  Uterus/Adnexae: No abnormality visualized. IMPRESSION: 1. Single live intrauterine pregnancy as above estimated age 18 weeks and 0 days. 2. Subjectively decreased amniotic fluid volume. This exam is performed on an emergent basis and does not comprehensively evaluate fetal size, dating, or anatomy; follow-up complete OB US should be considered if further fetal assessment is warranted. Electronically Signed   By: Sharlet Salina M.D.   On: 10/03/2023 10:59   [place] MFM OB FOLLOW UP Result Date: 09/02/2023 ----------------------------------------------------------------------  OBSTETRICS REPORT                       (Signed Final 09/02/2023 11:29 am) ---------------------------------------------------------------------- Patient Info  ID #:       [id number]                          D.O.B.:  10/17/1990 (32 yrs)  Name:       Maria Morgan                 Visit Date: 09/02/2023 09:48 am ---------------------------------------------------------------------- Performed By  Attending:        Noralee Space MD        Ref. Address:     [address]                                                             [address]  Performed By:     Eden Lathe BS  [facility] RVT                                 [facility]  Referred By:      [facility] [place] ---------------------------------------------------------------------- Orders  #  Description                           Code        Ordered By  1  [place] MFM OB FOLLOW UP                   16109.60    Noralee Space ----------------------------------------------------------------------  #  Order #                     Accession #                Episode #  1  [id number]                   [id number]                 [id number] ---------------------------------------------------------------------- Indications   [redacted] weeks gestation of pregnancy                Z3A.24  Obesity complicating pregnancy, second         O99.212  trimester (PBMI 48)  Fetal or maternal indication (Leukopenia,      O35.8XX0  Malaria, Pituitary adenoma)  Insufficient DNA, neg.CF 2017  Antenatal follow-up for nonvisualized fetal    Z36.2  anatomy ---------------------------------------------------------------------- Fetal Evaluation  Num Of Fetuses:         1  Fetal Heart Rate(bpm):  144  Cardiac Activity:       Observed  Presentation:           Breech  Placenta:               Anterior  P. Cord Insertion:      Previously seen  Amniotic Fluid  AFI FV:      Within normal limits                              Largest Pocket(cm)                              5.97 ---------------------------------------------------------------------- Biometry  BPD:      60.5  mm  G. Age:  24w 4d         39  %    CI:        65.68   %    70 - 86                                                          FL/HC:      20.0   %    18.7 - 20.3  HC:      239.6  mm     G. Age:  26w 0d         77  %    HC/AC:      1.09        1.04 - 1.22  AC:      220.4  mm     G. Age:  26w 3d         89  %    FL/BPD:     79.2   %    71 - 87  FL:       47.9  mm     G. Age:  26w 0d         77  %    FL/AC:      21.7   %    20 - 24  CER:      28.8  mm     G. Age:  25w 3d         73  %  LV:        5.6  mm  Est. FW:     901  gm           2 lb     94  % ---------------------------------------------------------------------- OB History  Gravidity:    3         Term:   2  Living:       2 ---------------------------------------------------------------------- Gestational Age  LMP:           25w 1d        Date:  03/10/23                  EDD:   12/15/23  U/S Today:     25w 5d                                        EDD:   12/11/23  Best:          24w 5d     Det. By:  U/S C R L  (04/30/23)    EDD:   12/18/23 ---------------------------------------------------------------------- Targeted Anatomy  Central Nervous System   Calvarium/Cranial V.:  Appears normal         Cereb./Vermis:          Appears normal  Cavum:                 Appears normal         Cisterna Magna:         Appears normal  Lateral Ventricles:    Appears normal         Midline Falx:           Appears normal  Choroid Plexus:  Appears normal  Spine  Cervical:              Previously seen        Sacral:                 Previously seen  Thoracic:              Previously seen        Shape/Curvature:        Previously seen  Lumbar:                Previously seen  Head/Neck  Lips:                  Appears normal         Profile:                Appears normal  Neck:                  Previously seen        Orbits/Eyes:            Previously seen  Nuchal Fold:           Not applicable         Mandible:               Appears normal  Nasal Bone:            Present                Maxilla:                Appears normal  Thorax  4 Chamber View:        Not well visualized    Interventr. Septum:     Not well visualized  Cardiac Rhythm:        Normal                 Cardiac Axis:           Appears normal  Cardiac Situs:         Appears normal         Diaphragm:              Appears normal  Rt Outflow Tract:      Appears normal         3 Vessel View:          Appears normal  Lt Outflow Tract:      Appears normal         3 V Trachea View:       Appears normal  Aortic Arch:           Previously seen        IVC:                    Appears normal  Ductal Arch:           Not well visualized    Crossing:               Appears normal  SVC:                   Appears normal  Abdomen  Ventral Wall:          Previously seen        Lt Kidney:              Appears normal  Cord Insertion:  Previously seen        Rt Kidney:              Appears normal  Situs:                 Appears normal         Bladder:                Appears normal  Stomach:               Appears normal  Extremities  Lt Humerus:            Previously seen        Lt Femur:               Previously seen  Rt Humerus:             Previously seen        Rt Femur:               Previously seen  Lt Forearm:            Previously seen        Lt Lower Leg:           Previously seen  Rt Forearm:            Previously seen        Rt Lower Leg:           Previously seen  Lt Hand:               Not well visualized    Lt Foot:                Previously seen  Rt Hand:               Open hand nml          Rt Foot:                Previously seen  Other  Umbilical Cord:        Previously seen        Genitalia:              Previously seen  Comment:     Technically difficult due to maternal habitus and fetal position. ---------------------------------------------------------------------- Cervix Uterus Adnexa  Cervix  Not visualized (advanced GA >24wks)  Uterus  No abnormality visualized.  Right Ovary  Within normal limits.  Left Ovary  Within normal limits.  Cul De Sac  No free fluid seen.  Adnexa  No abnormality visualized ---------------------------------------------------------------------- Impression  Patient returned for completion of fetal anatomy .Amniotic  fluid is normal and good fetal activity is seen .Fetal biometry  is consistent with her previously-established dates .Fetal  anatomical survey was completed (except 4-chamber view,  ductal arch and left hand) and appears normal. ---------------------------------------------------------------------- Recommendations  -An appointment was made for her to return in 6 weeks for  fetal growth assessment. ----------------------------------------------------------------------                  Noralee Space, MD Electronically Signed Final Report   09/02/2023 11:29 am ----------------------------------------------------------------------   [place] MFM OB DETAIL +14 WK Result Date: 08/01/2023 ----------------------------------------------------------------------  OBSTETRICS REPORT                       (Signed Final 08/01/2023 11:09 am)  ---------------------------------------------------------------------- Patient Info  ID #:       [id number]  D.O.B.:  1991-08-15 (32 yrs)  Name:       JENNIA KIEL                 Visit Date: 08/01/2023 08:23 am ---------------------------------------------------------------------- Performed By  Attending:        Noralee Space MD        Ref. Address:     [address]                                                             [place], [place]                                                             [address]  Performed By:     Alain Marion     Location[facility]                    RD[facility]                                     [facility]  Referred By:      [facility] ---------------------------------------------------------------------- Orders  #  Description                           Code        Ordered By  1  [place] MFM OB DETAIL +14 WK               76811.01    [facility] ----------------------------------------------------------------------  #  Order #                     Accession #                Episode #  1  [id number]                   [id number]                 [id number] ---------------------------------------------------------------------- Indications  Obesity complicating pregnancy, second         [id number]  trimester (PBMI 48)  Fetal or maternal indication (Leukopenia,      O35.8XX0  Malaria, Pituitary adenoma)  Insufficient DNA, neg.CF 2017  Encounter for antenatal screening for          Z36.3  malformations  [redacted] weeks gestation of pregnancy                Z3A.20 ---------------------------------------------------------------------- Fetal Evaluation  Num Of Fetuses:         1  Fetal Heart Rate(bpm):  143  Cardiac Activity:       Observed  Presentation:           Transverse, head to maternal  left  Placenta:               Anterior  P. Cord Insertion:      Visualized  Amniotic Fluid  AFI FV:      Within normal limits                              Largest Pocket(cm)                              4.88 ---------------------------------------------------------------------- Biometry  BPD:      47.8  mm     G. Age:  20w 3d         63  %    CI:        69.01   %    70 - 86                                                          FL/HC:      18.1   %    16.8 - 19.8  HC:      183.8  mm     G. Age:  20w 5d         70  %    HC/AC:      1.20        1.09 - 1.39  AC:      153.6  mm     G. Age:  20w 4d         58  %    FL/BPD:     69.7   %  FL:       33.3  mm     G. Age:  20w 3d         52  %    FL/AC:      21.7   %    20 - 24  HUM:      34.2  mm     G. Age:  21w 5d         92  %  CER:      20.6  mm     G. Age:  19w 5d         57  %  LV:        5.6  mm  CM:        3.9  mm  Est. FW:     359  gm    0 lb 13 oz      67  % ---------------------------------------------------------------------- OB History  Gravidity:  3         Term:   2  Living:       2 ---------------------------------------------------------------------- Gestational Age  LMP:           20w 4d        Date:  03/10/23                  EDD:   12/15/23  U/S Today:     20w 4d                                        EDD:   12/15/23  Best:          20w 1d     Det. By:  U/S C R L  (04/30/23)    EDD:   12/18/23 ---------------------------------------------------------------------- Targeted Anatomy  Central Nervous System  Calvarium/Cranial V.:  Appears normal         Cereb./Vermis:          Appears normal  Cavum:                 Appears normal         Cisterna Magna:         Appears normal  Lateral Ventricles:    Not well visualized    Midline Falx:           Not well visualized  Choroid Plexus:        Appears normal  Spine  Cervical:              Appears normal         Sacral:                 Appears normal  Thoracic:              Appears normal         Shape/Curvature:         Appears normal  Lumbar:                Appears normal  Head/Neck  Lips:                  Not well visualized    Profile:                Appears normal  Neck:                  Appears normal         Orbits/Eyes:            Appears normal  Nuchal Fold:           Not applicable         Mandible:               Not well visualized  Nasal Bone:            Present                Maxilla:                Not well visualized  Thorax  4 Chamber View:        Not well visualized    Interventr. Septum:     Not well visualized  Cardiac Rhythm:        Normal  Cardiac Axis:           Not well visualized  Cardiac Situs:         Not well visualized    Diaphragm:              Appears normal  Rt Outflow Tract:      Not well visualized    3 Vessel View:          Not well visualized  Lt Outflow Tract:      Not well visualized    3 V Trachea View:       Not well visualized  Aortic Arch:           Appears normal         IVC:                    Not well visualized  Ductal Arch:           Not well visualized    Crossing:               Not well visualized  SVC:                   Not well visualized  Abdomen  Ventral Wall:          Appears normal         Lt Kidney:              Appears normal  Cord Insertion:        Appears normal         Rt Kidney:              Appears normal  Situs:                 Appears normal         Bladder:                Appears normal  Stomach:               Appears normal  Extremities  Lt Humerus:            Appears normal         Lt Femur:               Appears normal  Rt Humerus:            Appears normal         Rt Femur:               Appears normal  Lt Forearm:            Appears normal         Lt Lower Leg:           Appears normal  Rt Forearm:            Appears normal         Rt Lower Leg:           Appears normal  Lt Hand:               Not well visualized    Lt Foot:                Nml heel/foot  Rt Hand:               Clenched               Rt Foot:  Nml heel/foot  Other   Umbilical Cord:        Normal 3-vessel        Genitalia:              Female-nml  Comment:     Technically difficult due to maternal habitus and fetal position. ---------------------------------------------------------------------- Cervix Uterus Adnexa  Cervix  Length:            4.7  cm.  Normal appearance by transabdominal scan  Uterus  No abnormality visualized.  Right Ovary  Size(cm)     3.19   x   2.19   x  2.44      Vol(ml): 8.93  Within normal limits.  Left Ovary  Size(cm)     3.28   x   2.48   x  1.93      Vol(ml): 8.22  Within normal limits.  Cul De Sac  No free fluid seen.  Adnexa  No abnormality visualized ---------------------------------------------------------------------- Impression  G3 P2002.  Patient is here for fetal anatomy scan.  On cell-  free fetal DNA screening, fetal DNA was insufficient, and the  results could not be reported.  Obstetrical history significant for 2 term vaginal deliveries.  Patient gives history of pituitary tumor.  Recent MRI showed  no intracranial pathology.  We performed fetal anatomy scan. No makers of  aneuploidies or fetal structural defects are seen. Fetal  anatomical survey could not be completed because of fetal  position. Fetal biometry is consistent with her previously-  established dates. Amniotic fluid is normal and good fetal  activity is seen. Patient understands the limitations of  ultrasound in detecting fetal anomalies.  As maternal obesity imposes limitations on the resolution of  images, fetal anomalies may be missed. ---------------------------------------------------------------------- Recommendations  -An appointment was made for her to return in 5 weeks for  completion of fetal anatomy.  -Consider genetic counseling discuss cell-free fetal DNA  screening results. ----------------------------------------------------------------------                  Noralee Space, MD Electronically Signed Final Report   08/01/2023 11:09 am  ----------------------------------------------------------------------    Assessment and Plan:  Pregnancy: [id number] at [redacted]w[redacted]d 1. Maternal morbid obesity, antepartum (HCC) TWG -5 lbs Morbid Obesity (Pregravid BMI >=40) Q4w @ 24w 34w 39.0-40.6w  - [place] MFM FETAL BPP WO NON STRESS; Future ordered Next growth scan scheduled 10/14/23  2. [redacted] weeks gestation of pregnancy 3. Supervision of high risk pregnancy, antepartum, third trimester (Primary) Third trimester labs and Tdap today. - Glucose Tolerance, 2 Hours w/1 Hour - CBC - RPR - HIV Antibody (routine testing w rflx) - Tdap vaccine greater than or equal to 7yo IM Given information about RSV vaccine. Preterm labor symptoms and general obstetric precautions including but not limited to vaginal bleeding, contractions, leaking of fluid and fetal movement were reviewed in detail with the patient. Please refer to After Visit Summary for other counseling recommendations.   Return in about 2 weeks (around 10/24/2023) for OFFICE OB VISIT (MD or APP).  Future Appointments  Date Time Provider Department Center  10/14/2023  9:15 AM [facility] NURSE [facility]  10/14/2023  9:30 AM [facility] US5 [facility]  10/23/2023 10:35 AM Levie Heritage, DO [facility] None  11/03/2023  9:15 AM Donta Mcinroy, Jethro Bastos, MD [facility] None  11/17/2023  9:15 AM Celedonio Savage, MD [facility] None  12/03/2023  9:15 AM Levie Heritage, DO [facility] None    Jaynie Collins, MD

## 2023-10-10 NOTE — Patient Instructions (Signed)
TDaP Vaccine Pregnancy Get the Whooping Cough Vaccine While You Are Pregnant (CDC)  It is important for women to get the whooping cough vaccine in the third trimester of each pregnancy. Vaccines are the best way to prevent this disease. There are 2 different whooping cough vaccines. Both vaccines combine protection against whooping cough, tetanus and diphtheria, but they are for different age groups: Tdap: for everyone 11 years or older, including pregnant women  DTaP: for children 2 months through 52 years of age  You need the whooping cough vaccine during each of your pregnancies The recommended time to get the shot is during your 27th through 36th week of pregnancy, preferably during the earlier part of this time period. The Centers for Disease Control and Prevention (CDC) recommends that pregnant women receive the whooping cough vaccine for adolescents and adults (called Tdap vaccine) during the third trimester of each pregnancy. The recommended time to get the shot is during your 27th through 36th week of pregnancy, preferably during the earlier part of this time period. This replaces the original recommendation that pregnant women get the vaccine only if they had not previously received it. The [employer] of [employer]stetricians and Gynecologists and the [employer] support this recommendation.  You should get the whooping cough vaccine while pregnant to pass protection to your baby frame support disabled and/or not supported in this browser  Learn why Vernona Rieger decided to get the whooping cough vaccine in her 3rd trimester of pregnancy and how her baby girl was born with some protection against the disease. Also available on YouTube. After receiving the whooping cough vaccine, your body will create protective antibodies (proteins produced by the body to fight off diseases) and pass some of them to your baby before birth. These antibodies provide your baby some short-term  protection against whooping cough in early life. These antibodies can also protect your baby from some of the more serious complications that come along with whooping cough. Your protective antibodies are at their highest about 2 weeks after getting the vaccine, but it takes time to pass them to your baby. So the preferred time to get the whooping cough vaccine is early in your third trimester. The amount of whooping cough antibodies in your body decreases over time. That is why CDC recommends you get a whooping cough vaccine during each pregnancy. Doing so allows each of your babies to get the greatest number of protective antibodies from you. This means each of your babies will get the best protection possible against this disease.  Getting the whooping cough vaccine while pregnant is better than getting the vaccine after you give birth Whooping cough vaccination during pregnancy is ideal so your baby will have short-term protection as soon as he is born. This early protection is important because your baby will not start getting his whooping cough vaccines until he is 2 months old. These first few months of life are when your baby is at greatest risk for catching whooping cough. This is also when he's at greatest risk for having severe, potentially life-threating complications from the infection. To avoid that gap in protection, it is best to get a whooping cough vaccine during pregnancy. You will then pass protection to your baby before he is born. To continue protecting your baby, he should get whooping cough vaccines starting at 2 months old. You may never have gotten the Tdap vaccine before and did not get it during this pregnancy. If so, you should  make sure to get the vaccine immediately after you give birth, before leaving the hospital or birthing center. It will take about 2 weeks before your body develops protection (antibodies) in response to the vaccine. Once you have protection from the vaccine,  you are less likely to give whooping cough to your newborn while caring for him. But remember, your baby will still be at risk for catching whooping cough from others. A recent study looked to see how effective Tdap was at preventing whooping cough in babies whose mothers got the vaccine while pregnant or in the hospital after giving birth. The study found that getting Tdap between 27 through 36 weeks of pregnancy is 85% more effective at preventing whooping cough in babies younger than 2 months old. Blood tests cannot tell if you need a whooping cough vaccine There are no blood tests that can tell you if you have enough antibodies in your body to protect yourself or your baby against whooping cough. Even if you have been sick with whooping cough in the past or previously received the vaccine, you still should get the vaccine during each pregnancy. Breastfeeding may pass some protective antibodies onto your baby By breastfeeding, you may pass some antibodies you have made in response to the vaccine to your baby. When you get a whooping cough vaccine during your pregnancy, you will have antibodies in your breast milk that you can share with your baby as soon as your milk comes in. However, your baby will not get protective antibodies immediately if you wait to get the whooping cough vaccine until after delivering your baby. This is because it takes about 2 weeks for your body to create antibodies. Learn more about the health benefits of breastfeeding.   RSV Vaccination for Pregnant People  CDC recommends two ways to protect babies from getting very sick with Respiratory Syncytial Virus (RSV):  An RSV vaccination given during pregnancy  Pfizer's vaccine Verdis Frederickson) is recommended for use during pregnancy. It is given during RSV season to people who are 32 through [redacted] weeks pregnant.  Or, An RSV immunization given directly to infants and some older babies  Babies born to mothers who get RSV vaccine at  least 2 weeks before delivery will have protection and, in most cases, should not need an RSV immunization later.    When is RSV season?  In most regions of the [place] RSV season starts in the fall and peaks in the winter, but the timing and severity of RSV season can vary from place to place and year to year.   The goal of maternal RSV vaccination is to protect babies from getting very sick with RSV during their first RSV season.  In most of the [place], this means maternal RSV vaccine will be given in September through January.  Who should get the maternal RSV vaccine?  People who are 53 through [redacted] weeks pregnant during September through January should get one dose of maternal RSV vaccine to protect their babies. RSV season can vary around the country.   How is the maternal RSV vaccine administered?  Maternal RSV vaccine is given as a shot into the mother's upper arm. Only a single dose (one shot) of maternal RSV vaccine is recommended.   It is not yet known whether another dose might be needed in later pregnancies.  How well does the maternal RSV vaccine work?  When someone gets RSV vaccine, their body responds by making a protein that protects against  the virus that causes RSV. The process takes about 2 weeks. When a pregnant person gets RSV vaccine, their protective proteins (called antibodies) also pass to their baby. So, babies who are born at least 2 weeks after their mother gets RSV vaccine are protected at birth, when infants are at the highest risk of severe RSV disease.   The vaccine can reduce a baby's risk of being hospitalized from RSV by 57% in the first six months after birth.  What are the possible side effects of the maternal RSV vaccine?  In the clinical trials, the side effects most often reported by pregnant people who received the maternal RSV vaccine were pain at the injection site, headache, muscle pain, and nausea.  Although not  common, a dangerous high blood pressure condition called pre-eclampsia occurred in 1.8% of pregnant people who received the maternal RSV vaccine compared to 1.4% of pregnant people who received a placebo.  The clinical trials identified a small increase in the number of preterm births in vaccinated pregnant people. It is not clear if this is a true safety problem related to RSV vaccine or if this occurred for reasons unrelated to vaccination.  To reduce the potential risk of preterm birth and complications from RSV disease, FDA approved the maternal RSV vaccine for use during weeks 32 through 13 of pregnancy while additional studies are conducted.  FDA is requiring the manufacturer to do additional studies that will look more closely at the potential risk of preterm births and pregnancy-related high blood pressure issues in mothers, including pre-eclampsia.  Severe allergic reactions to vaccines are rare but can happen after any vaccine and can be life-threatening. If you see signs of a severe allergic reaction after vaccination (hives, swelling of the face and throat, difficulty breathing, a fast heartbeat, dizziness, or weakness), seek immediate medical care by calling 911.  As with any medicine or vaccine there is a very remote chance of the vaccine causing other serious injury or death after vaccination.  Adverse events following vaccination should be reported to the Vaccine Adverse Event Reporting System (VAERS), even if it's not clear that the vaccine caused the adverse event. You or your doctor can report an adverse event to [facility] and FDA through VAERS. If you need further assistance reporting to VAERS, please email [email or web address] [email or web address] or call [phone].  If you have any questions about side effects from the maternal RSV vaccine, talk with your healthcare provider.  Do I need a prescription for a maternal RSV vaccine?  Until the vaccine available in the office, you will need a prescription to  take to a local pharmacy that is providing the vaccine.   How do I pay for the maternal RSV vaccine?  Most private health insurance plans cover the maternal RSV vaccine, but there may be a cost to you depending on your plan.  Contact your insurer to find out.  Medicaid Beginning July 14, 2022, most people with coverage from [facility] and [employer] [facility]) will be guaranteed coverage of all vaccines recommended by the Advisory Committee on Immunization Practice at no cost to them.   Source: [facility] for Immunization and Respiratory Diseases

## 2023-10-11 LAB — CBC
Hematocrit: 38.8 % (ref 34.0–46.6)
Hemoglobin: 12.9 g/dL (ref 11.1–15.9)
MCH: 29.8 pg (ref 26.6–33.0)
MCHC: 33.2 g/dL (ref 31.5–35.7)
MCV: 90 fL (ref 79–97)
Platelets: 174 10*3/uL (ref 150–450)
RBC: 4.33 x10E6/uL (ref 3.77–5.28)
RDW: 12.1 % (ref 11.7–15.4)
WBC: 3.8 10*3/uL (ref 3.4–10.8)

## 2023-10-11 LAB — GLUCOSE TOLERANCE, 2 HOURS W/ 1HR
Glucose, 1 hour: 134 mg/dL (ref 70–179)
Glucose, 2 hour: 99 mg/dL (ref 70–152)
Glucose, Fasting: 66 mg/dL — ABNORMAL LOW (ref 70–91)

## 2023-10-11 LAB — RPR: RPR Ser Ql: NONREACTIVE

## 2023-10-11 LAB — HIV ANTIBODY (ROUTINE TESTING W REFLEX): HIV Screen 4th Generation wRfx: NONREACTIVE

## 2023-10-14 ENCOUNTER — Ambulatory Visit: Payer: 59 | Attending: Obstetrics and Gynecology

## 2023-10-14 ENCOUNTER — Ambulatory Visit: Payer: 59 | Admitting: *Deleted

## 2023-10-14 ENCOUNTER — Other Ambulatory Visit: Payer: Self-pay | Admitting: *Deleted

## 2023-10-14 VITALS — BP 130/80

## 2023-10-14 DIAGNOSIS — E669 Obesity, unspecified: Secondary | ICD-10-CM

## 2023-10-14 DIAGNOSIS — O99212 Obesity complicating pregnancy, second trimester: Secondary | ICD-10-CM | POA: Diagnosis present

## 2023-10-14 DIAGNOSIS — O99213 Obesity complicating pregnancy, third trimester: Secondary | ICD-10-CM

## 2023-10-14 DIAGNOSIS — Z3A30 30 weeks gestation of pregnancy: Secondary | ICD-10-CM | POA: Diagnosis not present

## 2023-10-14 DIAGNOSIS — O0993 Supervision of high risk pregnancy, unspecified, third trimester: Secondary | ICD-10-CM | POA: Insufficient documentation

## 2023-10-15 ENCOUNTER — Encounter: Payer: Self-pay | Admitting: Obstetrics & Gynecology

## 2023-10-15 NOTE — L&D Delivery Note (Signed)
 OB/GYN Faculty Practice Delivery Note  Maria Morgan is a 33 y.o. G3P2002 s/p TSVD at [redacted]w[redacted]d. She was admitted for IOL for NRNST and Oligo.   ROM: 4h 56m with clear fluid GBS Status: neg Maximum Maternal Temperature: 97.5 farenheit   Labor Progress: IOL with dual cytotec and FB, SROM, normal progression  Delivery Date/Time: 12/18/23 at 0016 Delivery: Called to room and patient was complete and pushing. Head delivered in LOA position. No nuchal cord present. Shoulder and body delivered in usual fashion. Infant with spontaneous cry, placed on mother's abdomen, dried and stimulated. Cord clamped x 2 after 1-minute delay, and cut by the patient. Cord blood drawn. Placenta delivered spontaneously with gentle cord traction. Fundus firm with massage and IV Pitocin administered. Labia, perineum, vagina, and cervix inspected and found to be intact.   Placenta: Spontaneous intact, L&D Complications: None Lacerations: Intact EBL: 53 Analgesia: Epidural   Postpartum Planning [ X] message to sent to schedule follow-up  Arly.Keller ] vaccines UTD  Infant: Baby Boy   APGARs 8-9  Pending weight  Herminio Commons, Student-MidWife  12/18/2023 12:36 AM

## 2023-10-23 ENCOUNTER — Encounter: Payer: 59 | Admitting: Family Medicine

## 2023-10-28 ENCOUNTER — Telehealth: Payer: Self-pay

## 2023-10-28 NOTE — Telephone Encounter (Signed)
 LM with patient to call back to clarify exactly what is needed. Danna Hefty, RN

## 2023-10-31 ENCOUNTER — Encounter: Payer: Self-pay | Admitting: Obstetrics & Gynecology

## 2023-11-03 ENCOUNTER — Encounter: Payer: Self-pay | Admitting: Obstetrics & Gynecology

## 2023-11-03 ENCOUNTER — Telehealth (INDEPENDENT_AMBULATORY_CARE_PROVIDER_SITE_OTHER): Payer: 59 | Admitting: Obstetrics & Gynecology

## 2023-11-03 DIAGNOSIS — O0993 Supervision of high risk pregnancy, unspecified, third trimester: Secondary | ICD-10-CM

## 2023-11-03 DIAGNOSIS — Z3A33 33 weeks gestation of pregnancy: Secondary | ICD-10-CM

## 2023-11-03 DIAGNOSIS — O99210 Obesity complicating pregnancy, unspecified trimester: Secondary | ICD-10-CM

## 2023-11-03 NOTE — Progress Notes (Signed)
OBSTETRICS PRENATAL VIRTUAL VISIT ENCOUNTER NOTE  Provider location: Center for [facility]   Patient location: Home  I connected with Maria Morgan on 11/03/23 at  9:15 AM EST by MyChart Video Encounter and verified that I am speaking with the correct person using two identifiers. I discussed the limitations, risks, security and privacy concerns of performing an evaluation and management service virtually and the availability of in person appointments. I also discussed with the patient that there may be a patient responsible charge related to this service. The patient expressed understanding and agreed to proceed. Subjective:  Maria Morgan is a 33 y.o. G3P2002 at [redacted]w[redacted]d being seen today for ongoing prenatal care.  She is currently monitored for the following issues for this high-risk pregnancy and has Pituitary adenoma (HCC); Vitamin D deficiency; ADHD (attention deficit hyperactivity disorder); Maternal morbid obesity, antepartum (HCC); Leukopenia; and Supervision of high risk pregnancy, antepartum, third trimester on their problem list.  Patient reports no complaints.  Contractions: Not present. Vag. Bleeding: None.  Movement: Present. Denies any leaking of fluid.   The following portions of the patient's history were reviewed and updated as appropriate: allergies, current medications, past family history, past medical history, past social history, past surgical history and problem list.   Objective:  There were no vitals filed for this visit.  Fetal Status:     Movement: Present     General:  Alert, oriented and cooperative. Patient is in no acute distress.  Respiratory: Normal respiratory effort, no problems with respiration noted  Mental Status: Normal mood and affect. Normal behavior. Normal judgment and thought content.  Rest of physical exam deferred due to type of encounter  Imaging: [place] MFM OB FOLLOW UP Result Date:  10/14/2023 ----------------------------------------------------------------------  OBSTETRICS REPORT                       (Signed Final 10/14/2023 10:33 am) ---------------------------------------------------------------------- Patient Info  ID #:       [id number]                          D.O.B.:  May 06, 1991 (33 yrs)(F)  Name:       Maria Morgan                 Visit Date: 10/14/2023 09:23 am ---------------------------------------------------------------------- Performed By  Attending:        Ma Rings MD         Ref. Address:     [address]                                                             [address]  Performed By:     Reinaldo Raddle            [facility]  Referred By:      [facility] ---------------------------------------------------------------------- Orders  #  Description                           Code        Ordered By  1  [place] MFM OB FOLLOW UP                   [phone]    Noralee Space ----------------------------------------------------------------------  #  Order #                     Accession #                Episode #  1  [id number]                   [id number]                 [id number] ---------------------------------------------------------------------- Indications  Obesity complicating pregnancy, third          O99.213  trimester  Fetal or maternal indication (Leukopenia,      O35.8XX0  Malaria, Pituitary adenoma)  Insufficient DNA, neg.CF 2017  [redacted] weeks gestation of pregnancy                Z3A.30  Antenatal follow-up for nonvisualized fetal    Z36.2  anatomy ---------------------------------------------------------------------- Fetal Evaluation  Num Of Fetuses:         1  Fetal Heart Rate(bpm):  133  Cardiac Activity:       Observed  Presentation:            Cephalic  Placenta:               Anterior  P. Cord Insertion:      Previously seen  Amniotic Fluid  AFI FV:      Subjectively increased  AFI Sum(cm)     %Tile       Largest Pocket(cm)  24.05           95          6.92  RUQ(cm)       RLQ(cm)       LUQ(cm)        LLQ(cm)  6.75          5.53          6.92           4.85 ---------------------------------------------------------------------- Biometry  BPD:      81.3  mm     G. Age:  32w 5d         90  %    CI:        76.86   %    70 - 86  FL/HC:      19.9   %    19.3 - 21.3  HC:      293.7  mm     G. Age:  32w 3d         63  %    HC/AC:      1.07        0.96 - 1.17  AC:      274.9  mm     G. Age:  31w 4d         71  %    FL/BPD:     71.8   %    71 - 87  FL:       58.4  mm     G. Age:  30w 4d         30  %    FL/AC:      21.2   %    20 - 24  LV:        4.6  mm  Est. FW:    1762  gm    3 lb 14 oz      62  % ---------------------------------------------------------------------- OB History  Gravidity:    3         Term:   2  Living:       2 ---------------------------------------------------------------------- Gestational Age  LMP:           31w 1d        Date:  03/10/23                  EDD:   12/15/23  U/S Today:     31w 6d                                        EDD:   12/10/23  Best:          30w 5d     Det. By:  U/S C R L  (04/30/23)    EDD:   12/18/23 ---------------------------------------------------------------------- Targeted Anatomy  Central Nervous System  Calvarium/Cranial V.:  Appears normal         Cereb./Vermis:          Previously seen  Cavum:                 Appears normal         Cisterna Magna:         Previously seen  Lateral Ventricles:    Appears normal         Midline Falx:           Previously seen  Choroid Plexus:        Previously seen  Spine  Cervical:              Previously seen        Sacral:                 Previously seen  Thoracic:              Previously seen        Shape/Curvature:         Previously seen  Lumbar:                Previously seen  Head/Neck  Lips:                  Previously seen  Profile:                Previously seen  Neck:                  Previously seen        Orbits/Eyes:            Previously seen  Nuchal Fold:           Not applicable         Mandible:               Previously seen  Nasal Bone:            Present                Maxilla:                Previously seen  Thorax  4 Chamber View:        Not well visualized    Interventr. Septum:     Appears normal  Cardiac Rhythm:        Normal                 Cardiac Axis:           Appears normal  Cardiac Situs:         Appears normal         Diaphragm:              Appears normal  Rt Outflow Tract:      Previously seen        3 Vessel View:          Previously seen  Lt Outflow Tract:      Previously seen        3 V Trachea View:       Previously seen  Aortic Arch:           Previously seen        IVC:                    Previously seen  Ductal Arch:           Not well visualized    Crossing:               Previously seen  SVC:                   Previously seen  Abdomen  Ventral Wall:          Previously seen        Lt Kidney:              Appears normal  Cord Insertion:        Previously seen        Rt Kidney:              Appears normal  Situs:                 Appears normal         Bladder:                Appears normal  Stomach:               Appears normal  Extremities  Lt Humerus:            Previously seen        Lt Femur:               Previously seen  Rt Humerus:            Previously seen        Rt Femur:               Previously seen  Lt Forearm:            Previously seen        Lt Lower Leg:           Previously seen  Rt Forearm:            Previously seen        Rt Lower Leg:           Previously seen  Lt Hand:               Digits Visualized      Lt Foot:                Previously seen  Rt Hand:               Previously seen        Rt Foot:                Previously seen  Other  Umbilical Cord:         Previously seen        Genitalia:              Female-nml  Comment:     Technically difficult due to maternal habitus and fetal position. ---------------------------------------------------------------------- Cervix Uterus Adnexa  Cervix  Not visualized (advanced GA >24wks)  Uterus  No abnormality visualized.  Right Ovary  Not visualized.  Left Ovary  Size(cm)     2.96   x   1.45   x  1.61      Vol(ml): 3.62  Within normal limits.  Cul De Sac  No free fluid seen.  Adnexa  No adnexal mass visualized ---------------------------------------------------------------------- Comments  This patient was seen for a follow up growth scan due to  maternal obesity with a BMI of 48.  She denies any problems  since her last exam.  She was informed that the fetal growth and amniotic fluid  level appears appropriate for her gestational age.  Due to maternal obesity, we will start weekly fetal testing at  34 weeks.  She will return for a BPP and growth scan in 4 weeks. ----------------------------------------------------------------------                   Ma Rings, MD Electronically Signed Final Report   10/14/2023 10:33 am ----------------------------------------------------------------------    Assessment and Plan:  Pregnancy: [id number] at [redacted]w[redacted]d 1. Maternal morbid obesity, antepartum (HCC) (Primary) Recent EFW 62%, already scheduled for growth scans and BPPs as per MFM.  2. [redacted] weeks gestation of pregnancy 3. Supervision of high risk pregnancy, antepartum, third trimester No other concerns. Preterm labor symptoms and general obstetric precautions including but not limited to vaginal bleeding, contractions, leaking of fluid and fetal movement were reviewed in detail with the patient. I discussed the assessment and treatment plan with the patient. The patient was provided an opportunity to ask questions and all were answered. The patient agreed with the plan and demonstrated an understanding of the instructions. The patient  was advised to call back or seek an in-person office evaluation/go to [facility]U at [facility] for any urgent or concerning symptoms. Please refer to After Visit Summary for other counseling recommendations.   I provided 7 minutes of face-to-face time  during this encounter.  Return in about 2 weeks (around 11/17/2023) for OFFICE OB VISIT (MD only).  Future Appointments  Date Time Provider Department Center  11/11/2023  9:30 AM [facility] US3 [facility]  11/17/2023  9:15 AM Celedonio Savage, MD [facility] None  11/18/2023  9:30 AM [facility] US5 [facility]  11/25/2023  9:30 AM [facility] US5 [facility]  11/28/2023  9:55 AM Lorriane Shire, MD [facility] None  12/02/2023  9:30 AM [facility] US5 [facility]  12/03/2023  9:15 AM Stinson, Rhona Raider, DO [facility] None    Jaynie Collins, MD [facility], [facility]

## 2023-11-03 NOTE — Patient Instructions (Signed)

## 2023-11-11 ENCOUNTER — Other Ambulatory Visit: Payer: Self-pay

## 2023-11-11 ENCOUNTER — Ambulatory Visit: Payer: 59 | Attending: Obstetrics

## 2023-11-11 VITALS — BP 131/80

## 2023-11-11 DIAGNOSIS — D352 Benign neoplasm of pituitary gland: Secondary | ICD-10-CM

## 2023-11-11 DIAGNOSIS — O99891 Other specified diseases and conditions complicating pregnancy: Secondary | ICD-10-CM

## 2023-11-11 DIAGNOSIS — E669 Obesity, unspecified: Secondary | ICD-10-CM

## 2023-11-11 DIAGNOSIS — O99213 Obesity complicating pregnancy, third trimester: Secondary | ICD-10-CM | POA: Insufficient documentation

## 2023-11-11 DIAGNOSIS — D72819 Decreased white blood cell count, unspecified: Secondary | ICD-10-CM | POA: Diagnosis not present

## 2023-11-11 DIAGNOSIS — O0993 Supervision of high risk pregnancy, unspecified, third trimester: Secondary | ICD-10-CM | POA: Diagnosis present

## 2023-11-11 DIAGNOSIS — Z3A34 34 weeks gestation of pregnancy: Secondary | ICD-10-CM

## 2023-11-12 ENCOUNTER — Other Ambulatory Visit: Payer: Self-pay | Admitting: *Deleted

## 2023-11-12 DIAGNOSIS — O99213 Obesity complicating pregnancy, third trimester: Secondary | ICD-10-CM

## 2023-11-17 ENCOUNTER — Encounter: Payer: 59 | Admitting: Family Medicine

## 2023-11-18 ENCOUNTER — Other Ambulatory Visit: Payer: Self-pay

## 2023-11-18 ENCOUNTER — Ambulatory Visit: Payer: 59

## 2023-11-18 ENCOUNTER — Ambulatory Visit: Payer: 59 | Attending: Obstetrics and Gynecology | Admitting: *Deleted

## 2023-11-18 VITALS — BP 120/74

## 2023-11-18 DIAGNOSIS — O99213 Obesity complicating pregnancy, third trimester: Secondary | ICD-10-CM | POA: Diagnosis present

## 2023-11-18 DIAGNOSIS — Z3A35 35 weeks gestation of pregnancy: Secondary | ICD-10-CM | POA: Insufficient documentation

## 2023-11-18 NOTE — Procedures (Signed)
 Maria Morgan 10-25-1990 [redacted]w[redacted]d  Fetus A Non-Stress Test Interpretation for 11/18/23  Indication:  Obesity  Fetal Heart Rate A Mode: External Baseline Rate (A): 130 bpm Variability: Moderate Accelerations: 15 x 15 Decelerations: None Multiple birth?: No  Uterine Activity Mode: Toco Contraction Frequency (min): Occasional Contraction Duration (sec): 40 Contraction Quality: Mild Resting Tone Palpated: Relaxed Resting Time: Adequate  Interpretation (Fetal Testing) Nonstress Test Interpretation: Reactive Comments: Tracing reviewed by Dr. Arna

## 2023-11-23 ENCOUNTER — Encounter (HOSPITAL_COMMUNITY): Payer: Self-pay | Admitting: Obstetrics and Gynecology

## 2023-11-23 ENCOUNTER — Inpatient Hospital Stay (HOSPITAL_COMMUNITY)
Admission: AD | Admit: 2023-11-23 | Discharge: 2023-11-23 | Disposition: A | Discharge status: Home / Self Care | Payer: 59 | Attending: Obstetrics and Gynecology | Admitting: Obstetrics and Gynecology

## 2023-11-23 DIAGNOSIS — O479 False labor, unspecified: Secondary | ICD-10-CM

## 2023-11-23 DIAGNOSIS — Z3689 Encounter for other specified antenatal screening: Secondary | ICD-10-CM | POA: Diagnosis not present

## 2023-11-23 DIAGNOSIS — O4703 False labor before 37 completed weeks of gestation, third trimester: Secondary | ICD-10-CM | POA: Insufficient documentation

## 2023-11-23 DIAGNOSIS — Z3A36 36 weeks gestation of pregnancy: Secondary | ICD-10-CM | POA: Diagnosis not present

## 2023-11-23 MED ORDER — ACETAMINOPHEN 500 MG PO TABS
1000.0000 mg | ORAL_TABLET | Freq: Once | ORAL | Status: AC
  Administered 2023-11-23: 1000 mg via ORAL
  Filled 2023-11-23: qty 2

## 2023-11-23 NOTE — MAU Note (Signed)
 Maria Morgan is a 33 y.o. at [redacted]w[redacted]d here in MAU reporting: EMS, contractions started Friday night.  Has been every , now q6 per EMS..  felt increased fluid around 1310, not really since. No bleeding.  Reports +FM. BP 152/100, repeat 140/98.  Blood sugar 88. 3rd preg, due 3/6. Reports no problems.  Onset of complaint: ongoing ctx's, worse this morning.  Pain score: 6 Vitals:   11/23/23 1353 11/23/23 1355  BP: 139/84   Pulse: 92   Resp: 18   Temp: 97.9 F (36.6 C)   SpO2:  99%     FHT:140 Lab orders placed from triage:

## 2023-11-23 NOTE — MAU Provider Note (Signed)
 S: Ms. Britanie Harshman is a 33 y.o. G3P2002 at [redacted]w[redacted]d  who presents to MAU today for labor evaluation.     Cervical exam by RN:  Dilation: Fingertip Effacement (%): Thick Station: Ballotable Exam by:: CANDIE Alas, RN  Fetal Monitoring: Baseline: 130 Variability: moderate Accelerations: present Decelerations: absent Contractions: UI  MDM NST reviewed. Patient felt much improved after tylenol . Discussed she is likely having lumbar pain from advanced gestation vs preterm labor.  A: SIUP at [redacted]w[redacted]d  False labor  P: Discharge home Labor precautions and kick counts included in AVS Patient to follow-up with primary OB as scheduled  Patient may return to MAU as needed or when in labor   Nicholaus Almarie HERO, MD 11/23/2023 2:53 PM

## 2023-11-25 ENCOUNTER — Other Ambulatory Visit: Payer: 59

## 2023-11-28 ENCOUNTER — Other Ambulatory Visit (HOSPITAL_COMMUNITY)
Admission: RE | Admit: 2023-11-28 | Discharge: 2023-11-28 | Disposition: A | Discharge status: Home / Self Care | Payer: 59 | Source: Ambulatory Visit | Attending: Obstetrics and Gynecology | Admitting: Obstetrics and Gynecology

## 2023-11-28 ENCOUNTER — Ambulatory Visit (INDEPENDENT_AMBULATORY_CARE_PROVIDER_SITE_OTHER): Payer: 59 | Admitting: Obstetrics and Gynecology

## 2023-11-28 VITALS — BP 127/78 | HR 84 | Wt 310.0 lb

## 2023-11-28 DIAGNOSIS — Z3A36 36 weeks gestation of pregnancy: Secondary | ICD-10-CM

## 2023-11-28 DIAGNOSIS — O0993 Supervision of high risk pregnancy, unspecified, third trimester: Secondary | ICD-10-CM | POA: Diagnosis not present

## 2023-11-28 DIAGNOSIS — O99210 Obesity complicating pregnancy, unspecified trimester: Secondary | ICD-10-CM | POA: Diagnosis not present

## 2023-11-28 DIAGNOSIS — O99213 Obesity complicating pregnancy, third trimester: Secondary | ICD-10-CM | POA: Insufficient documentation

## 2023-11-28 NOTE — Progress Notes (Signed)
   PRENATAL VISIT NOTE  Subjective:  Maria Morgan is a 33 y.o. G3P2002 at [redacted]w[redacted]d being seen today for ongoing prenatal care.  She is currently monitored for the following issues for this high-risk pregnancy and has Pituitary adenoma (HCC); Vitamin D deficiency; ADHD (attention deficit hyperactivity disorder); Maternal morbid obesity, antepartum (HCC); Leukopenia; and Supervision of high risk pregnancy, antepartum, third trimester on their problem list.  Patient reports backache.  Contractions: Irregular. Vag. Bleeding: None.  Movement: Present. Denies leaking of fluid.   The following portions of the patient's history were reviewed and updated as appropriate: allergies, current medications, past family history, past medical history, past social history, past surgical history and problem list.   Objective:   Vitals:   11/28/23 0923  BP: 127/78  Pulse: 84  Weight: (!) 310 lb (140.6 kg)    Fetal Status: Fetal Heart Rate (bpm): 142   Movement: Present     General:  Alert, oriented and cooperative. Patient is in no acute distress.  Skin: Skin is warm and dry. No rash noted.   Cardiovascular: Normal heart rate noted  Respiratory: Normal respiratory effort, no problems with respiration noted  Abdomen: Soft, gravid, appropriate for gestational age.  Pain/Pressure: Present     Pelvic: Chaperone present         Extremities: Normal range of motion.  Edema: Mild pitting, slight indentation  Mental Status: Normal mood and affect. Normal behavior. Normal judgment and thought content.   Assessment and Plan:  Pregnancy: G3P2002 at [redacted]w[redacted]d 1. [redacted] weeks gestation of pregnancy (Primary) GBS and GC/CT swabs collected - Cervicovaginal ancillary only - Strep Gp B NAA  2. Supervision of high risk pregnancy, antepartum, third trimester FHR wnl  Considering IUD for PP contraception, would prefer interval placement   3. Maternal morbid obesity, antepartum (HCC) EFW 85%ile  Term labor symptoms and  general obstetric precautions including but not limited to vaginal bleeding, contractions, leaking of fluid and fetal movement were reviewed in detail with the patient. Please refer to After Visit Summary for other counseling recommendations.   No follow-ups on file.  Future Appointments  Date Time Provider Department Center  12/02/2023  9:30 AM [facility] US5 [facility]  12/03/2023  9:15 AM Levie Heritage, DO [facility] None  12/09/2023  9:30 AM [facility] US5 [facility]    Lorriane Shire, MD

## 2023-11-30 LAB — STREP GP B NAA: Strep Gp B NAA: NEGATIVE

## 2023-12-01 ENCOUNTER — Encounter: Payer: Self-pay | Admitting: Obstetrics and Gynecology

## 2023-12-01 LAB — CERVICOVAGINAL ANCILLARY ONLY
Chlamydia: NEGATIVE
Comment: NEGATIVE
Comment: NORMAL
Neisseria Gonorrhea: NEGATIVE

## 2023-12-02 ENCOUNTER — Ambulatory Visit: Payer: 59 | Attending: Obstetrics

## 2023-12-02 DIAGNOSIS — E669 Obesity, unspecified: Secondary | ICD-10-CM | POA: Diagnosis not present

## 2023-12-02 DIAGNOSIS — O99213 Obesity complicating pregnancy, third trimester: Secondary | ICD-10-CM | POA: Diagnosis present

## 2023-12-02 DIAGNOSIS — Z3A37 37 weeks gestation of pregnancy: Secondary | ICD-10-CM | POA: Diagnosis not present

## 2023-12-03 ENCOUNTER — Encounter: Payer: 59 | Admitting: Family Medicine

## 2023-12-04 MED ORDER — VALACYCLOVIR HCL 500 MG PO TABS: ORAL_TABLET | ORAL | 5 refills | Status: DC

## 2023-12-04 MED ORDER — VALACYCLOVIR HCL 500 MG PO TABS
500.0000 mg | ORAL_TABLET | Freq: Every day | ORAL | 12 refills | Status: DC
  Filled 2023-12-09: qty 30, 30d supply, fill #0

## 2023-12-09 ENCOUNTER — Ambulatory Visit: Payer: 59 | Attending: Maternal & Fetal Medicine

## 2023-12-09 ENCOUNTER — Other Ambulatory Visit: Payer: Self-pay | Admitting: *Deleted

## 2023-12-09 ENCOUNTER — Other Ambulatory Visit (HOSPITAL_BASED_OUTPATIENT_CLINIC_OR_DEPARTMENT_OTHER): Payer: Self-pay

## 2023-12-09 VITALS — BP 136/88

## 2023-12-09 DIAGNOSIS — O99213 Obesity complicating pregnancy, third trimester: Secondary | ICD-10-CM | POA: Diagnosis present

## 2023-12-09 DIAGNOSIS — O0993 Supervision of high risk pregnancy, unspecified, third trimester: Secondary | ICD-10-CM | POA: Diagnosis present

## 2023-12-09 DIAGNOSIS — E669 Obesity, unspecified: Secondary | ICD-10-CM | POA: Diagnosis not present

## 2023-12-09 DIAGNOSIS — Z3A38 38 weeks gestation of pregnancy: Secondary | ICD-10-CM

## 2023-12-09 DIAGNOSIS — O99210 Obesity complicating pregnancy, unspecified trimester: Secondary | ICD-10-CM

## 2023-12-11 ENCOUNTER — Encounter: Payer: 59 | Admitting: Family Medicine

## 2023-12-11 DIAGNOSIS — Z3A38 38 weeks gestation of pregnancy: Secondary | ICD-10-CM

## 2023-12-16 ENCOUNTER — Ambulatory Visit: Attending: Obstetrics | Admitting: Obstetrics

## 2023-12-16 ENCOUNTER — Ambulatory Visit (HOSPITAL_BASED_OUTPATIENT_CLINIC_OR_DEPARTMENT_OTHER): Payer: 59

## 2023-12-16 DIAGNOSIS — Z3A39 39 weeks gestation of pregnancy: Secondary | ICD-10-CM

## 2023-12-16 DIAGNOSIS — O99213 Obesity complicating pregnancy, third trimester: Secondary | ICD-10-CM | POA: Diagnosis not present

## 2023-12-16 DIAGNOSIS — O99210 Obesity complicating pregnancy, unspecified trimester: Secondary | ICD-10-CM | POA: Insufficient documentation

## 2023-12-16 NOTE — Progress Notes (Signed)
 MFM Consult Note  Maria Morgan is currently at 39 weeks and 5 days.    She was seen today for fetal testing due to maternal obesity with a BMI of 48.   She had a growth ultrasound performed last week which showed an EFW of 7 pounds 11 ounces (59th percentile).    She denies any problems since her last exam.    She had a reactive NST today.    A bedside ultrasound performed today showed a total AFI of 4.1 cm.  A 2 cm maximal vertical pocket of fluid was present.    Due to maternal obesity and oligohydramnios, the patient will be scheduled for induction tomorrow morning.  The patient is happy and excited about the plan for delivery tomorrow.    She stated that all of her questions were answered.    A total of 20 minutes was spent counseling and coordinating the care for this patient.

## 2023-12-16 NOTE — Procedures (Signed)
 Maria Morgan [birth year] [redacted]w[redacted]d  Fetus A Non-Stress Test Interpretation for 12/16/23  Indication:  Obesity in pregnancy - NST with AFI  Fetal Heart Rate A Mode: External Baseline Rate (A): 140 bpm Variability: Moderate Accelerations: 15 x 15 Decelerations: None Multiple birth?: No  Uterine Activity Mode: Toco, Palpation Contraction Frequency (min): Irregular Contraction Quality: Mild Resting Tone Palpated: Relaxed Resting Time: Adequate  Interpretation (Fetal Testing) Nonstress Test Interpretation: Reactive Overall Impression: Reassuring for gestational age Comments: Reviewed by Dr. Parke Poisson

## 2023-12-17 ENCOUNTER — Other Ambulatory Visit: Payer: Self-pay

## 2023-12-17 ENCOUNTER — Inpatient Hospital Stay (HOSPITAL_COMMUNITY): Admitting: Anesthesiology

## 2023-12-17 ENCOUNTER — Inpatient Hospital Stay (HOSPITAL_COMMUNITY)
Admission: AD | Admit: 2023-12-17 | Discharge: 2023-12-20 | DRG: 806 | Disposition: A | Discharge status: Home / Self Care | Attending: Obstetrics and Gynecology | Admitting: Obstetrics and Gynecology

## 2023-12-17 ENCOUNTER — Inpatient Hospital Stay (HOSPITAL_COMMUNITY): Attending: Family Medicine

## 2023-12-17 ENCOUNTER — Encounter (HOSPITAL_COMMUNITY): Payer: Self-pay | Admitting: Obstetrics & Gynecology

## 2023-12-17 ENCOUNTER — Inpatient Hospital Stay (HOSPITAL_COMMUNITY): Admission: RE | Admit: 2023-12-17 | Source: Home / Self Care | Admitting: Family Medicine

## 2023-12-17 DIAGNOSIS — A6000 Herpesviral infection of urogenital system, unspecified: Secondary | ICD-10-CM | POA: Diagnosis present

## 2023-12-17 DIAGNOSIS — O99214 Obesity complicating childbirth: Secondary | ICD-10-CM | POA: Diagnosis present

## 2023-12-17 DIAGNOSIS — D352 Benign neoplasm of pituitary gland: Secondary | ICD-10-CM | POA: Diagnosis present

## 2023-12-17 DIAGNOSIS — O288 Other abnormal findings on antenatal screening of mother: Principal | ICD-10-CM

## 2023-12-17 DIAGNOSIS — O9832 Other infections with a predominantly sexual mode of transmission complicating childbirth: Secondary | ICD-10-CM | POA: Diagnosis present

## 2023-12-17 DIAGNOSIS — E66813 Obesity, class 3: Secondary | ICD-10-CM | POA: Diagnosis present

## 2023-12-17 DIAGNOSIS — O26893 Other specified pregnancy related conditions, third trimester: Secondary | ICD-10-CM | POA: Diagnosis present

## 2023-12-17 DIAGNOSIS — O4100X0 Oligohydramnios, unspecified trimester, not applicable or unspecified: Secondary | ICD-10-CM | POA: Diagnosis present

## 2023-12-17 DIAGNOSIS — Z3A39 39 weeks gestation of pregnancy: Secondary | ICD-10-CM | POA: Diagnosis not present

## 2023-12-17 DIAGNOSIS — O368330 Maternal care for abnormalities of the fetal heart rate or rhythm, third trimester, not applicable or unspecified: Secondary | ICD-10-CM | POA: Diagnosis not present

## 2023-12-17 DIAGNOSIS — Z833 Family history of diabetes mellitus: Secondary | ICD-10-CM | POA: Diagnosis not present

## 2023-12-17 DIAGNOSIS — O4103X0 Oligohydramnios, third trimester, not applicable or unspecified: Secondary | ICD-10-CM | POA: Diagnosis present

## 2023-12-17 DIAGNOSIS — O0993 Supervision of high risk pregnancy, unspecified, third trimester: Secondary | ICD-10-CM

## 2023-12-17 DIAGNOSIS — Z3A40 40 weeks gestation of pregnancy: Secondary | ICD-10-CM | POA: Diagnosis not present

## 2023-12-17 DIAGNOSIS — O99210 Obesity complicating pregnancy, unspecified trimester: Secondary | ICD-10-CM | POA: Diagnosis present

## 2023-12-17 DIAGNOSIS — O480 Post-term pregnancy: Secondary | ICD-10-CM | POA: Diagnosis not present

## 2023-12-17 LAB — CBC
HCT: 36.7 % (ref 36.0–46.0)
Hemoglobin: 12.7 g/dL (ref 12.0–15.0)
MCH: 30.1 pg (ref 26.0–34.0)
MCHC: 34.6 g/dL (ref 30.0–36.0)
MCV: 87 fL (ref 80.0–100.0)
Platelets: 167 10*3/uL (ref 150–400)
RBC: 4.22 MIL/uL (ref 3.87–5.11)
RDW: 12.5 % (ref 11.5–15.5)
WBC: 3.6 10*3/uL — ABNORMAL LOW (ref 4.0–10.5)
nRBC: 0 % (ref 0.0–0.2)

## 2023-12-17 LAB — TYPE AND SCREEN
ABO/RH(D): A POS
Antibody Screen: NEGATIVE

## 2023-12-17 LAB — RPR: RPR Ser Ql: NONREACTIVE

## 2023-12-17 LAB — WET PREP, GENITAL
Clue Cells Wet Prep HPF POC: NONE SEEN
Sperm: NONE SEEN
Trich, Wet Prep: NONE SEEN
WBC, Wet Prep HPF POC: <10 (ref <10)
Yeast Wet Prep HPF POC: NONE SEEN

## 2023-12-17 LAB — RUPTURE OF MEMBRANE (ROM)PLUS: Rom Plus: NEGATIVE

## 2023-12-17 LAB — POCT FERN TEST: POCT Fern Test: NEGATIVE

## 2023-12-17 LAB — HIV ANTIBODY (ROUTINE TESTING W REFLEX): HIV Screen 4th Generation wRfx: NONREACTIVE

## 2023-12-17 MED ORDER — ACETAMINOPHEN 325 MG PO TABS: 650.0000 mg | ORAL_TABLET | ORAL | Status: DC | PRN

## 2023-12-17 MED ORDER — FENTANYL-BUPIVACAINE-NACL 0.5-0.125-0.9 MG/250ML-% EP SOLN
12.0000 mL/h | EPIDURAL | Status: DC | PRN
  Administered 2023-12-17: 12 mL/h via EPIDURAL
  Filled 2023-12-17: qty 250

## 2023-12-17 MED ORDER — VALACYCLOVIR HCL 500 MG PO TABS: 500.0000 mg | ORAL_TABLET | Freq: Every day | ORAL | Status: DC

## 2023-12-17 MED ORDER — ONDANSETRON HCL 4 MG/2ML IJ SOLN
4.0000 mg | Freq: Four times a day (QID) | INTRAMUSCULAR | Status: DC | PRN
  Administered 2023-12-17: 4 mg via INTRAVENOUS
  Filled 2023-12-17: qty 2

## 2023-12-17 MED ORDER — LACTATED RINGERS IV SOLN
500.0000 mL | INTRAVENOUS | Status: DC | PRN
  Administered 2023-12-17: 1000 mL via INTRAVENOUS

## 2023-12-17 MED ORDER — SOD CITRATE-CITRIC ACID 500-334 MG/5ML PO SOLN: 30.0000 mL | ORAL | Status: DC | PRN

## 2023-12-17 MED ORDER — FENTANYL CITRATE (PF) 100 MCG/2ML IJ SOLN
50.0000 ug | INTRAMUSCULAR | Status: DC | PRN
  Administered 2023-12-17: 100 ug via INTRAVENOUS
  Administered 2023-12-17: 50 ug via INTRAVENOUS
  Filled 2023-12-17 (×2): qty 2

## 2023-12-17 MED ORDER — PHENYLEPHRINE 80 MCG/ML (10ML) SYRINGE FOR IV PUSH (FOR BLOOD PRESSURE SUPPORT): 80.0000 ug | PREFILLED_SYRINGE | INTRAVENOUS | Status: DC | PRN

## 2023-12-17 MED ORDER — LIDOCAINE HCL (PF) 1 % IJ SOLN
INTRAMUSCULAR | Status: DC | PRN
  Administered 2023-12-17 (×2): 4 mL via EPIDURAL

## 2023-12-17 MED ORDER — OXYTOCIN BOLUS FROM INFUSION
333.0000 mL | Freq: Once | INTRAVENOUS | Status: AC
  Administered 2023-12-18: 333 mL via INTRAVENOUS

## 2023-12-17 MED ORDER — TERBUTALINE SULFATE 1 MG/ML IJ SOLN: 0.2500 mg | Freq: Once | INTRAMUSCULAR | Status: DC | PRN

## 2023-12-17 MED ORDER — MISOPROSTOL 25 MCG QUARTER TABLET
25.0000 ug | ORAL_TABLET | Freq: Once | ORAL | Status: AC
  Administered 2023-12-17: 25 ug via VAGINAL
  Filled 2023-12-17: qty 1

## 2023-12-17 MED ORDER — LACTATED RINGERS IV SOLN
500.0000 mL | Freq: Once | INTRAVENOUS | Status: AC
  Administered 2023-12-17: 500 mL via INTRAVENOUS

## 2023-12-17 MED ORDER — LACTATED RINGERS IV SOLN: INTRAVENOUS | Status: DC

## 2023-12-17 MED ORDER — OXYCODONE-ACETAMINOPHEN 5-325 MG PO TABS: 1.0000 | ORAL_TABLET | ORAL | Status: DC | PRN

## 2023-12-17 MED ORDER — EPHEDRINE 5 MG/ML INJ: 10.0000 mg | INTRAVENOUS | Status: DC | PRN

## 2023-12-17 MED ORDER — PHENYLEPHRINE 80 MCG/ML (10ML) SYRINGE FOR IV PUSH (FOR BLOOD PRESSURE SUPPORT)
80.0000 ug | PREFILLED_SYRINGE | INTRAVENOUS | Status: DC | PRN
  Administered 2023-12-17: 80 ug via INTRAVENOUS
  Filled 2023-12-17: qty 10

## 2023-12-17 MED ORDER — OXYCODONE-ACETAMINOPHEN 5-325 MG PO TABS: 2.0000 | ORAL_TABLET | ORAL | Status: DC | PRN

## 2023-12-17 MED ORDER — OXYTOCIN-SODIUM CHLORIDE 30-0.9 UT/500ML-% IV SOLN
2.5000 [IU]/h | INTRAVENOUS | Status: DC
  Administered 2023-12-18: 2.5 [IU]/h via INTRAVENOUS
  Filled 2023-12-17: qty 500

## 2023-12-17 MED ORDER — LIDOCAINE HCL (PF) 1 % IJ SOLN: 30.0000 mL | INTRAMUSCULAR | Status: DC | PRN

## 2023-12-17 MED ORDER — MISOPROSTOL 50MCG HALF TABLET
50.0000 ug | ORAL_TABLET | Freq: Once | ORAL | Status: AC
  Administered 2023-12-17: 50 ug via ORAL
  Filled 2023-12-17: qty 1

## 2023-12-17 MED ORDER — DIPHENHYDRAMINE HCL 50 MG/ML IJ SOLN: 12.5000 mg | INTRAMUSCULAR | Status: DC | PRN

## 2023-12-17 MED ORDER — VALACYCLOVIR HCL 500 MG PO TABS
500.0000 mg | ORAL_TABLET | Freq: Two times a day (BID) | ORAL | Status: DC
Start: 2023-12-17 — End: 2023-12-18
  Administered 2023-12-17: 500 mg via ORAL
  Filled 2023-12-17: qty 1

## 2023-12-17 NOTE — MAU Note (Addendum)
 Maria Morgan is a 33 y.o. at [redacted]w[redacted]d here in MAU reporting: possible ROM at 0700 clear/cloudy fluid that has continually leaked out. Reporting CTXs every 10 mins. Reports +FM denies any VB. Denies any recent sexual intercourse. Denies any vaginal itching, odor or unusual discharge other than the fluid this am.  IOL tomorrow for Oligo   LMP: n/a Onset of complaint: 0700 Pain score: 6 Vitals:   12/17/23 1007  BP: (!) 137/95  Pulse: 92  Resp: 18  Temp: (!) 97.5 F (36.4 C)     FHT:130 Lab orders placed from triage:  Crist Fat, labor eval

## 2023-12-17 NOTE — Progress Notes (Signed)
 Labor Progress Note Maria Morgan is a 33 y.o. G3P2002 at [redacted]w[redacted]d presented for IOL for NRFHT and oligohydramnios.  S: Introduced myself and gathered history at bedside. Patient counseled on methods of induction and augmentation. Patient would like to start with dual cytotec and reevaluate.  O:  BP (!) 138/93   Pulse 73   Temp (!) 97.5 F (36.4 C) (Oral)   Resp 18   LMP 03/10/2023 (Approximate)   SpO2 99%  EFM: 135/moderate variability/+accels/-decels  CVE: Dilation: 1 Effacement (%): 70 Cervical Position: Posterior Station: -3 Presentation: Vertex Exam by:: Ned Grace, RN   A&P: 33 y.o. [id number] [redacted]w[redacted]d here for IOL for NRFHT and oligohydramnios. #Labor: Dual cytotec administered after cephalic presentation confirmed via bedside ultrasound by myself. #Pain: IV fentanyl, nitrous oxide, epidural all available at patient request #FWB: Category I at this time.  #GBS negative #Contraception: Discussed contraception options with patient. She is interested in nexplanon but would like some time to look into it.   Phillis Thackeray L. De Burrs, MD Family Medicine, PGY-1 1:33 PM

## 2023-12-17 NOTE — Progress Notes (Signed)
 Patient ID: Maria Morgan, female   DOB: 1991-05-09, 33 y.o.   MRN: [id number] Maria Morgan is a 33 y.o. G3P2002 at [redacted]w[redacted]d.  Subjective: Pt resting comfortably in bed laying on left side with epidural. Agreeable with SVE.  Objective: BP 105/70   Pulse 88   Temp (!) 97.5 F (36.4 C) (Oral)   Resp 18   LMP 03/10/2023 (Approximate)   SpO2 100%    FHT:  FHR: 140-145 bpm, variability: moderate,  accelerations:  none,  decelerations:  variable, late, prolonged. Epidural recently placed and lower BP's noted UC:   Irregular intermittent tracing due to maternal position Dilation: 7.5 Effacement (%): 80 Cervical Position: Posterior Station: -1 Presentation: Vertex Exam by:: Olivia P RN  Labs: Results for orders placed or performed during the hospital encounter of 12/17/23 (from the past 24 hours)  POCT fern test     Status: Normal   Collection Time: 12/17/23 10:00 AM  Result Value Ref Range   POCT Fern Test Negative = intact amniotic membranes   Rupture of Membrane (ROM) Plus     Status: None   Collection Time: 12/17/23 10:36 AM  Result Value Ref Range   Rom Plus NEGATIVE   Wet prep, genital     Status: None   Collection Time: 12/17/23 10:36 AM  Result Value Ref Range   Yeast Wet Prep HPF POC NONE SEEN NONE SEEN   Trich, Wet Prep NONE SEEN NONE SEEN   Clue Cells Wet Prep HPF POC NONE SEEN NONE SEEN   WBC, Wet Prep HPF POC <10 <10   Sperm NONE SEEN   CBC     Status: Abnormal   Collection Time: 12/17/23 10:58 AM  Result Value Ref Range   WBC 3.6 (L) 4.0 - 10.5 K/uL   RBC 4.22 3.87 - 5.11 MIL/uL   Hemoglobin 12.7 12.0 - 15.0 g/dL   HCT 78.2 95.6 - 21.3 %   MCV 87.0 80.0 - 100.0 fL   MCH 30.1 26.0 - 34.0 pg   MCHC 34.6 30.0 - 36.0 g/dL   RDW 08.6 57.8 - 46.9 %   Platelets 167 150 - 400 K/uL   nRBC 0.0 0.0 - 0.2 %  Type and screen [facility]     Status: None   Collection Time: 12/17/23 10:58 AM  Result Value Ref Range   ABO/RH(D) A POS    Antibody  Screen NEG    Sample Expiration      12/20/2023,2359 Performed at [facility], [address], [place], [place] [address]   RPR     Status: None   Collection Time: 12/17/23 10:58 AM  Result Value Ref Range   RPR Ser Ql NON REACTIVE NON REACTIVE  HIV Antibody (routine testing w rflx)     Status: None   Collection Time: 12/17/23 10:58 AM  Result Value Ref Range   HIV Screen 4th Generation wRfx Non Reactive Non Reactive    Assessment / Plan: [redacted]w[redacted]d week IUP ROM x Hours: 4 Minutes: 6 Oligo Labor: Progressing well, continue with expectant management. SP epidural and lower BP's with decels of FHR. Phenylephrine, IV fluids, and position changes by RN.  Fetal Wellbeing:  Category 2, CNM aware interventions stated above Pain Control:  Epidural Anticipated MOD:  SVD  Herminio Commons, Student-MidWife 12/17/2023 11:36 PM

## 2023-12-17 NOTE — Progress Notes (Signed)
 Patient ID: Maria Morgan, female   DOB: [birth year], 33 y.o.   MRN: [id number]  Comfortable w epidural placement; was feeling some nausea but it has improved; s/p dual cytotec, cervical foley, and then SROM at 1930  BP 113/61, other VSS FHR 120-130s, +accels, occ mi early variables Ctx irreg 3-7 mins Cx deferred  IUP@39 .6wks Oligo IOL process  Plan to check cx ~2200; can augment with Pitocin prn Phenyl given due to variables and drop in BP Anticipate vag delivery  Arabella Merles CNM 12/17/2023 9:33 PM

## 2023-12-17 NOTE — Anesthesia Procedure Notes (Addendum)
 Epidural Patient location during procedure: OB Start time: 12/17/2023 7:53 PM End time: 12/17/2023 7:56 PM  Staffing Anesthesiologist: Kaylyn Layer, MD Performed: anesthesiologist   Preanesthetic Checklist Completed: patient identified, IV checked, risks and benefits discussed, monitors and equipment checked, pre-op evaluation and timeout performed  Epidural Patient position: sitting Prep: DuraPrep and site prepped and draped Patient monitoring: continuous pulse ox, blood pressure and heart rate Approach: midline Location: L3-L4 Injection technique: LOR air  Needle:  Needle type: Tuohy  Needle gauge: 17 G Needle length: 9 cm Needle insertion depth: 8 cm Catheter type: closed end flexible Catheter size: 19 Gauge Catheter at skin depth: 13 cm Test dose: negative and Other (1% lidocaine)  Assessment Events: blood not aspirated, no cerebrospinal fluid, injection not painful, no injection resistance, no paresthesia and negative IV test  Additional Notes Patient identified. Risks, benefits, and alternatives discussed with patient including but not limited to bleeding, infection, nerve damage, paralysis, failed block, incomplete pain control, headache, blood pressure changes, nausea, vomiting, reactions to medication, itching, and postpartum back pain. Confirmed with bedside nurse the patient's most recent platelet count. Confirmed with patient that they are not currently taking any anticoagulation, have any bleeding history, or any family history of bleeding disorders. Patient expressed understanding and wished to proceed. All questions were answered. Sterile technique was used throughout the entire procedure. Please see nursing notes for vital signs.   Crisp LOR after one needle redirection. Test dose was given through epidural catheter and negative prior to continuing to dose epidural or start infusion. Warning signs of high block given to the patient including shortness of breath,  tingling/numbness in hands, complete motor block, or any concerning symptoms with instructions to call for help. Patient was given instructions on fall risk and not to get out of bed. All questions and concerns addressed with instructions to call with any issues or inadequate analgesia.  Reason for block:procedure for pain

## 2023-12-17 NOTE — MAU Provider Note (Addendum)
 Chief Complaint:  Contractions and Rupture of Membranes   HPI   None     Maria Morgan is a 33 y.o. G3P2002 at [redacted]w[redacted]d who presents to maternity admissions reporting ROM at approximately 0700 with clear fluid. Reports contractions approximately 10 mins apart. She denies recent intercourse, VB. She endorses regular and vigorous fetal movement. She reports that she did eat this morning, but is hungry now. Reports she is to be induced today. Denies any symptoms consistent with current HSV outbreak.   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   Benign neoplasm of colon 02/20/2012   Brain tumor (benign) (HCC)    Chlamydia    Gonorrhea    Hyperprolactinemia (HCC) 09/26/2015   Malaria    Obesity, Class III, BMI 40-49.9 (morbid obesity) (HCC) 02/11/2022   OB History  Gravida Para Term Preterm AB Living  3 2 2   2   SAB IAB Ectopic Multiple Live Births      2    # Outcome Date GA Lbr Len/2nd Weight Sex Type Anes PTL Lv  3 Current           2 Term 05/02/17 [redacted]w[redacted]d  3402 g M Vag-Spont None N LIV  1 Term 01/09/13 [redacted]w[redacted]d 09:40 / 00:21 3155 g M Vag-Spont EPI  LIV   Past Surgical History:  Procedure Laterality Date   NO PAST SURGERIES     Family History  Problem Relation Age of Onset   Cancer Mother    Diabetes Paternal Grandfather    Diabetes Other    Social History   Tobacco Use   Smoking status: Never   Smokeless tobacco: Never  Vaping Use   Vaping status: Never Used  Substance Use Topics   Alcohol use: Not Currently   Drug use: No   Allergies  Allergen Reactions   Iodine Shortness Of Breath and Itching    Contrast in IV   Medications Prior to Admission  Medication Sig Dispense Refill Last Dose/Taking   Prenatal Vit-Fe Fumarate-FA (PRENATAL VITAMIN) 27-0.8 MG TABS Take 1 capsule by mouth daily. 30 tablet 0 12/17/2023 Morning   valACYclovir (VALTREX) 500  MG tablet Take 1 tablet (500 mg total) by mouth daily. Can increase to twice a day for 5 days in the event of a recurrence. 30 tablet 12 12/17/2023 Morning   folic acid (FOLVITE) 1 MG tablet Take 1 mg by mouth daily.       I have reviewed patient's Past Medical Hx, Surgical Hx, Family Hx, Social Hx, medications and allergies.   ROS  Pertinent items noted in HPI and remainder of comprehensive ROS otherwise negative.   PHYSICAL EXAM  Patient Vitals for the past 24 hrs:  BP Temp Temp src Pulse Resp SpO2  12/17/23 1115 (!) 138/93 -- -- 73 -- --  12/17/23 1046 120/75 -- -- 82 -- --  12/17/23 1040 -- -- -- -- -- 99 %  12/17/23 1038 (!) 135/96 -- -- 98 -- --  12/17/23 1018 131/83 -- -- 84 -- --  12/17/23 1007 (!) 137/95 (!) 97.5 F (36.4 C) Oral 92 18 --    Constitutional: Well-developed, well-nourished female in no acute distress.  Cardiovascular: normal rate & rhythm, warm and well-perfused Respiratory: normal effort, no problems with respiration noted GI: Abd soft, non-tender, non-distended MS: Extremities nontender, no edema, normal ROM Neurologic: Alert and oriented x 4.  GU: no CVA tenderness Pelvic: NEFG,  physiologic discharge, no blood, cervix clean. Wet prep, fern and ROM plus collected with speculum examination. Patient tolerated exam well. Speculum used to thoroughly examine for any lesions consistent with HSV outbreak, no lesions noted.   Dilation: 1 Effacement (%): 70 Cervical Position: Posterior Station: -3 Presentation: Vertex Exam by:: S [place] CNM  Fetal Tracing: Baseline:  145  Variability: min-moderate Accelerations: 15x15 Decelerations: late Toco: q10 mins   Labs: Results for orders placed or performed during the hospital encounter of 12/17/23 (from the past 24 hours)  POCT fern test     Status: Normal   Collection Time: 12/17/23 10:00 AM  Result Value Ref Range   POCT Fern Test Negative = intact amniotic membranes   Rupture of Membrane (ROM) Plus      Status: None   Collection Time: 12/17/23 10:36 AM  Result Value Ref Range   Rom Plus NEGATIVE   Wet prep, genital     Status: None   Collection Time: 12/17/23 10:36 AM  Result Value Ref Range   Yeast Wet Prep HPF POC NONE SEEN NONE SEEN   Trich, Wet Prep NONE SEEN NONE SEEN   Clue Cells Wet Prep HPF POC NONE SEEN NONE SEEN   WBC, Wet Prep HPF POC <10 <10   Sperm NONE SEEN   CBC     Status: Abnormal   Collection Time: 12/17/23 10:58 AM  Result Value Ref Range   WBC 3.6 (L) 4.0 - 10.5 K/uL   RBC 4.22 3.87 - 5.11 MIL/uL   Hemoglobin 12.7 12.0 - 15.0 g/dL   HCT 16.1 09.6 - 04.5 %   MCV 87.0 80.0 - 100.0 fL   MCH 30.1 26.0 - 34.0 pg   MCHC 34.6 30.0 - 36.0 g/dL   RDW 40.9 81.1 - 91.4 %   Platelets 167 150 - 400 K/uL   nRBC 0.0 0.0 - 0.2 %  Type and screen [facility]     Status: None   Collection Time: 12/17/23 10:58 AM  Result Value Ref Range   ABO/RH(D) A POS    Antibody Screen NEG    Sample Expiration      12/20/2023,2359 Performed at [facility], [address] [place], [place] [address]     Imaging:  No results found.  MDM & MAU COURSE  MDM: Patient with possible ROM. Rule out rupture with fern, speculum, and ROM plus. Probable negative.  Contractions present.  FHT concerning for initial minimal variability and late decelerations.  MAU Course: Orders Placed This Encounter  Procedures   Wet prep, genital   Rupture of Membrane (ROM) Plus   CBC   RPR   HIV Antibody (routine testing w rflx)   Diet laboring Room service appropriate? Yes   Vitals signs per unit policy   Notify physician (specify)   Fetal monitoring per unit policy   Activity as tolerated   Cervical Exam   Measure blood pressure post delivery every 15 min x 1 hour then every 30 min x 1 hour   Fundal check post delivery every 15 min x 1 hour then every 30 min x 1 hour   Apply Labor & Delivery Care Plan   If Rapid HIV test positive or known HIV positive: initiate AZT  orders   May in and out cath x 2 for inability to void   Insert urethral catheter X 1 PRN If Coude Catheter is chosen, qualified resources by campus can be found in the clinical skills  nursing procedure for Coude Catheter 1. If straight catheterized > 2 times or patient unable to void post epidural plac...   Refer to Sidebar Report Urinary (Foley) Catheter Indications   Refer to Sidebar Report Post Indwelling Urinary Catheter Removal and Intervention Guidelines   Discontinue foley prior to vaginal delivery   Initiate Oral Care Protocol   Initiate Carrier Fluid Protocol   Full code   POCT fern test   Type and screen [facility]   Insert and maintain IV Line   Admit to Inpatient (patient's expected length of stay will be greater than 2 midnights or inpatient only procedure)   Meds ordered this encounter  Medications   DISCONTD: valACYclovir (VALTREX) tablet 500 mg   lactated ringers infusion   oxytocin (PITOCIN) IV BOLUS FROM BAG   oxytocin (PITOCIN) IV infusion 30 units in NS 500 mL - Premix   lactated ringers infusion 500-1,000 mL   acetaminophen (TYLENOL) tablet 650 mg   oxyCODONE-acetaminophen (PERCOCET/ROXICET) 5-325 MG per tablet 1 tablet   oxyCODONE-acetaminophen (PERCOCET/ROXICET) 5-325 MG per tablet 2 tablet   ondansetron (ZOFRAN) injection 4 mg   sodium citrate-citric acid (ORACIT) solution 30 mL   lidocaine (PF) (XYLOCAINE) 1 % injection 30 mL   valACYclovir (VALTREX) tablet 500 mg    ASSESSMENT  Intact membranes. Fetal movement present.  [redacted] weeks gestation.  Patient with possible ROM. Rule out rupture with fern, speculum, and ROM plus. Contractions present.  FHT concerning for initial minimal variability and late decelerations.  PLAN  Call to L&D provider with report of concern regarding initial minimal variability of FHT and late decelerations. Admit to L&D 2/2 FHT and planned IOL today. Care assumed by L&D provider.      Lamont Snowball, MSN,  CNM, RNC-OB Certified Nurse Midwife, [facility] 12/17/2023 12:00 PM

## 2023-12-17 NOTE — H&P (Signed)
 Maria Morgan is a 33 y.o. female G3P2002  at [redacted]w[redacted]d presenting for labor evaluation, admitted with nonreassuring FHR tracing.  Hx significant for pituitary tumor, last MRI 2013-2014, wnl, no s/sx.  Hx HSV, on Valtrex. No s/sx of outbreak on admission.    NURSING  PROVIDER  Office Location [place] Dating by U/S at 6 wks  [facility] Model Traditional Anatomy U/S Normal, repeat to complete anatomy  Initiated care at  11wks                Language  English              LAB RESULTS   Support Person  Genetics NIPS: insufficient DNA AFP: screen neg    Carrier Screen Horizon:   Rhogam  A/Positive/-- (08/20 0950) A1C/GTT Early: nml a1c Third trimester: Nml 2 hr GTT  Flu Vaccine     TDaP Vaccine 10/10/23 Blood Type A/Positive/-- (08/20 0950)  Covid Vaccine  Antibody Negative (08/20 0950)  RSV Vaccine  Rubella 2.31 (08/20 0950)  Feeding Plan Breast RPR Non Reactive (08/20 0950)  Contraception NFP or interval IUD HBsAg Negative (08/20 0950)  Circumcision Yes HIV Non Reactive (08/20 0950)  Pediatrician   HCVAb Non Reactive (08/20 0950)  Prenatal Classes No      Pap Diagnosis  Date Value Ref Range Status  06/03/2023   Final   - Negative for intraepithelial lesion or malignancy (NILM)    BTLConsent  GC/CT Initial:   36wks:    VBAC  Consent  GBS   For PCN allergy, check sensitivities        DME Rx [ ]  BP cuff [ ]  Weight Scale Waterbirth  [ ]  Class [ ]  Consent [ ]  CNM visit  PHQ9 & GAD7 [  ] new OB [  ] 28 weeks  [  ] 36 weeks Induction  [ ]  Orders Entered [ ] Foley Y/N     OB History     Gravida  3   Para  2   Term  2   Preterm      AB      Living  2      SAB      IAB      Ectopic      Multiple      Live Births  2          Past Medical History:  Diagnosis Date   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 Social History:  reports that she has never smoked. She has never used smokeless tobacco. She reports that she does not currently use alcohol. She reports that she does not use drugs.     Maternal Diabetes: No Genetic Screening: Abnormal:  Results: Other: Maternal Ultrasounds/Referrals: Normal Fetal Ultrasounds or other Referrals:  None Maternal Substance Abuse:  No Significant Maternal Medications:  Meds include: Other:  Significant Maternal Lab Results:  Group B Strep negative Number of Prenatal Visits:greater than 3 verified prenatal visits Maternal Vaccinations:TDap Other Comments:   NIPS with insufficient DNA, Meds include Valtrex  Review of Systems Maternal Medical History:  Reason for admission: FHR decelerations  Contractions: Onset was 3-5 hours ago.   Frequency: irregular.   Perceived severity is moderate.   Fetal activity: Perceived fetal activity is normal.   Prenatal complications: no prenatal complications Prenatal Complications - Diabetes: none.   Dilation: 1 Effacement (%): 70 Station: -3 Exam by:: S [place] CNM Blood pressure (!) 135/96, pulse 98, temperature (!) 97.5 F (36.4 C), temperature source Oral, resp. rate 18, last menstrual period 03/10/2023, SpO2 99%. Maternal Exam:  Uterine Assessment: Contraction strength is moderate.  Contraction frequency is irregular.  Abdomen: Fetal presentation: vertex Cervix: Cervix evaluated by digital exam.     Fetal Exam Fetal Monitor Review: Mode: ultrasound.   Baseline rate: 140.  Variability: moderate (6-25 bpm).   Pattern: accelerations present and late decelerations.   Fetal State Assessment: Category II - tracings are indeterminate.   Physical  Exam Vitals and nursing note reviewed.  Constitutional:      Appearance: She is well-developed.  Cardiovascular:     Rate and Rhythm: Normal rate.     Heart sounds: Normal heart sounds.  Pulmonary:     Effort: Pulmonary effort is normal.     Breath sounds: Normal breath sounds.  Abdominal:     Palpations: Abdomen is soft.  Musculoskeletal:        General: Normal range of motion.     Cervical back: Normal range of motion.  Skin:    General: Skin is warm and dry.  Neurological:     Mental Status: She is alert and oriented to person, place, and time.  Psychiatric:        Behavior: Behavior normal.        Thought Content: Thought content normal.        Judgment: Judgment normal.    SSE, no evidence of HSV lesions on exam.  Prenatal labs: ABO, Rh: A/Positive/-- (08/20 0950) Antibody: Negative (08/20 0950) Rubella: 2.31 (08/20 0950) RPR: Non Reactive (12/27 0947)  HBsAg: Negative (08/20 0950)  HIV: Non Reactive (12/27 0947)  GBS: Negative/-- (02/14 9147)   Assessment/Plan: [id number]  at 108w6d admitted for FHR decelerations GBS negative  Admit to L&D Plans NFP vs interval IUD for contraception Breastfeeding   Sharen Counter 12/17/2023, 10:50 AM

## 2023-12-17 NOTE — Anesthesia Preprocedure Evaluation (Signed)
 Anesthesia Evaluation  Patient identified by MRN, date of birth, ID band Patient awake    Reviewed: Allergy & Precautions, Patient's Chart, lab work & pertinent test results  History of Anesthesia Complications Negative for: history of anesthetic complications  Airway Mallampati: II  TM Distance: >3 FB Neck ROM: Full    Dental no notable dental hx.    Pulmonary neg pulmonary ROS   Pulmonary exam normal        Cardiovascular negative cardio ROS Normal cardiovascular exam     Neuro/Psych Pituitary adenoma    GI/Hepatic negative GI ROS, Neg liver ROS,,,  Endo/Other    Class 3 obesity  Renal/GU negative Renal ROS  negative genitourinary   Musculoskeletal negative musculoskeletal ROS (+)    Abdominal   Peds  Hematology negative hematology ROS (+)   Anesthesia Other Findings Day of surgery medications reviewed with patient.  Reproductive/Obstetrics (+) Pregnancy                              Anesthesia Physical Anesthesia Plan  ASA: 3  Anesthesia Plan: Epidural   Post-op Pain Management:    Induction:   PONV Risk Score and Plan: Treatment may vary due to age or medical condition  Airway Management Planned: Natural Airway  Additional Equipment: Fetal Monitoring  Intra-op Plan:   Post-operative Plan:   Informed Consent: I have reviewed the patients History and Physical, chart, labs and discussed the procedure including the risks, benefits and alternatives for the proposed anesthesia with the patient or authorized representative who has indicated his/her understanding and acceptance.       Plan Discussed with:   Anesthesia Plan Comments:          Anesthesia Quick Evaluation

## 2023-12-18 ENCOUNTER — Encounter (HOSPITAL_COMMUNITY): Payer: Self-pay | Admitting: Family Medicine

## 2023-12-18 DIAGNOSIS — O368330 Maternal care for abnormalities of the fetal heart rate or rhythm, third trimester, not applicable or unspecified: Secondary | ICD-10-CM

## 2023-12-18 DIAGNOSIS — O480 Post-term pregnancy: Secondary | ICD-10-CM

## 2023-12-18 DIAGNOSIS — Z3A40 40 weeks gestation of pregnancy: Secondary | ICD-10-CM

## 2023-12-18 DIAGNOSIS — O4103X0 Oligohydramnios, third trimester, not applicable or unspecified: Secondary | ICD-10-CM

## 2023-12-18 MED ORDER — ONDANSETRON HCL 4 MG/2ML IJ SOLN: 4.0000 mg | INTRAMUSCULAR | Status: DC | PRN

## 2023-12-18 MED ORDER — DIPHENHYDRAMINE HCL 25 MG PO CAPS: 25.0000 mg | ORAL_CAPSULE | Freq: Four times a day (QID) | ORAL | Status: DC | PRN

## 2023-12-18 MED ORDER — PRENATAL MULTIVITAMIN CH
1.0000 | ORAL_TABLET | Freq: Every day | ORAL | Status: DC
  Administered 2023-12-18 – 2023-12-20 (×3): 1 via ORAL
  Filled 2023-12-18 (×2): qty 1

## 2023-12-18 MED ORDER — SIMETHICONE 80 MG PO CHEW
80.0000 mg | CHEWABLE_TABLET | ORAL | Status: DC | PRN
  Administered 2023-12-19: 80 mg via ORAL
  Filled 2023-12-18: qty 1

## 2023-12-18 MED ORDER — IBUPROFEN 600 MG PO TABS
600.0000 mg | ORAL_TABLET | Freq: Four times a day (QID) | ORAL | Status: DC
  Administered 2023-12-18 – 2023-12-20 (×10): 600 mg via ORAL
  Filled 2023-12-18 (×9): qty 1

## 2023-12-18 MED ORDER — ACETAMINOPHEN 325 MG PO TABS
650.0000 mg | ORAL_TABLET | ORAL | Status: DC | PRN
  Administered 2023-12-18 – 2023-12-20 (×5): 650 mg via ORAL
  Filled 2023-12-18 (×5): qty 2

## 2023-12-18 MED ORDER — WITCH HAZEL-GLYCERIN EX PADS: 1.0000 | MEDICATED_PAD | CUTANEOUS | Status: DC | PRN

## 2023-12-18 MED ORDER — DIBUCAINE (PERIANAL) 1 % EX OINT: 1.0000 | TOPICAL_OINTMENT | CUTANEOUS | Status: DC | PRN

## 2023-12-18 MED ORDER — COCONUT OIL OIL: 1.0000 | TOPICAL_OIL | Status: DC | PRN

## 2023-12-18 MED ORDER — TETANUS-DIPHTH-ACELL PERTUSSIS 5-2.5-18.5 LF-MCG/0.5 IM SUSY: 0.5000 mL | PREFILLED_SYRINGE | Freq: Once | INTRAMUSCULAR | Status: DC

## 2023-12-18 MED ORDER — SENNOSIDES-DOCUSATE SODIUM 8.6-50 MG PO TABS
2.0000 | ORAL_TABLET | Freq: Every day | ORAL | Status: DC
  Administered 2023-12-19 – 2023-12-20 (×2): 2 via ORAL
  Filled 2023-12-18: qty 2

## 2023-12-18 MED ORDER — ZOLPIDEM TARTRATE 5 MG PO TABS: 5.0000 mg | ORAL_TABLET | Freq: Every evening | ORAL | Status: DC | PRN

## 2023-12-18 MED ORDER — MEDROXYPROGESTERONE ACETATE 150 MG/ML IM SUSP: 150.0000 mg | INTRAMUSCULAR | Status: DC | PRN

## 2023-12-18 MED ORDER — BENZOCAINE-MENTHOL 20-0.5 % EX AERO
1.0000 | INHALATION_SPRAY | CUTANEOUS | Status: DC | PRN
  Filled 2023-12-18: qty 56

## 2023-12-18 MED ORDER — OXYCODONE HCL 5 MG PO TABS
5.0000 mg | ORAL_TABLET | ORAL | Status: DC | PRN
  Administered 2023-12-18: 5 mg via ORAL
  Filled 2023-12-18: qty 1

## 2023-12-18 MED ORDER — ONDANSETRON HCL 4 MG PO TABS: 4.0000 mg | ORAL_TABLET | ORAL | Status: DC | PRN

## 2023-12-18 MED ORDER — MEASLES, MUMPS & RUBELLA VAC IJ SOLR: 0.5000 mL | Freq: Once | INTRAMUSCULAR | Status: DC

## 2023-12-18 MED ORDER — FERROUS SULFATE 325 (65 FE) MG PO TABS
325.0000 mg | ORAL_TABLET | ORAL | Status: DC
  Administered 2023-12-19: 325 mg via ORAL
  Filled 2023-12-18: qty 1

## 2023-12-18 NOTE — Discharge Summary (Addendum)
 Postpartum Discharge Summary  Date of Service updated 12/20/2023     Patient Name: Maria Morgan DOB: 09/06/1991 MRN: [id number]  Date of admission: 12/17/2023 Delivery date:12/18/2023 Delivering provider: Herminio Commons Date of discharge: 12/20/2023  Admitting diagnosis: NST (non-stress test) nonreactive [O28.8] Intrauterine pregnancy: [redacted]w[redacted]d     Secondary diagnosis:  Principal Problem:   SVD (spontaneous vaginal delivery) Active Problems:   Pituitary adenoma (HCC)   Maternal morbid obesity, antepartum (HCC)   Supervision of high risk pregnancy, antepartum, third trimester   Oligohydramnios  Additional problems: none    Discharge diagnosis: Term Pregnancy Delivered                                              Post partum procedures: none Augmentation: Cytotec and IP Foley Complications: None  Hospital course: Induction of Labor With Vaginal Delivery   33 y.o. yo G3P3003 at [redacted]w[redacted]d was admitted to the hospital 12/17/2023 for induction of labor.  Indication for induction:  oligo with non-reactive NST .  Patient had an uncomplicated labor course. Membrane Rupture Time/Date: 7:30 PM,12/17/2023  Delivery Method:Vaginal, Spontaneous Operative Delivery:N/A Episiotomy: None Lacerations:    Details of delivery can be found in separate delivery note.  Patient had a postpartum course complicated by none. Patient is discharged home 12/20/23.  Newborn Data: Birth date:12/18/2023 Birth time:12:16 AM Gender:Female Living status:Living Apgars:8 ,9  Weight:3540 g (7lb 12.9oz)  Magnesium Sulfate received: No BMZ received: No Rhophylac:N/A MMR:N/A T-DaP:Given prenatally Flu: No RSV Vaccine received: No Transfusion:No  Immunizations received: Immunization History  Administered Date(s) Administered   DTaP 08/23/1991, 10/26/1991, 12/27/1991, 10/23/1992, 11/19/1995   HIB (PRP-T) 08/23/1991, 10/26/1991, 12/27/1991, 10/23/1992   HPV Quadrivalent 11/23/2007, 01/21/2008, 02/01/2009    Hepatitis A, Adult 10/31/2021   Hepatitis B, PED/ADOLESCENT 11/19/1995, 05/25/1996, 05/27/1997   IPV 08/23/1991, 10/26/1991, 10/23/1992, 11/19/1995   Influenza, Seasonal, Injecte, Preservative Fre 08/05/2013   Influenza,inj,Quad PF,6+ Mos 08/05/2013   MMR 10/23/1992, 11/19/1995   Meningococcal Mcv4o 10/31/2021   PPD Test 10/22/2021, 10/22/2021   Tdap 02/03/2009, 01/11/2013, 03/28/2017, 10/10/2023   Typhoid Inactivated 10/31/2021   Yellow Fever 10/31/2021    Physical exam  Vitals:   12/19/23 0519 12/19/23 1715 12/19/23 2237 12/20/23 0531  BP: 122/83 117/84 137/83 124/84  Pulse: 93 88 93 94  Resp: 18 18 17 19   Temp: 97.8 F (36.6 C) 98 F (36.7 C) 98.3 F (36.8 C) 98.2 F (36.8 C)  TempSrc:   Oral Oral  SpO2: 99% 98%  100%   General: alert, cooperative, and no distress Lochia: appropriate Uterine Fundus: firm Incision: N/A DVT Evaluation: No evidence of DVT seen on physical exam.2+ edema Labs: Lab Results  Component Value Date   WBC 3.6 (L) 12/17/2023   HGB 12.7 12/17/2023   HCT 36.7 12/17/2023   MCV 87.0 12/17/2023   PLT 167 12/17/2023      Latest Ref Rng & Units 10/03/2023   10:24 AM  CMP  Glucose 70 - 99 mg/dL 409   BUN 6 - 20 mg/dL 6   Creatinine 8.11 - 9.14 mg/dL 7.82   Sodium 956 - 213 mmol/L 132   Potassium 3.5 - 5.1 mmol/L 3.5   Chloride 98 - 111 mmol/L 108   CO2 22 - 32 mmol/L 18   Calcium 8.9 - 10.3 mg/dL 8.6   Total Protein 6.5 - 8.1 g/dL 6.4   Total  Bilirubin <1.2 mg/dL 0.4   Alkaline Phos 38 - 126 U/L 52   AST 15 - 41 U/L 17   ALT 0 - 44 U/L 19    Edinburgh Score:    12/19/2023    5:15 PM  Edinburgh Postnatal Depression Scale Screening Tool  I have been able to laugh and see the funny side of things. 0  I have looked forward with enjoyment to things. 0  I have blamed myself unnecessarily when things went wrong. 1  I have been anxious or worried for no good reason. 0  I have felt scared or panicky for no good reason. 0  Things have been  getting on top of me. 0  I have been so unhappy that I have had difficulty sleeping. 0  I have felt sad or miserable. 0  I have been so unhappy that I have been crying. 0  The thought of harming myself has occurred to me. 0  Edinburgh Postnatal Depression Scale Total 1   Edinburgh Postnatal Depression Scale Total: 1   After visit meds:  Allergies as of 12/20/2023       Reactions   Iodine Shortness Of Breath, Itching   Contrast in IV        Medication List     STOP taking these medications    folic acid 1 MG tablet Commonly known as: FOLVITE   valACYclovir 500 MG tablet Commonly known as: VALTREX       TAKE these medications    furosemide 20 MG tablet Commonly known as: Lasix Take 1 tablet (20 mg total) by mouth daily for 5 days.   ibuprofen 600 MG tablet Commonly known as: ADVIL Take 1 tablet (600 mg total) by mouth every 6 (six) hours as needed for mild pain (pain score 1-3) or moderate pain (pain score 4-6).   oxyCODONE 5 MG immediate release tablet Commonly known as: Oxy IR/ROXICODONE Take 1 tablet (5 mg total) by mouth every 6 (six) hours as needed for up to 3 days for severe pain (pain score 7-10) or moderate pain (pain score 4-6).   Prenatal Vitamin 27-0.8 MG Tabs Take 1 capsule by mouth daily.   senna-docusate 8.6-50 MG tablet Commonly known as: Senokot-S Take 2 tablets by mouth at bedtime as needed for mild constipation or moderate constipation.         Discharge home in stable condition Infant Feeding: Bottle and Breast Infant Disposition:home with mother Discharge instruction: per After Visit Summary and Postpartum booklet. Activity: Advance as tolerated. Pelvic rest for 6 weeks.  Diet: routine diet Future Appointments: Future Appointments  Date Time Provider Department Center  01/26/2024 10:55 AM Dunn, Gillian Scarce, MD [facility] None   Follow up Visit:  Follow-up Information     [facility] [place] Follow up  in 4 week(s).   Specialty: Obstetrics and Gynecology Contact information: [address] [place] [address] [phone]                Arabella Merles, CNM  P [facility] Admin Please schedule this patient for Postpartum visit in: 4 weeks with the following provider: Any provider In-Person For C/S patients schedule nurse incision check in weeks 2 weeks: no High risk pregnancy complicated by: oligo Delivery mode:  SVD Anticipated Birth Control:  NFP/interval LARC PP Procedures needed: none Schedule Integrated BH visit: no   12/20/2023 Scheryl Darter, MD

## 2023-12-18 NOTE — Progress Notes (Signed)
 Post Partum Day 0 Subjective: no complaints and tolerating PO, not ambulating much due to legs still being numb after epidural. Awaiting to spontaneously void, last void was via in and out cath. No other concerns at this time.   Objective: Blood pressure 121/73, pulse 77, temperature 97.8 F (36.6 C), resp. rate 18, last menstrual period 03/10/2023, SpO2 100%, unknown if currently breastfeeding.  Physical Exam:  General: alert and cooperative Lochia: appropriate Uterine Fundus: firm DVT Evaluation: No evidence of DVT seen on physical exam.  Recent Labs    12/17/23 1058  HGB 12.7  HCT 36.7    Assessment/Plan:  TSVD PPD 0  Breastfeeding and Circumcision prior to discharge, continue scheduled tylenol and ibuprofen prn, nexplanon for contraception   LOS: 1 day   Herminio Commons, Student-MidWife 12/18/2023, 8:29 AM

## 2023-12-18 NOTE — Anesthesia Postprocedure Evaluation (Signed)
 Anesthesia Post Note  Patient: Corporate investment banker  Procedure(s) Performed: AN AD HOC LABOR EPIDURAL     Patient location during evaluation: Mother Baby Anesthesia Type: Epidural Level of consciousness: awake and alert and oriented Pain management: satisfactory to patient Vital Signs Assessment: post-procedure vital signs reviewed and stable Respiratory status: respiratory function stable Cardiovascular status: stable Postop Assessment: no headache, no backache, epidural receding, patient able to bend at knees, no signs of nausea or vomiting and adequate PO intake Anesthetic complications: no   No notable events documented.  Last Vitals:  Vitals:   12/18/23 0343 12/18/23 0747  BP: 135/77 121/73  Pulse: 89 77  Resp: 18   Temp: 36.6 C   SpO2: 100%     Last Pain:  Vitals:   12/18/23 0624  TempSrc:   PainSc: 6    Pain Goal:                   Karleen Dolphin

## 2023-12-19 MED ORDER — OXYCODONE HCL 5 MG PO TABS: 5.0000 mg | ORAL_TABLET | Freq: Four times a day (QID) | ORAL | 0 refills | Status: DC | PRN

## 2023-12-19 MED ORDER — IBUPROFEN 600 MG PO TABS: 600.0000 mg | ORAL_TABLET | Freq: Four times a day (QID) | ORAL | 1 refills | Status: DC | PRN

## 2023-12-19 MED ORDER — ACETAMINOPHEN 325 MG PO TABS: 650.0000 mg | ORAL_TABLET | ORAL | 1 refills | Status: DC | PRN

## 2023-12-19 MED ORDER — SENNOSIDES-DOCUSATE SODIUM 8.6-50 MG PO TABS: 2.0000 | ORAL_TABLET | Freq: Every evening | ORAL | 1 refills | Status: DC | PRN

## 2023-12-19 NOTE — Lactation Note (Signed)
 This note was copied from a baby's chart. Lactation Consultation Note  Patient Name: Maria Morgan [id number] Date: 12/19/2023 Age:33 hours Reason for consult: Initial assessment;Term;Maternal endocrine disorder;Breastfeeding assistance (hx HSV-valtrex)  P3- [name] reports that infant was nursing well until today after his circumcision. LC reviewed how it is normal for a Maria infant to be sleepy after a circ. LC offered to assist with a latch. [name] in agreement. LC placed infant on the left breast in the cross cradle hold. Infant was eager and latched immediately. Infant nursed for 7 minutes with some external stimulation needed to sustain. When LC left, infant was still nursing on the breast. [name] denied having further questions or concerns.  LC reviewed feeding infant on cue 8-12x in 24 hrs, not allowing infant to go over 3 hrs without a feeding, CDC milk storage guidelines, LC services handout, engorgement/breast care and cluster feeding for day 2 of life. LC encouraged [name] to call for further assistance as needed.  Maternal Data Has patient been taught Hand Expression?: No Does the patient have breastfeeding experience prior to this delivery?: Yes How long did the patient breastfeed?: 4 months with first child and 6 months with second child  Feeding Mother's Current Feeding Choice: Breast Milk  LATCH Score Latch: Repeated attempts needed to sustain latch, nipple held in mouth throughout feeding, stimulation needed to elicit sucking reflex.  Audible Swallowing: A few with stimulation  Type of Nipple: Everted at rest and after stimulation  Comfort (Breast/Nipple): Soft / non-tender  Hold (Positioning): Assistance needed to correctly position infant at breast and maintain latch.  LATCH Score: 7   Lactation Tools Discussed/Used Pump Education: Milk Storage  Interventions Interventions: Breast feeding basics reviewed;Assisted with latch;Breast compression;Adjust position;Support  pillows;Position options;Education;LC Services brochure  Discharge Discharge Education: Engorgement and breast care;Warning signs for feeding baby Pump: Hands Free;Personal  Consult Status Consult Status: Follow-up Date: 12/20/23 Follow-up type: In-patient    Dema Severin BS, IBCLC 12/19/2023, 3:59 PM

## 2023-12-20 MED ORDER — SENNOSIDES-DOCUSATE SODIUM 8.6-50 MG PO TABS
2.0000 | ORAL_TABLET | Freq: Every evening | ORAL | 1 refills | Status: AC | PRN
Start: 2023-12-20 — End: ?

## 2023-12-20 MED ORDER — FUROSEMIDE 20 MG PO TABS: 20.0000 mg | ORAL_TABLET | Freq: Every day | ORAL | 0 refills | Status: DC

## 2023-12-20 MED ORDER — OXYCODONE HCL 5 MG PO TABS: 5.0000 mg | ORAL_TABLET | Freq: Four times a day (QID) | ORAL | 0 refills | Status: AC | PRN

## 2023-12-20 MED ORDER — IBUPROFEN 600 MG PO TABS: 600.0000 mg | ORAL_TABLET | Freq: Four times a day (QID) | ORAL | 1 refills | Status: DC | PRN

## 2023-12-20 MED ORDER — FUROSEMIDE 20 MG PO TABS
20.0000 mg | ORAL_TABLET | Freq: Two times a day (BID) | ORAL | 0 refills | Status: DC
Start: 2023-12-20 — End: 2023-12-20

## 2023-12-20 NOTE — Lactation Note (Signed)
 This note was copied from a baby's chart. Lactation Consultation Note  Patient Name: Maria Morgan [id number] Date: 12/20/2023 Age:34 hours, P3  Reason for consult: Follow-up assessment;Maternal endocrine disorder;Term;Infant weight loss As LC entered the room the baby latched on the left breast, modified cross cradle with depth and multiple swallows. Baby fed 10 mins and released ,  Baby still hungry and LC assisted to latch on the right breast, football and baby still feeding.  LC reviewed engorgement prevention and tx . Milk is both breast , borderline over full .  RN provided ice for the mom to apply after feeding.  Mom aware of the [facility] resources.    Maternal Data Has patient been taught Hand Expression?: Yes Does the patient have breastfeeding experience prior to this delivery?: Yes  Feeding Mother's Current Feeding Choice: Breast Milk  LATCH Score Latch:  (latched on the left modified cross cradle)  Audible Swallowing:  (swallows noted)  Type of Nipple:  (nipple well rounded when baby released)  Comfort (Breast/Nipple):  (per mom comfortable with latch, uncomfortable with the over fullness , RN provided ice for the breast after breast feeding)  Hold (Positioning):  (mom had latched the baby)  LATCH Score: 8   Lactation Tools Discussed/Used  Hand pump and DEBP   Interventions Interventions: Breast feeding basics reviewed;DEBP;Hand pump;Education;CDC Guidelines for Breast Pump Cleaning;CDC milk storage guidelines  Discharge Discharge Education: Engorgement and breast care;Warning signs for feeding baby Pump: Manual;Personal;Hands Free  Consult Status Consult Status: Complete Date: 12/20/23    Kathrin Greathouse 12/20/2023, 12:52 PM

## 2023-12-20 NOTE — Progress Notes (Signed)
 Post Partum Day 2 Subjective: no complaints and tolerating PO  Objective: Blood pressure 124/84, pulse 94, temperature 98.2 F (36.8 C), temperature source Oral, resp. rate 19, last menstrual period 03/10/2023, SpO2 100%, unknown if currently breastfeeding.  Physical Exam:  General: alert, cooperative, and no distress Lochia: appropriate Uterine Fundus: firm Incision:  DVT Evaluation: No evidence of DVT seen on physical exam.  Recent Labs    12/17/23 1058  HGB 12.7  HCT 36.7    Assessment/Plan: Discharge summary completed   LOS: 3 days   Scheryl Darter, MD 12/20/2023, 10:38 AM

## 2023-12-26 ENCOUNTER — Telehealth: Payer: Self-pay

## 2023-12-26 NOTE — Telephone Encounter (Signed)
 Patient was concerned that bleeding had increased bleeding and passing clots.  Changes pad q 3 hrs.  Instructed patient to be mindful of activity.  Uterus will relax if on her feet to much.  Need to rest.  If bleeding increases and going through pad an hour then needs to be evaluated at MAU. Danna Hefty [employer]

## 2023-12-27 ENCOUNTER — Telehealth (HOSPITAL_COMMUNITY): Payer: Self-pay

## 2023-12-27 NOTE — Telephone Encounter (Signed)
 12/27/2023 1009  Name: Maria Morgan MRN: [id number] DOB: December 09, 1990  Reason for Call:  Transition of Care Hospital Discharge Call  Contact Status: Patient Contact Status: Complete  Language assistant needed:          Follow-Up Questions: Do You Have Any Concerns About Your Health As You Heal From Delivery?: Yes What Concerns Do You Have About Your Health?: Patient states that she called her OB yesterday because she had concerns about her bleeding. She states that she was passing some clots. She states that her bleeding is ok now. RN reviewed what to much bleeding looks like and when to call her OB-GYN. RN also told patient to remember to empty her bladder every 2-3 hours and to take it easy with her activity. Patient also concerned about swelling in her feet. RN reviewed fluid shifts that take place after delivery. RN also reviewed preeclampsia and warning signs. Patient declines having any preeclampsia symptoms. Patient has no other concerns or questions. Do You Have Any Concerns About Your Infants Health?: No  Edinburgh Postnatal Depression Scale:  In the Past 7 Days:    PHQ2-9 Depression Scale:     Discharge Follow-up: Edinburgh score requires follow up?:  (RN explained EPDS to patient. Patient declines EPDS and states that she is fine. Patient states that she has a nurse home visit next week. RN explained that if concerns should arise to reach out to her OB-GYN.) Patient was advised of the following resources:: Support Group, Breastfeeding Support Group  Post-discharge interventions: Reviewed Newborn Safe Sleep Practices  Signature  Signe Colt

## 2023-12-28 ENCOUNTER — Inpatient Hospital Stay (HOSPITAL_COMMUNITY)

## 2023-12-28 ENCOUNTER — Encounter (HOSPITAL_COMMUNITY): Payer: Self-pay | Admitting: Obstetrics and Gynecology

## 2023-12-28 ENCOUNTER — Inpatient Hospital Stay (HOSPITAL_COMMUNITY)
Admission: AD | Admit: 2023-12-28 | Discharge: 2023-12-28 | Disposition: A | Discharge status: Home / Self Care | Attending: Obstetrics and Gynecology | Admitting: Obstetrics and Gynecology

## 2023-12-28 DIAGNOSIS — R109 Unspecified abdominal pain: Secondary | ICD-10-CM | POA: Diagnosis present

## 2023-12-28 DIAGNOSIS — K5909 Other constipation: Secondary | ICD-10-CM | POA: Diagnosis not present

## 2023-12-28 DIAGNOSIS — O9089 Other complications of the puerperium, not elsewhere classified: Secondary | ICD-10-CM | POA: Diagnosis not present

## 2023-12-28 LAB — COMPREHENSIVE METABOLIC PANEL
ALT: 16 U/L (ref 0–44)
AST: 15 U/L (ref 15–41)
Albumin: 2.8 g/dL — ABNORMAL LOW (ref 3.5–5.0)
Alkaline Phosphatase: 47 U/L (ref 38–126)
Anion gap: 8 (ref 5–15)
BUN: 8 mg/dL (ref 6–20)
CO2: 23 mmol/L (ref 22–32)
Calcium: 8.8 mg/dL — ABNORMAL LOW (ref 8.9–10.3)
Chloride: 108 mmol/L (ref 98–111)
Creatinine, Ser: 0.87 mg/dL (ref 0.44–1.00)
GFR, Estimated: >60 mL/min (ref >60)
Glucose, Bld: 94 mg/dL (ref 70–99)
Potassium: 4.3 mmol/L (ref 3.5–5.1)
Sodium: 139 mmol/L (ref 135–145)
Total Bilirubin: 0.7 mg/dL (ref 0.0–1.2)
Total Protein: 6.1 g/dL — ABNORMAL LOW (ref 6.5–8.1)

## 2023-12-28 LAB — CBC WITH DIFFERENTIAL/PLATELET
Abs Immature Granulocytes: 0.01 10*3/uL (ref 0.00–0.07)
Basophils Absolute: 0 10*3/uL (ref 0.0–0.1)
Basophils Relative: 0 %
Eosinophils Absolute: 0.2 10*3/uL (ref 0.0–0.5)
Eosinophils Relative: 3 %
HCT: 38.4 % (ref 36.0–46.0)
Hemoglobin: 13.1 g/dL (ref 12.0–15.0)
Immature Granulocytes: 0 %
Lymphocytes Relative: 15 %
Lymphs Abs: 1.1 10*3/uL (ref 0.7–4.0)
MCH: 30 pg (ref 26.0–34.0)
MCHC: 34.1 g/dL (ref 30.0–36.0)
MCV: 88.1 fL (ref 80.0–100.0)
Monocytes Absolute: 0.4 10*3/uL (ref 0.1–1.0)
Monocytes Relative: 6 %
Neutro Abs: 5.3 10*3/uL (ref 1.7–7.7)
Neutrophils Relative %: 76 %
Platelets: 222 10*3/uL (ref 150–400)
RBC: 4.36 MIL/uL (ref 3.87–5.11)
RDW: 12.3 % (ref 11.5–15.5)
WBC: 7 10*3/uL (ref 4.0–10.5)
nRBC: 0 % (ref 0.0–0.2)

## 2023-12-28 LAB — URINALYSIS, ROUTINE W REFLEX MICROSCOPIC
Bacteria, UA: NONE SEEN
Bilirubin Urine: NEGATIVE
Glucose, UA: NEGATIVE mg/dL
Hgb urine dipstick: NEGATIVE
Ketones, ur: NEGATIVE mg/dL
Nitrite: NEGATIVE
Protein, ur: NEGATIVE mg/dL
Specific Gravity, Urine: 1.014 (ref 1.005–1.030)
pH: 6 (ref 5.0–8.0)

## 2023-12-28 MED ORDER — SMOG ENEMA
960.0000 mL | Freq: Once | RECTAL | Status: AC
Start: 2023-12-28 — End: 2023-12-28
  Administered 2023-12-28: 960 mL via RECTAL
  Filled 2023-12-28: qty 960

## 2023-12-28 MED ORDER — KETOROLAC TROMETHAMINE 30 MG/ML IJ SOLN
60.0000 mg | Freq: Once | INTRAMUSCULAR | Status: AC
  Administered 2023-12-28: 60 mg via INTRAMUSCULAR
  Filled 2023-12-28: qty 2

## 2023-12-28 MED ORDER — KETOROLAC TROMETHAMINE 30 MG/ML IJ SOLN
30.0000 mg | Freq: Once | INTRAMUSCULAR | Status: AC
Start: 2023-12-28 — End: 2023-12-28
  Administered 2023-12-28: 30 mg via INTRAMUSCULAR
  Filled 2023-12-28: qty 1

## 2023-12-28 NOTE — MAU Provider Note (Signed)
 History     CSN: [id number]  Arrival date and time: 12/28/23 1223   Event Date/Time   First Provider Initiated Contact with Patient 12/28/23 1306      Chief Complaint  Patient presents with   Abdominal Pain   HPI Maria Morgan is a 33 y.o. G24P3003 female who presents 10 days post partum for abdominal pain. She is s/p uncomplicated SVD on 3/6. Reports abdominal pain since being discharged from the hospital that significantly worsened yesterday. Describes pain as contractions that make it difficult to walk. Has also passed a few blood clots vs tissue. Denies increase in bleeding. Denies n/v/d, dysuria, fever/chills. Last BM was yesterday, does not think she is constipated. Has had some episodes of dizziness, worse with standing for extended periods. Admits to not eating much since she's been home. Drinks about 30 oz of water per day.   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   Benign neoplasm of colon 02/20/2012   Brain tumor (benign) (HCC)    Chlamydia    Gonorrhea    Hyperprolactinemia (HCC) 09/26/2015   Malaria    Obesity, Class III, BMI 40-49.9 (morbid obesity) (HCC) 02/11/2022    Past Surgical History:  Procedure Laterality Date   NO PAST SURGERIES      Family History  Problem Relation Age of Onset   Cancer Mother    Diabetes Paternal Grandfather    Diabetes Other     Social History   Tobacco Use   Smoking status: Never   Smokeless tobacco: Never  Vaping Use   Vaping status: Never Used  Substance Use Topics   Alcohol use: Not Currently   Drug use: No    Allergies:  Allergies  Allergen Reactions   Iodine Shortness Of Breath and Itching    Contrast in IV    Medications Prior to Admission  Medication Sig Dispense Refill Last  Dose/Taking   acetaminophen (TYLENOL) 500 MG tablet Take 1,000 mg by mouth every 6 (six) hours as needed.   12/28/2023 Morning   ibuprofen (ADVIL) 600 MG tablet Take 1 tablet (600 mg total) by mouth every 6 (six) hours as needed for mild pain (pain score 1-3) or moderate pain (pain score 4-6). 60 tablet 1 Past Week   Prenatal Vit-Fe Fumarate-FA (PRENATAL VITAMIN) 27-0.8 MG TABS Take 1 capsule by mouth daily. 30 tablet 0 12/28/2023 Morning   furosemide (LASIX) 20 MG tablet Take 1 tablet (20 mg total) by mouth daily for 5 days. 7 tablet 0    senna-docusate (SENOKOT-S) 8.6-50 MG tablet Take 2 tablets by mouth at bedtime as needed for mild constipation or moderate constipation. 60 tablet 1     Review of Systems  All other systems reviewed and are negative.  Physical Exam   Blood pressure 129/86, pulse 89, temperature 98.4 F (36.9 C), temperature source Oral, resp. rate 16, SpO2 100%, currently breastfeeding.  Physical Exam Vitals and nursing note reviewed.  Constitutional:      General: She is not in acute distress.  Appearance: She is well-developed. She is not ill-appearing.  HENT:     Head: Normocephalic and atraumatic.  Eyes:     General: No scleral icterus.       Right eye: No discharge.        Left eye: No discharge.     Conjunctiva/sclera: Conjunctivae normal.  Pulmonary:     Effort: Pulmonary effort is normal. No respiratory distress.  Abdominal:     General: Bowel sounds are decreased.     Tenderness: There is generalized abdominal tenderness. There is no guarding or rebound.     Comments: Firm  Skin:    General: Skin is warm and dry.  Neurological:     General: No focal deficit present.     Mental Status: She is alert.  Psychiatric:        Mood and Affect: Mood normal.        Behavior: Behavior normal.     MAU Course  Procedures Results for orders placed or performed during the hospital encounter of 12/28/23 (from the past 24 hours)  Urinalysis, Routine w reflex  microscopic -Urine, Clean Catch     Status: Abnormal   Collection Time: 12/28/23  1:05 PM  Result Value Ref Range   Color, Urine YELLOW YELLOW   APPearance CLEAR CLEAR   Specific Gravity, Urine 1.014 1.005 - 1.030   pH 6.0 5.0 - 8.0   Glucose, UA NEGATIVE NEGATIVE mg/dL   Hgb urine dipstick NEGATIVE NEGATIVE   Bilirubin Urine NEGATIVE NEGATIVE   Ketones, ur NEGATIVE NEGATIVE mg/dL   Protein, ur NEGATIVE NEGATIVE mg/dL   Nitrite NEGATIVE NEGATIVE   Leukocytes,Ua TRACE (A) NEGATIVE   RBC / HPF 0-5 0 - 5 RBC/hpf   WBC, UA 0-5 0 - 5 WBC/hpf   Bacteria, UA NONE SEEN NONE SEEN   Squamous Epithelial / HPF 0-5 0 - 5 /HPF   Mucus PRESENT   CBC with Differential/Platelet     Status: None   Collection Time: 12/28/23  1:13 PM  Result Value Ref Range   WBC 7.0 4.0 - 10.5 K/uL   RBC 4.36 3.87 - 5.11 MIL/uL   Hemoglobin 13.1 12.0 - 15.0 g/dL   HCT 78.2 95.6 - 21.3 %   MCV 88.1 80.0 - 100.0 fL   MCH 30.0 26.0 - 34.0 pg   MCHC 34.1 30.0 - 36.0 g/dL   RDW 08.6 57.8 - 46.9 %   Platelets 222 150 - 400 K/uL   nRBC 0.0 0.0 - 0.2 %   Neutrophils Relative % 76 %   Neutro Abs 5.3 1.7 - 7.7 K/uL   Lymphocytes Relative 15 %   Lymphs Abs 1.1 0.7 - 4.0 K/uL   Monocytes Relative 6 %   Monocytes Absolute 0.4 0.1 - 1.0 K/uL   Eosinophils Relative 3 %   Eosinophils Absolute 0.2 0.0 - 0.5 K/uL   Basophils Relative 0 %   Basophils Absolute 0.0 0.0 - 0.1 K/uL   Immature Granulocytes 0 %   Abs Immature Granulocytes 0.01 0.00 - 0.07 K/uL  Comprehensive metabolic panel     Status: Abnormal   Collection Time: 12/28/23  1:13 PM  Result Value Ref Range   Sodium 139 135 - 145 mmol/L   Potassium 4.3 3.5 - 5.1 mmol/L   Chloride 108 98 - 111 mmol/L   CO2 23 22 - 32 mmol/L   Glucose, Bld 94 70 - 99 mg/dL   BUN 8 6 - 20 mg/dL   Creatinine, Ser 6.29 0.44 -  1.00 mg/dL   Calcium 8.8 (L) 8.9 - 10.3 mg/dL   Total Protein 6.1 (L) 6.5 - 8.1 g/dL   Albumin 2.8 (L) 3.5 - 5.0 g/dL   AST 15 15 - 41 U/L   ALT 16 0 -  44 U/L   Alkaline Phosphatase 47 38 - 126 U/L   Total Bilirubin 0.7 0.0 - 1.2 mg/dL   GFR, Estimated >29 >56 mL/min   Anion gap 8 5 - 15   US PELVIS (TRANSABDOMINAL ONLY) Result Date: 12/28/2023 CLINICAL DATA:  [id number] Postpartum pain [id number]. EXAM: TRANSABDOMINAL ULTRASOUND OF PELVIS TECHNIQUE: Transabdominal ultrasound examination of the pelvis was performed including evaluation of the uterus, ovaries, adnexal regions, and pelvic cul-de-sac. COMPARISON:  None Available. FINDINGS: Uterus Measurements: 7.7 x 9.7 x 14.9 cm = volume: 581.9 mL. Normal appearance of postpartum uterus. No focal mass. Endometrium Thickness: Up to 9 mm. No focal mass. There is trace amount of fluid within the endometrial canal, within normal limits for postpartum state. Right ovary Measurements: 0.9 x 2.0 x 2.1 Cm = volume: 2.0 mL. Normal appearance/no adnexal mass. Left ovary Measurements: 2.2 x 2.4 x 3.6 cm = volume: 9.9 mL. Normal appearance/no adnexal mass. Other findings:  No abnormal free fluid. IMPRESSION: *Unremarkable postpartum pelvic ultrasound examination. Electronically Signed   By: Jules Schick M.D.   On: 12/28/2023 15:37    MDM Assessment and Plan   1. Other constipation    -Labs & imaging not concerning for endometritis or retained POCs -Additional abdominal exam performed by Dr. Earlene Plater. Symptoms & exam likely due to constipation. Given Smog enema with good results. Patient reports improvement in pain after enema -Remains stable, afebrile, normotensive. Will discharge home. Reviewed constipation management at home & reasons to return to MAU  Judeth Horn 12/28/2023, 8:01 PM

## 2023-12-28 NOTE — MAU Note (Signed)
.  Maria Morgan is a 33 y.o. at Unknown here in MAU reporting: 10 days post partum vaginal delivery. She reports hot flashes, dizziness, abdominal pain, and back pain. She reports her sx's began after her vaginal delivery and they have not stopped. She reports her back pain is in the middle of her back and shoots out bilaterally into her hips. She reports she also feels as if she is having contractions. She reports yesterday her bleeding had increased and she passed several large clots. She reports she told her OB yesterday and they informed her to decreased her activity. She reports while standing doing dishes yesterday she began to feel dizzy and was experiencing hot flashes. She reports this happened once this morning as well. Breastfeeding.  Last doses: Tylenol 1000 mg around 0830  Has eaten half of a sandwich today. When asked about daily eating since delivery she reports "I mean I don't eat a lot. I eat snacks and stuff." She reports cookies, apples, and smoothies. She reports her first full meal post partum was yesterday. Reports four water bottles a day.  Onset of complaint: Ongoing since delivery  Pain scores:  8/10 abdomen 8/10 back  Vitals:   12/28/23 1234  BP: 127/84  Pulse: 92  Resp: 18  Temp: 98.8 F (37.1 C)  SpO2: 100%      Lab orders placed from triage: UA

## 2023-12-28 NOTE — Discharge Instructions (Signed)
 You have constipation which is hard stools that are difficult to pass. It is important to have regular bowel movements every 1-3 days that are soft and easy to pass. Hard stools increase your risk of hemorrhoids and are very uncomfortable.   To prevent constipation you can increase the amount of fiber in your diet. Examples of foods with fiber are leafy greens, whole grain breads, oatmeal and other grains.  It is also important to drink at least 64 ounces of water everyday.   If you have not had a bowel movement in 4-5 days, you made need to clean out your bowel.  This will help you establish normal movements through your bowel.    Miralax Clean out Take 8 capfuls of miralax in 64 oz of gatorade. You can use any fluid that appeals to you (gatorade, water, juice) Continue to drink at least 64 ounces of water throughout the day You can repeat with another 8 capfuls of miralax in 64 oz of gatorade if you are not having a large amount of stools You will need to be at home and close to a bathroom for about 8 hours when you do the above as you may need to go to the bathroom frequently.   After you are cleaned out: - Start Colace100mg  twice daily - Start Miralax once daily - Start a daily fiber supplement like metamucil or citrucel - You can safely use enemas in pregnancy  - if you are having diarrhea you can reduce to Colace once a day or miralax every other day or a 1/2 capful daily.

## 2024-01-26 ENCOUNTER — Ambulatory Visit: Admitting: Obstetrics and Gynecology

## 2024-01-26 ENCOUNTER — Encounter: Payer: Self-pay | Admitting: Obstetrics and Gynecology

## 2024-01-26 NOTE — Progress Notes (Signed)
 Post Partum Visit Note  Maria Morgan is a 33 y.o. G46P3003 female who presents for a postpartum visit. She is 5 weeks postpartum following a normal spontaneous vaginal delivery.  I have fully reviewed the prenatal and intrapartum course.   The delivery was at 40 gestational weeks.  Anesthesia: epidural. Postpartum course has been complicated by constipation. Baby is doing well. Baby is feeding by breast. Bleeding moderate lochia. Bowel function is occasional constipation. Bladder function is normal. Patient is not sexually active. Contraception method is none. Postpartum depression screening: negative.   The pregnancy intention screening data noted above was reviewed. Potential methods of contraception were discussed. The patient elected to proceed with No data recorded.   Edinburgh Postnatal Depression Scale - 01/26/24 1118       Edinburgh Postnatal Depression Scale:  In the Past 7 Days   I have been able to laugh and see the funny side of things. 0    I have looked forward with enjoyment to things. 1    I have blamed myself unnecessarily when things went wrong. 1    I have been anxious or worried for no good reason. 1    I have felt scared or panicky for no good reason. 0    Things have been getting on top of me. 1    I have been so unhappy that I have had difficulty sleeping. 1    I have felt sad or miserable. 0    I have been so unhappy that I have been crying. 0    The thought of harming myself has occurred to me. 0    Edinburgh Postnatal Depression Scale Total 5             Health Maintenance Due  Topic Date Due   COVID-19 Vaccine (1 - 2024-25 season) Never done    The following portions of the patient's history were reviewed and updated as appropriate: allergies, current medications, past family history, past medical history, past social history, past surgical history, and problem list.  Review of Systems Pertinent items are noted in HPI.  Objective:  BP  135/87 (BP Location: Left Arm, Patient Position: Sitting, Cuff Size: Large)   Pulse (!) 102   Wt (!) 303 lb (137.4 kg)   LMP 03/10/2023 (Approximate)   Breastfeeding Yes   BMI 47.46 kg/m    General:  alert and cooperative   Breasts:  not indicated  Lungs: CTAB  Heart:  Regular rate  Abdomen: Soft, nontender, nondistended, obese  Wound N/a  GU exam:  EGBUS within normal limits  Vagina normal, normal discharge Cervix closed without lesions Uterus normal, nontender Adnexa normal, nontender       Assessment:    1. Postpartum care and examination Doing well, routine postpartum care    Plan:   Essential components of care per ACOG recommendations:  1.  Mood and well being: Patient with negative depression screening today.     2. Infant care and feeding:  -Patient currently breastmilk feeding? Yes. Discussed returning to work and pumping. Reviewed importance of draining breast regularly to support lactation.  -Social determinants of health (SDOH) reviewed in EPIC. No concerns   3. Sexuality, contraception and birth spacing - Declines contraception today  4. Sleep and fatigue -Encouraged family/partner/community support of 4 hrs of uninterrupted sleep to help with mood and fatigue  5. Physical Recovery  - Discussed patients delivery and complications.   - Patient had a Vaginal, no problems at delivery. Patient  had a  no  laceration.   - Patient has urinary incontinence? No. - Patient is safe to resume physical and sexual activity  6.  Health Maintenance - Last pap smear  Diagnosis  Date Value Ref Range Status  06/03/2023   Final   - Negative for intraepithelial lesion or malignancy (NILM)   Pap smear not done at today's visit.     7. Chronic Disease/Pregnancy Condition follow up: None  - PCP follow up  Marci Setter, MD Center for [facility], [facility]

## 2024-02-19 ENCOUNTER — Other Ambulatory Visit (HOSPITAL_BASED_OUTPATIENT_CLINIC_OR_DEPARTMENT_OTHER): Payer: Self-pay

## 2024-02-19 MED ORDER — VALACYCLOVIR HCL 1 G PO TABS
1000.0000 mg | ORAL_TABLET | Freq: Every day | ORAL | 6 refills | Status: AC
  Filled 2024-02-19 – 2024-05-19 (×3): qty 5, 5d supply, fill #0
  Filled 2024-06-18 – 2024-08-17 (×2): qty 5, 5d supply, fill #1
  Filled 2024-08-28: qty 5, 5d supply, fill #2
  Filled 2024-09-06 – 2024-10-11 (×2): qty 5, 5d supply, fill #3

## 2024-02-19 NOTE — Addendum Note (Signed)
 Addended by: Malka Sea on: 02/19/2024 05:26 PM   Modules accepted: Orders

## 2024-03-04 ENCOUNTER — Other Ambulatory Visit (HOSPITAL_BASED_OUTPATIENT_CLINIC_OR_DEPARTMENT_OTHER): Payer: Self-pay

## 2024-03-09 ENCOUNTER — Emergency Department
Admission: EM | Admit: 2024-03-09 | Discharge: 2024-03-09 | Disposition: A | Discharge status: Home / Self Care | Payer: Worker's Compensation | Attending: Emergency Medicine | Admitting: Emergency Medicine

## 2024-03-09 ENCOUNTER — Other Ambulatory Visit: Payer: Self-pay

## 2024-03-09 ENCOUNTER — Emergency Department: Payer: Worker's Compensation

## 2024-03-09 DIAGNOSIS — R55 Syncope and collapse: Secondary | ICD-10-CM | POA: Insufficient documentation

## 2024-03-09 LAB — COMPREHENSIVE METABOLIC PANEL WITH GFR
ALT: 22 U/L (ref 0–44)
AST: 24 U/L (ref 15–41)
Albumin: 3.6 g/dL (ref 3.5–5.0)
Alkaline Phosphatase: 41 U/L (ref 38–126)
Anion gap: 8 (ref 5–15)
BUN: 10 mg/dL (ref 6–20)
CO2: 21 mmol/L — ABNORMAL LOW (ref 22–32)
Calcium: 8.5 mg/dL — ABNORMAL LOW (ref 8.9–10.3)
Chloride: 108 mmol/L (ref 98–111)
Creatinine, Ser: 0.73 mg/dL (ref 0.44–1.00)
GFR, Estimated: >60 mL/min (ref >60)
Glucose, Bld: 87 mg/dL (ref 70–99)
Potassium: 3.8 mmol/L (ref 3.5–5.1)
Sodium: 137 mmol/L (ref 135–145)
Total Bilirubin: 0.9 mg/dL (ref 0.0–1.2)
Total Protein: 6.8 g/dL (ref 6.5–8.1)

## 2024-03-09 LAB — CBC
HCT: 37.9 % (ref 36.0–46.0)
Hemoglobin: 13.2 g/dL (ref 12.0–15.0)
MCH: 30.4 pg (ref 26.0–34.0)
MCHC: 34.8 g/dL (ref 30.0–36.0)
MCV: 87.3 fL (ref 80.0–100.0)
Platelets: 206 10*3/uL (ref 150–400)
RBC: 4.34 MIL/uL (ref 3.87–5.11)
RDW: 11.9 % (ref 11.5–15.5)
WBC: 4.1 10*3/uL (ref 4.0–10.5)
nRBC: 0 % (ref 0.0–0.2)

## 2024-03-09 LAB — URINALYSIS, ROUTINE W REFLEX MICROSCOPIC
Bilirubin Urine: NEGATIVE
Glucose, UA: NEGATIVE mg/dL
Hgb urine dipstick: NEGATIVE
Ketones, ur: NEGATIVE mg/dL
Leukocytes,Ua: NEGATIVE
Nitrite: NEGATIVE
Protein, ur: NEGATIVE mg/dL
Specific Gravity, Urine: 1.011 (ref 1.005–1.030)
pH: 5 (ref 5.0–8.0)

## 2024-03-09 LAB — POC URINE PREG, ED: Preg Test, Ur: NEGATIVE

## 2024-03-09 MED ORDER — SODIUM CHLORIDE 0.9 % IV BOLUS: 1000.0000 mL | Freq: Once | INTRAVENOUS | Status: DC

## 2024-03-09 NOTE — ED Notes (Signed)
 Pt taken to ct and they will bring to room after

## 2024-03-09 NOTE — ED Notes (Signed)
 Pt given oral fluids and crackers instead of IVF per PA Poggie

## 2024-03-09 NOTE — ED Notes (Signed)
 Pt is CAOx4, breathing normally, and normal in color. Pt is complaining of passing out while at work today. Pt states that she is unsure why this happened but says that she has not eaten or had anything to drink all day due to being busy at work. Pt also states being hot at work. Pt lying in bed at this time and in NAD. Fall risk bundle initiated with arm band and bed alarm. Pt wearing shoes.

## 2024-03-09 NOTE — ED Notes (Signed)
 Attempted an IV x2 without success. IV team consult made.

## 2024-03-09 NOTE — ED Provider Notes (Signed)
 [facility] Provider Note    Event Date/Time   First MD Initiated Contact with Patient 03/09/24 1429     (approximate)   History   Loss of Consciousness   HPI  Maria Morgan is a 33 y.o. female who presents today for evaluation after syncopal episode.  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  She reports that she had not eaten anything this morning because she was rushing to go to work and had to breast-feed her 39-week-old at home.  She reports that she was trying to open a door on the trailer to get more airflow when she felt very lightheaded and had a syncopal episode.  This was witnessed.  There was no seizure-like activity.  She did not have any loss of urine or tongue biting.  She reports that she has syncopized multiple times in the past under similar conditions.  She denies chest pain or shortness of breath.  She has not had any calf or leg swelling.  She has no history of PE or DVT.  She had no headache prior to the event.  She reports that she is feeling better.  Patient Active Problem List   Diagnosis Date Noted   SVD (spontaneous vaginal delivery) 12/17/2023   Oligohydramnios 12/17/2023   Supervision of high risk pregnancy, antepartum, third trimester 05/09/2023   Leukopenia 03/05/2022   Maternal morbid obesity, antepartum (HCC) 02/11/2022   Pituitary adenoma (HCC) 10/10/2016   Vitamin D  deficiency 01/15/2016   ADHD (attention deficit hyperactivity disorder) 10/14/2015          Physical Exam   Triage Vital Signs: ED Triage Vitals  Encounter Vitals Group     BP 03/09/24 1338 (!) 131/90     Systolic BP Percentile --      Diastolic BP Percentile --      Pulse Rate 03/09/24 1338 75     Resp 03/09/24 1338 18     Temp 03/09/24 1338 98 F (36.7 C)     Temp src --      SpO2 03/09/24 1338 98 %     Weight 03/09/24 1335 287 lb (130.2 kg)     Height 03/09/24 1335 5\' 8"  (1.727 m)     Head  Circumference --      Peak Flow --      Pain Score 03/09/24 1335 7     Pain Loc --      Pain Education --      Exclude from Growth Chart --     Most recent vital signs: Vitals:   03/09/24 1338 03/09/24 1744  BP: (!) 131/90 (!) 142/90  Pulse: 75 70  Resp: 18 18  Temp: 98 F (36.7 C)   SpO2: 98% 100%    Physical Exam Vitals and nursing note reviewed.  Constitutional:      General: Awake and alert. No acute distress.    Appearance: Normal appearance. The patient is normal weight.  HENT:     Head: Normocephalic and atraumatic.     Mouth: Mucous membranes are moist.  Eyes:     General: PERRL. Normal EOMs        Right eye: No discharge.        Left eye: No discharge.     Conjunctiva/sclera: Conjunctivae normal.  Cardiovascular:     Rate and Rhythm: Normal rate and regular rhythm.     Pulses: Normal pulses.  Pulmonary:  Effort: Pulmonary effort is normal. No respiratory distress.     Breath sounds: Normal breath sounds.  Abdominal:     Abdomen is soft. There is no abdominal tenderness. No rebound or guarding. No distention. Musculoskeletal:        General: No swelling. Normal range of motion.     Cervical back: Normal range of motion and neck supple.  Skin:    General: Skin is warm and dry.     Capillary Refill: Capillary refill takes less than 2 seconds.     Findings: No rash.  Neurological:     Mental Status: The patient is awake and alert.      ED Results / Procedures / Treatments   Labs (all labs ordered are listed, but only abnormal results are displayed) Labs Reviewed  COMPREHENSIVE METABOLIC PANEL WITH GFR - Abnormal; Notable for the following components:      Result Value   CO2 21 (*)    Calcium 8.5 (*)    All other components within normal limits  URINALYSIS, ROUTINE W REFLEX MICROSCOPIC - Abnormal; Notable for the following components:   Color, Urine YELLOW (*)    APPearance CLEAR (*)    All other components within normal limits  CBC  CBG  MONITORING, ED  POC URINE PREG, ED     EKG     RADIOLOGY I independently reviewed and interpreted imaging and agree with radiologists findings.     PROCEDURES:  Critical Care performed:   Procedures   MEDICATIONS ORDERED IN ED: Medications - No data to display   IMPRESSION / MDM / ASSESSMENT AND PLAN / ED COURSE  I reviewed the triage vital signs and the nursing notes.   Differential diagnosis includes, but is not limited to, vasovagal syndrome, dehydration, electrolyte disarray, arrhythmia, less likely intracranial hemorrhage or ectopic pregnancy.  Patient is awake and alert, hemodynamically stable and afebrile.  She is nontoxic in appearance.  EKG is nonischemic, no arrhythmia noted.  Also reviewed by attending MD.  Labs are overall reassuring.  Normal H&H, normal electrolytes.  There was no headache prior to the event and I have a low suspicion for intracranial hemorrhage, though CT scan was ordered by triage and this was negative as well.  Patient was monitored on the monitor in the emergency department for over 4 hours with complete resolution of symptoms.  She is able to eat and drink and walk without difficulty.  This has happened to her several times before.  We discussed return precautions and outpatient follow-up.  Patient understands and agrees with plan.  She was discharged in stable condition.   Patient's presentation is most consistent with acute complicated illness / injury requiring diagnostic workup.   Clinical Course as of 03/09/24 1756  Tue Mar 09, 2024  1720 Patient reports feeling significantly improved [JP]    Clinical Course User Index [JP] Lakina Mcintire E, PA-C     FINAL CLINICAL IMPRESSION(S) / ED DIAGNOSES   Final diagnoses:  Syncope and collapse     Rx / DC Orders   ED Discharge Orders     None        Note:  This document was prepared using Dragon voice recognition software and may include unintentional dictation errors.    Kemora Pinard E, PA-C 03/09/24 1756    Arline Bennett, MD 03/09/24 [phone]

## 2024-03-09 NOTE — ED Notes (Signed)
 Pt roomed at this time by CT staff.

## 2024-03-09 NOTE — ED Triage Notes (Signed)
 Pt comes via EMS from work with c/o loc. Pt has multiple episodes. Pt was at work and works in [employer] work. Pt got hot from no AC and hasn't ate. Pt got dizzy and passed out. Pt did hit her head.  CBG 108

## 2024-03-09 NOTE — Discharge Instructions (Signed)
Please follow-up with your outpatient provider.  Please return for any new, worsening, or change in symptoms or other concerns.  It was a pleasure caring for you today. 

## 2024-03-23 ENCOUNTER — Ambulatory Visit: Payer: Self-pay

## 2024-03-23 NOTE — Telephone Encounter (Signed)
 Copied from CRM [id number]. Topic: Clinical - Red Word Triage >> Mar 23, 2024 10:07 AM [place] J wrote: [facility] that prompted transfer to Nurse Triage: Patient passed out and hit her head on the 27th, but she still feels like her equilibrium is off. She stated that she filed workman's comp and now needs to establish to go through with claim.   Reason for Disposition  [1] Prolonged standing caused simple fainting AND [2] now alert and feels fine    Patient seen in ED when syncopal event occurred, new patient appointment scheduled in 2 days  Answer Assessment - Initial Assessment Questions 1. ONSET: "How long were you unconscious?" (minutes) "When did it happen?"     03/09/24 2. CONTENT: "What happened during period of unconsciousness?" (e.g., seizure activity)      Unsure  3. MENTAL STATUS: "Alert and oriented now?" (oriented x 3 = name, month, location)      Yes 4. TRIGGER: "What do you think caused the fainting?" "What were you doing just before you fainted?"  (e.g., exercise, sudden standing up, prolonged standing)     Unsure 5. RECURRENT SYMPTOM: "Have you ever passed out before?" If Yes, ask: "When was the last time?" and "What happened that time?"      No 6. INJURY: "Did you sustain any injury during the fall?"      Hit head  7. CARDIAC SYMPTOMS: "Have you had any of the following symptoms: chest pain, difficulty breathing, palpitations?"     No 8. NEUROLOGIC SYMPTOMS: "Have you had any of the following symptoms: headache, numbness, vertigo, weakness?"     Headaches, off balance  9. GI SYMPTOMS: "Have you had any of the following symptoms: abdomen pain, vomiting, diarrhea, blood in stools?"     No 10. OTHER SYMPTOMS: "Do you have any other symptoms?"       Neck pain  11. PREGNANCY: "Is there any chance you are pregnant?" "When was your last menstrual period?"       No  Protocols used: Fainting-A-AH   FYI Only or Action Required?: FYI only for provider  Patient was last seen in  primary care on N/A. Called Nurse Triage reporting Loss of Consciousness. Symptoms began 03/09/24. Interventions attempted: Other: Seen in ED at the time. Symptoms are: continued headaches.  Triage Disposition: Home Care (New patient appointment scheduled)  Patient/caregiver understands and will follow disposition?:

## 2024-03-25 ENCOUNTER — Ambulatory Visit: Admitting: Nurse Practitioner

## 2024-03-25 ENCOUNTER — Other Ambulatory Visit (HOSPITAL_BASED_OUTPATIENT_CLINIC_OR_DEPARTMENT_OTHER): Payer: Self-pay

## 2024-03-25 ENCOUNTER — Encounter: Payer: Self-pay | Admitting: Nurse Practitioner

## 2024-03-25 VITALS — BP 138/78 | HR 91 | Temp 97.1°F | Ht 68.0 in | Wt 323.4 lb

## 2024-03-25 DIAGNOSIS — R55 Syncope and collapse: Secondary | ICD-10-CM

## 2024-03-25 DIAGNOSIS — E559 Vitamin D deficiency, unspecified: Secondary | ICD-10-CM

## 2024-03-25 DIAGNOSIS — N6321 Unspecified lump in the left breast, upper outer quadrant: Secondary | ICD-10-CM | POA: Diagnosis not present

## 2024-03-25 DIAGNOSIS — S161XXD Strain of muscle, fascia and tendon at neck level, subsequent encounter: Secondary | ICD-10-CM | POA: Diagnosis not present

## 2024-03-25 DIAGNOSIS — S161XXA Strain of muscle, fascia and tendon at neck level, initial encounter: Secondary | ICD-10-CM | POA: Insufficient documentation

## 2024-03-25 MED ORDER — CYCLOBENZAPRINE HCL 5 MG PO TABS
5.0000 mg | ORAL_TABLET | Freq: Three times a day (TID) | ORAL | 1 refills | Status: AC | PRN
  Filled 2024-03-25 – 2024-08-17 (×2): qty 30, 10d supply, fill #0

## 2024-03-25 NOTE — Assessment & Plan Note (Signed)
 Continue multivitamin daily. Check labs next visit.

## 2024-03-25 NOTE — Assessment & Plan Note (Signed)
 A breast lump is noted five weeks postpartum, with a family history of aggressive breast cancer. May be related to breast feeding, however with family history of breast cancer, will evaluate further. Order a mammogram and inform radiology of her breastfeeding status.

## 2024-03-25 NOTE — Assessment & Plan Note (Signed)
 Syncope is attributed to dehydration and heat exposure from prolonged work hours, with lightheadedness and leg swelling observed. CMP, CBC, and CT head/neck reviewed from ER and were normal. Breastfeeding increases her hydration and caloric needs. Advise using compression socks for circulation and swelling. Encourage hydration and regular meals, especially with breastfeeding. Discuss work accommodations with HR to ensure adequate breaks and avoid heat exposure. Schedule a follow-up physical in three months or sooner if issues arise.

## 2024-03-25 NOTE — Progress Notes (Signed)
 New Patient Visit  BP 138/78 (BP Location: Left Arm, Cuff Size: Large)   Pulse 91   Temp (!) 97.1 F (36.2 C)   Ht 5' 8 (1.727 m)   Wt (!) 323 lb 6.4 oz (146.7 kg)   SpO2 98%   Breastfeeding Yes   BMI 49.17 kg/m    Subjective:    Patient ID: Maria Morgan, female    DOB: [birth year], 33 y.o.   MRN: [id number]  CC: Chief Complaint  Patient presents with   Establish Care    NP. Est. Care, concerns with syncope issue, headaches, neck and shoulder pain    HPI: Maria Morgan is a 33 y.o. female presents for new patient visit to establish care.  Introduced to Publishing rights manager role and practice setting.  All questions answered.  Discussed provider/patient relationship and expectations.  Discussed the use of AI scribe software for clinical note transcription with the patient, who gave verbal consent to proceed.  History of Present Illness   Maria Morgan is a 33 year old female who presents to establish care with episodes of syncope and fatigue following a recent return to work postpartum. She is accompanied by her 38-week-old son, Maria Morgan.  She experiences syncope and fatigue since returning to work postpartum. On May 27th, she fainted at work due to lack of air conditioning and inadequate breaks, resulting in a syncopal episode x2  and hitting her head. EMS transported her to the hospital, where her blood pressure was noted to be abnormal. She had a CT of her head and neck which was negative. She continues to feel lightheaded and fatigued, especially during long work hours without sufficient breaks.  She has shoulder pain since the syncope incident, particularly going up to her neck, likely due to the fall. She uses Tylenol  and heat for relief. There is no chest pain, shortness of breath, abdominal pain, constipation, diarrhea, or urinary issues, but she notes some leg swelling and fatigue.  She is breastfeeding and faces challenges with hydration and nutrition due to her work  schedule. She drinks about four to five 20-ounce bottles of water daily but acknowledges it may not be sufficient. She seeks better work accommodations to manage her health and breastfeeding needs.     Depression and Anxiety Screen done:     03/25/2024   10:13 AM 06/03/2023    9:30 AM 08/16/2022   11:21 AM 03/05/2022    9:12 AM  Depression screen PHQ 2/9  Decreased Interest 0 1 0 0  Down, Depressed, Hopeless 0 1 0 0  PHQ - 2 Score 0 2 0 0  Altered sleeping 2 1    Tired, decreased energy 2 2    Change in appetite 2 1    Feeling bad or failure about yourself  0 0    Trouble concentrating 1 1    Moving slowly or fidgety/restless 1     Suicidal thoughts 0 1    PHQ-9 Score 8 8    Difficult doing work/chores Somewhat difficult         03/25/2024   10:13 AM 06/03/2023    9:30 AM  GAD 7 : Generalized Anxiety Score  Nervous, Anxious, on Edge 1 1  Control/stop worrying 0 1  Worry too much - different things 2 1  Trouble relaxing 2 0  Restless 1 0  Easily annoyed or irritable 2 2  Afraid - awful might happen 0 0  Total GAD 7 Score 8 5  Anxiety Difficulty Very  difficult     Past Medical History:  Diagnosis Date   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   Benign neoplasm of colon 02/20/2012   Brain tumor (benign) (HCC)    Chlamydia    Gonorrhea    Hyperprolactinemia (HCC) 09/26/2015   Malaria    Obesity, Class III, BMI 40-49.9 (morbid obesity) 02/11/2022    Past Surgical History:  Procedure Laterality Date   NO PAST SURGERIES      Family History  Problem Relation Age of Onset   Cancer Mother        colon   Diabetes Paternal Grandfather    Diabetes Other      Social History   Tobacco Use   Smoking status: Never   Smokeless tobacco: Never  Vaping Use   Vaping status: Never Used  Substance Use Topics   Alcohol use: Not Currently   Drug use: No    Current Outpatient Medications on File Prior to Visit  Medication Sig Dispense Refill   Prenatal Vit-Fe Fumarate-FA  (PRENATAL VITAMIN) 27-0.8 MG TABS Take 1 capsule by mouth daily. 30 tablet 0   senna-docusate (SENOKOT-S) 8.6-50 MG tablet Take 2 tablets by mouth at bedtime as needed for mild constipation or moderate constipation. 60 tablet 1   valACYclovir  (VALTREX ) 1000 MG tablet Take 1 tablet (1,000 mg total) by mouth daily for 5 days. 5 tablet 6   acetaminophen  (TYLENOL ) 500 MG tablet Take 1,000 mg by mouth every 6 (six) hours as needed. (Patient not taking: Reported on 03/25/2024)     furosemide  (LASIX ) 20 MG tablet Take 1 tablet (20 mg total) by mouth daily for 5 days. (Patient not taking: Reported on 03/25/2024) 7 tablet 0   ibuprofen  (ADVIL ) 600 MG tablet Take 1 tablet (600 mg total) by mouth every 6 (six) hours as needed for mild pain (pain score 1-3) or moderate pain (pain score 4-6). (Patient not taking: Reported on 03/25/2024) 60 tablet 1   No current facility-administered medications on file prior to visit.     Review of Systems  Constitutional:  Positive for fatigue. Negative for fever.  HENT: Negative.    Eyes: Negative.   Respiratory: Negative.    Cardiovascular:  Positive for leg swelling. Negative for chest pain.  Gastrointestinal: Negative.   Endocrine: Negative.   Genitourinary: Negative.   Musculoskeletal: Negative.   Skin: Negative.   Neurological:  Positive for dizziness.  Psychiatric/Behavioral: Negative.          Objective:    BP 138/78 (BP Location: Left Arm, Cuff Size: Large)   Pulse 91   Temp (!) 97.1 F (36.2 C)   Ht 5' 8 (1.727 m)   Wt (!) 323 lb 6.4 oz (146.7 kg)   SpO2 98%   Breastfeeding Yes   BMI 49.17 kg/m   Wt Readings from Last 3 Encounters:  03/25/24 (!) 323 lb 6.4 oz (146.7 kg)  03/09/24 287 lb (130.2 kg)  01/26/24 (!) 303 lb (137.4 kg)    BP Readings from Last 3 Encounters:  03/25/24 138/78  03/09/24 (!) 142/90  01/26/24 135/87    Physical Exam Vitals and nursing note reviewed.  Constitutional:      General: She is not in acute distress.     Appearance: Normal appearance.  HENT:     Head: Normocephalic and atraumatic.     Right Ear: Tympanic membrane, ear canal and external ear normal.     Left Ear: Tympanic membrane, ear canal and external ear normal.   Eyes:  Conjunctiva/sclera: Conjunctivae normal.    Cardiovascular:     Rate and Rhythm: Normal rate and regular rhythm.     Pulses: Normal pulses.     Heart sounds: Normal heart sounds.  Pulmonary:     Effort: Pulmonary effort is normal.     Breath sounds: Normal breath sounds.  Abdominal:     Palpations: Abdomen is soft.     Tenderness: There is no abdominal tenderness.   Musculoskeletal:        General: Normal range of motion.     Cervical back: Normal range of motion and neck supple.     Right lower leg: No edema.     Left lower leg: No edema.  Lymphadenopathy:     Cervical: No cervical adenopathy.   Skin:    General: Skin is warm and dry.   Neurological:     General: No focal deficit present.     Mental Status: She is alert and oriented to person, place, and time.     Cranial Nerves: No cranial nerve deficit.     Coordination: Coordination normal.     Gait: Gait normal.   Psychiatric:        Mood and Affect: Mood normal.        Behavior: Behavior normal.        Thought Content: Thought content normal.        Judgment: Judgment normal.        Assessment & Plan:   Problem List Items Addressed This Visit       Musculoskeletal and Integument   Cervical strain - Primary   She experiences shoulder and neck pain post-syncope, likely from a fall. The pain is shooting and worsens with movement. Tylenol  and heat provide relief. Start Flexeril  10mg  up to three times daily as needed, preferably at bedtime as it can cause fatigue. Refer to physical therapy for evaluation and management.      Relevant Orders   Ambulatory referral to Physical Therapy     Other   Vitamin D  deficiency   Continue multivitamin daily. Check labs next visit.       Mass  of upper outer quadrant of left breast   A breast lump is noted five weeks postpartum, with a family history of aggressive breast cancer. May be related to breast feeding, however with family history of breast cancer, will evaluate further. Order a mammogram and inform radiology of her breastfeeding status.       Relevant Orders   MM 3D DIAGNOSTIC MAMMOGRAM BILATERAL BREAST   Morbid obesity (HCC)   BMI 49.1. She is interested in establishing with a weight loss clinic. Referral placed to [facility].       Relevant Orders   Amb Ref to Medical Weight Management   Syncope   Syncope is attributed to dehydration and heat exposure from prolonged work hours, with lightheadedness and leg swelling observed. CMP, CBC, and CT head/neck reviewed from ER and were normal. Breastfeeding increases her hydration and caloric needs. Advise using compression socks for circulation and swelling. Encourage hydration and regular meals, especially with breastfeeding. Discuss work accommodations with HR to ensure adequate breaks and avoid heat exposure. Schedule a follow-up physical in three months or sooner if issues arise.        Follow up plan: Return in about 3 months (around 06/25/2024) for CPE.  Osias Resnick A Zaynab Chipman

## 2024-03-25 NOTE — Patient Instructions (Addendum)
 It was great to see you!  Make sure you are drinking plenty of fluids and eat regularly throughout the day  Wear compression socks during the day, especially while at work   I have placed a referral to weight management   I have placed a referral to physical therapy  Start flexeril  3 times a day as needed for muscle tightness/spasm - this may make you sleepy   Let's follow-up in 3 months, sooner if you have concerns.  If a referral was placed today, you will be contacted for an appointment. Please note that routine referrals can sometimes take up to 3-4 weeks to process. Please call our office if you haven't heard anything after this time frame.  Take care,  Rheba Cedar, NP

## 2024-03-25 NOTE — Assessment & Plan Note (Signed)
 BMI 49.1. She is interested in establishing with a weight loss clinic. Referral placed to [facility].

## 2024-03-25 NOTE — Assessment & Plan Note (Signed)
 She experiences shoulder and neck pain post-syncope, likely from a fall. The pain is shooting and worsens with movement. Tylenol  and heat provide relief. Start Flexeril  10mg  up to three times daily as needed, preferably at bedtime as it can cause fatigue. Refer to physical therapy for evaluation and management.

## 2024-03-26 ENCOUNTER — Other Ambulatory Visit: Payer: Self-pay

## 2024-03-26 ENCOUNTER — Other Ambulatory Visit (HOSPITAL_COMMUNITY): Payer: Self-pay

## 2024-03-31 ENCOUNTER — Other Ambulatory Visit: Payer: Self-pay

## 2024-03-31 ENCOUNTER — Other Ambulatory Visit: Payer: Self-pay | Admitting: Nurse Practitioner

## 2024-03-31 DIAGNOSIS — N6321 Unspecified lump in the left breast, upper outer quadrant: Secondary | ICD-10-CM

## 2024-04-06 ENCOUNTER — Encounter

## 2024-04-06 ENCOUNTER — Other Ambulatory Visit (HOSPITAL_BASED_OUTPATIENT_CLINIC_OR_DEPARTMENT_OTHER): Payer: Self-pay

## 2024-04-06 ENCOUNTER — Other Ambulatory Visit

## 2024-04-09 ENCOUNTER — Encounter: Payer: Self-pay | Admitting: Nurse Practitioner

## 2024-04-21 ENCOUNTER — Encounter: Payer: Self-pay | Admitting: Nurse Practitioner

## 2024-04-21 ENCOUNTER — Ambulatory Visit (INDEPENDENT_AMBULATORY_CARE_PROVIDER_SITE_OTHER): Admitting: Nurse Practitioner

## 2024-04-21 VITALS — BP 130/80 | HR 88 | Temp 98.2°F | Ht 68.0 in | Wt 318.0 lb

## 2024-04-21 DIAGNOSIS — E559 Vitamin D deficiency, unspecified: Secondary | ICD-10-CM

## 2024-04-21 DIAGNOSIS — Z6841 Body Mass Index (BMI) 40.0 and over, adult: Secondary | ICD-10-CM | POA: Diagnosis not present

## 2024-04-21 DIAGNOSIS — E66813 Obesity, class 3: Secondary | ICD-10-CM | POA: Diagnosis not present

## 2024-04-21 NOTE — Progress Notes (Signed)
 Office: [phone]  /  Fax: [phone]   Initial Visit  Maria Morgan was seen in clinic today to evaluate for obesity. She is interested in losing weight to improve overall health and reduce the risk of weight related complications. She presents today to review program treatment options, initial physical assessment, and evaluation.     She was referred by: PCP  When asked what else they would like to accomplish? She states: Adopt a healthier eating pattern and lifestyle, Improve energy levels and physical activity, Improve existing medical conditions, and Improve quality of life  She had a baby 12/18/23 and is currently breast feeding  When asked how has your weight affected you? She states: Contributed to orthopedic problems or mobility issues, Having fatigue, and Having poor endurance  Some associated conditions: pituitary adenoma, ADHD, vit D def  Contributing factors: family history of obesity, moderate to high levels of stress, reduced physical activity, and hectic pace of life  Weight promoting medications identified: None  Current nutrition plan: None  Current level of physical activity: None  Current or previous pharmacotherapy: HCG, Vit B12  Response to medication: Lost weight initially but was unable to sustain weight loss   Past medical history includes:   Past Medical History:  Diagnosis Date   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  Peak Weight:323 lbs , Body Fat%:51.7%, Visceral Fat Rating:16, Weight trend  over the last 12 months: Increasing  General:  Alert, oriented and cooperative. Patient is in no acute distress.  Respiratory: Normal respiratory effort, no problems with respiration noted   Gait: able to ambulate independently  Mental Status: Normal mood and affect. Normal behavior. Normal judgment and thought content.   DIAGNOSTIC DATA REVIEWED:  BMET    Component Value Date/Time   NA 137 03/09/2024 1335   NA 138 07/14/2023 1105   K 3.8 03/09/2024 1335   CL 108 03/09/2024 1335   CO2 21 (L) 03/09/2024 1335   GLUCOSE 87 03/09/2024 1335   BUN 10 03/09/2024 1335   BUN 5 (L) 07/14/2023 1105   CREATININE 0.73 03/09/2024 1335   CALCIUM 8.5 (L) 03/09/2024 1335   GFRNONAA >60 03/09/2024 1335   GFRAA >60 01/11/2020 1436   Lab Results  Component Value Date   HGBA1C 5.5 06/03/2023   HGBA1C 5.3 10/10/2016   No results found for: INSULIN CBC    Component Value Date/Time   WBC 4.1 03/09/2024 1335   RBC 4.34 03/09/2024 1335   HGB 13.2 03/09/2024 1335   HGB 12.9 10/10/2023 0947   HCT 37.9 03/09/2024 1335   HCT 38.8 10/10/2023 0947   PLT 206 03/09/2024 1335   PLT 174 10/10/2023 0947   MCV 87.3 03/09/2024 1335   MCV 90 10/10/2023 0947   MCH 30.4 03/09/2024 1335   MCHC 34.8 03/09/2024 1335   RDW 11.9 03/09/2024 1335   RDW 12.1 10/10/2023 0947   Iron/TIBC/Ferritin/ %Sat  Component Value Date/Time   IRON 112 02/14/2022 0715   TIBC 245 (L) 02/14/2022 0715   FERRITIN 1,395 (H) 02/14/2022 0715   IRONPCTSAT 46 (H) 02/14/2022 0715   Lipid Panel  No results found for: CHOL, TRIG, HDL, CHOLHDL, VLDL, LDLCALC, LDLDIRECT Hepatic Function Panel     Component Value Date/Time   PROT 6.8 03/09/2024 1335   PROT 6.3 07/14/2023 1105   ALBUMIN 3.6 03/09/2024 1335   ALBUMIN 3.8 (L) 07/14/2023 1105   AST 24 03/09/2024 1335   ALT 22 03/09/2024 1335   ALKPHOS 41 03/09/2024 1335   BILITOT 0.9 03/09/2024 1335   BILITOT 0.5 07/14/2023 1105   BILIDIR 0.2 02/22/2022 1549    No results found for: TSH   Assessment and Plan:   Vitamin D  deficiency Will continue to monitor  Obesity, Class III, BMI 40-49.9 (morbid obesity)        Obesity Treatment / Action Plan:  Patient will work on garnering support from family and friends to begin weight loss journey. Will work on eliminating or reducing the presence of highly palatable, calorie dense foods in the home. Will complete provided nutritional and psychosocial assessment questionnaire before the next appointment. Will be scheduled for indirect calorimetry to determine resting energy expenditure in a fasting state.  This will allow us  to create a reduced calorie, high-protein meal plan to promote loss of fat mass while preserving muscle mass. Counseled on the health benefits of losing 5%-15% of total body weight. Was counseled on nutritional approaches to weight loss and benefits of reducing processed foods and consuming plant-based foods and high quality protein as part of nutritional weight management. Was counseled on pharmacotherapy and role as an adjunct in weight management.   Obesity Education Performed Today:  She was weighed on the bioimpedance scale and results were discussed and documented in the synopsis.  We discussed obesity as a disease and the importance of a more detailed evaluation of all the factors contributing to the disease.  We discussed the importance of long term lifestyle changes which include nutrition, exercise and behavioral modifications as well as the importance of customizing this to her specific health and social needs.  We discussed the benefits of reaching a healthier weight to alleviate the symptoms of existing conditions and reduce the risks of the biomechanical, metabolic and psychological effects of obesity.  Maria Morgan appears to be in the action stage of change and states they are ready to start intensive lifestyle modifications and behavioral  modifications.  30 minutes was spent today on this visit including the above counseling, pre-visit chart review, and post-visit documentation.  Reviewed by clinician on day of visit: allergies, medications, problem list, medical history, surgical history, family history, social history, and previous encounter notes pertinent to obesity diagnosis.    Corean SAUNDERS [place] FNP-C

## 2024-05-19 ENCOUNTER — Other Ambulatory Visit (HOSPITAL_BASED_OUTPATIENT_CLINIC_OR_DEPARTMENT_OTHER): Payer: Self-pay

## 2024-05-19 ENCOUNTER — Other Ambulatory Visit: Payer: Self-pay

## 2024-06-25 ENCOUNTER — Encounter: Admitting: Nurse Practitioner

## 2024-06-28 ENCOUNTER — Other Ambulatory Visit (HOSPITAL_BASED_OUTPATIENT_CLINIC_OR_DEPARTMENT_OTHER): Payer: Self-pay

## 2024-07-05 ENCOUNTER — Encounter: Payer: Self-pay | Admitting: Nurse Practitioner

## 2024-07-15 ENCOUNTER — Ambulatory Visit: Payer: Self-pay | Admitting: Nurse Practitioner

## 2024-07-15 ENCOUNTER — Ambulatory Visit: Admitting: Nurse Practitioner

## 2024-07-15 ENCOUNTER — Other Ambulatory Visit (HOSPITAL_BASED_OUTPATIENT_CLINIC_OR_DEPARTMENT_OTHER): Payer: Self-pay

## 2024-07-15 ENCOUNTER — Encounter: Payer: Self-pay | Admitting: Nurse Practitioner

## 2024-07-15 VITALS — BP 130/78 | HR 78 | Temp 98.8°F | Ht 68.0 in | Wt 320.0 lb

## 2024-07-15 DIAGNOSIS — Z131 Encounter for screening for diabetes mellitus: Secondary | ICD-10-CM

## 2024-07-15 DIAGNOSIS — Z0001 Encounter for general adult medical examination with abnormal findings: Secondary | ICD-10-CM | POA: Diagnosis not present

## 2024-07-15 DIAGNOSIS — J069 Acute upper respiratory infection, unspecified: Secondary | ICD-10-CM

## 2024-07-15 DIAGNOSIS — Z30011 Encounter for initial prescription of contraceptive pills: Secondary | ICD-10-CM | POA: Insufficient documentation

## 2024-07-15 DIAGNOSIS — Z13220 Encounter for screening for lipoid disorders: Secondary | ICD-10-CM

## 2024-07-15 LAB — CBC WITH DIFFERENTIAL/PLATELET
Basophils Absolute: 0 K/uL (ref 0.0–0.1)
Basophils Relative: 0.3 % (ref 0.0–3.0)
Eosinophils Absolute: 0.1 K/uL (ref 0.0–0.7)
Eosinophils Relative: 2.7 % (ref 0.0–5.0)
HCT: 38.3 % (ref 36.0–46.0)
Hemoglobin: 12.8 g/dL (ref 12.0–15.0)
Lymphocytes Relative: 24.1 % (ref 12.0–46.0)
Lymphs Abs: 1.1 K/uL (ref 0.7–4.0)
MCHC: 33.5 g/dL (ref 30.0–36.0)
MCV: 87 fl (ref 78.0–100.0)
Monocytes Absolute: 0.4 K/uL (ref 0.1–1.0)
Monocytes Relative: 9.8 % (ref 3.0–12.0)
Neutro Abs: 2.8 K/uL (ref 1.4–7.7)
Neutrophils Relative %: 63.1 % (ref 43.0–77.0)
Platelets: 226 K/uL (ref 150.0–400.0)
RBC: 4.4 Mil/uL (ref 3.87–5.11)
RDW: 12.6 % (ref 11.5–15.5)
WBC: 4.4 K/uL (ref 4.0–10.5)

## 2024-07-15 LAB — COMPREHENSIVE METABOLIC PANEL WITH GFR
ALT: 17 U/L (ref 0–35)
AST: 17 U/L (ref 0–37)
Albumin: 4 g/dL (ref 3.5–5.2)
Alkaline Phosphatase: 48 U/L (ref 39–117)
BUN: 8 mg/dL (ref 6–23)
CO2: 30 meq/L (ref 19–32)
Calcium: 9.2 mg/dL (ref 8.4–10.5)
Chloride: 103 meq/L (ref 96–112)
Creatinine, Ser: 0.79 mg/dL (ref 0.40–1.20)
GFR: 98.53 mL/min (ref >60.00)
Glucose, Bld: 108 mg/dL — ABNORMAL HIGH (ref 70–99)
Potassium: 3.6 meq/L (ref 3.5–5.1)
Sodium: 140 meq/L (ref 135–145)
Total Bilirubin: 0.6 mg/dL (ref 0.2–1.2)
Total Protein: 7.2 g/dL (ref 6.0–8.3)

## 2024-07-15 LAB — POC COVID19 BINAXNOW: SARS Coronavirus 2 Ag: NEGATIVE

## 2024-07-15 LAB — LIPID PANEL
Cholesterol: 212 mg/dL — ABNORMAL HIGH (ref 0–200)
HDL: 46.7 mg/dL (ref >39.00)
LDL Cholesterol: 137 mg/dL — ABNORMAL HIGH (ref 0–99)
NonHDL: 165.58
Total CHOL/HDL Ratio: 5
Triglycerides: 144 mg/dL (ref 0.0–149.0)
VLDL: 28.8 mg/dL (ref 0.0–40.0)

## 2024-07-15 LAB — POCT INFLUENZA A/B
Influenza A, POC: NEGATIVE
Influenza B, POC: NEGATIVE

## 2024-07-15 LAB — HEMOGLOBIN A1C: Hgb A1c MFr Bld: 5.5 % (ref 4.6–6.5)

## 2024-07-15 MED ORDER — BENZONATATE 100 MG PO CAPS
100.0000 mg | ORAL_CAPSULE | Freq: Three times a day (TID) | ORAL | 0 refills | Status: DC | PRN
  Filled 2024-07-15: qty 30, 10d supply, fill #0

## 2024-07-15 MED ORDER — SLYND 4 MG PO TABS
1.0000 | ORAL_TABLET | Freq: Every day | ORAL | 3 refills | Status: AC
  Filled 2024-07-15: qty 84, 84d supply, fill #0
  Filled 2024-08-17 – 2024-10-11 (×2): qty 84, 84d supply, fill #1

## 2024-07-15 NOTE — Patient Instructions (Signed)
 It was great to see you!  Start birth control pill once a day, use a back up method for 2 weeks   Reach out to GYN about implant or IUD   Drink plenty of fluids, get rest  Start tessalon  perles 3 times a day as needed for cough  Let's follow-up in 1 year, sooner if you have concerns.  If a referral was placed today, you will be contacted for an appointment. Please note that routine referrals can sometimes take up to 3-4 weeks to process. Please call our office if you haven't heard anything after this time frame.  Take care,  Tinnie Harada, NP

## 2024-07-15 NOTE — Progress Notes (Signed)
 BP 130/78 (BP Location: Left Arm, Patient Position: Sitting, Cuff Size: Large)   Pulse 78   Temp 98.8 F (37.1 C) (Oral)   Ht 5' 8 (1.727 m)   Wt (!) 320 lb (145.2 kg)   LMP 07/06/2024 (Exact Date)   SpO2 98%   Breastfeeding Yes   BMI 48.66 kg/m    Subjective:    Patient ID: Maria Morgan, female    DOB: Dec 14, 1990, 33 y.o.   MRN: [id number]  CC: Chief Complaint  Patient presents with   Annual Exam    With non fasting labs, wants to start on contraceptives, head congestion, headache, concerns with coughing up a lot of mucous, coughing    HPI: Maria Morgan is a 33 y.o. female presenting on 07/15/2024 for comprehensive medical examination. Current medical complaints include:cough, congestion  Discussed the use of AI scribe software for clinical note transcription with the patient, who gave verbal consent to proceed.  Symptoms began on Tuesday afternoon with mild mucus production and an itchy throat. By Wednesday, she developed thick, hard mucus with some blood and a pounding headache across her forehead and eyes, which was debilitating and kept her bedridden until today. She has a stuffy and runny nose but no fever. She has not taken any medication yet but plans to take Benadryl  after the appointment. She has not performed a COVID test at home. She denies shortness of breath and chest pain.      She currently lives with: husband, kids Menopausal Symptoms: no  Depression and Anxiety Screen done today and results listed below:     07/15/2024    1:11 PM 03/25/2024   10:13 AM 06/03/2023    9:30 AM 08/16/2022   11:21 AM 03/05/2022    9:12 AM  Depression screen PHQ 2/9  Decreased Interest 1 0 1 0 0  Down, Depressed, Hopeless 0 0 1 0 0  PHQ - 2 Score 1 0 2 0 0  Altered sleeping 2 2 1     Tired, decreased energy 1 2 2     Change in appetite 1 2 1     Feeling bad or failure about yourself  0 0 0    Trouble concentrating 1 1 1     Moving slowly or fidgety/restless 1 1      Suicidal thoughts 0 0 1    PHQ-9 Score 7 8 8     Difficult doing work/chores Somewhat difficult Somewhat difficult         07/15/2024    1:11 PM 03/25/2024   10:13 AM 06/03/2023    9:30 AM  GAD 7 : Generalized Anxiety Score  Nervous, Anxious, on Edge 0 1 1  Control/stop worrying 0 0 1  Worry too much - different things 0 2 1  Trouble relaxing 0 2 0  Restless 1 1 0  Easily annoyed or irritable 1 2 2   Afraid - awful might happen 0 0 0  Total GAD 7 Score 2 8 5   Anxiety Difficulty Not difficult at all Very difficult     The patient does not have a history of falls. I did not complete a risk assessment for falls. A plan of care for falls was not documented.   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 (Patient not taking: Reported on 07/15/2024)   cyclobenzaprine  (FLEXERIL ) 5 MG tablet Take 1 tablet (5 mg total) by mouth 3 (three) times daily as needed for muscle spasms.   furosemide  (LASIX ) 20 MG tablet Take 1 tablet (20 mg total) by mouth daily for 5 days. (Patient not taking: Reported on 04/21/2024)   ibuprofen  (ADVIL ) 600 MG tablet Take 1 tablet (600 mg total) by mouth every 6 (six) hours as needed for mild pain (pain score 1-3) or moderate pain (pain score 4-6). (Patient not taking: Reported on 04/21/2024)   Prenatal Vit-Fe Fumarate-FA (PRENATAL VITAMIN) 27-0.8 MG TABS Take 1 capsule by mouth daily.   senna-docusate (SENOKOT-S) 8.6-50 MG tablet  Take 2 tablets by mouth at bedtime as needed for mild constipation or moderate constipation.   valACYclovir  (VALTREX ) 1000 MG tablet Take 1 tablet (1,000 mg total) by mouth daily for 5 days.   No current facility-administered medications on file prior to visit.    Allergies:  Allergies  Allergen Reactions   Iodine Shortness Of Breath and Itching    Contrast in IV    Social History:  Social History   Socioeconomic History   Marital status: Legally Separated    Spouse name: Not on file   Number of children: 2   Years of education: Not on file   Highest education level: Not on file  Occupational History   Not on file  Tobacco Use   Smoking status: Never   Smokeless tobacco: Never  Vaping Use   Vaping status: Never Used  Substance and Sexual Activity   Alcohol use: Not Currently   Drug use: No   Sexual activity: Yes    Birth control/protection: None  Other Topics Concern   Not on file  Social History Narrative   Not on file   Social Drivers of Health   Financial Resource Strain: Not on file  Food Insecurity: No Food Insecurity (12/17/2023)   Hunger Vital Sign    Worried About Running Out of Food in the Last Year: Never true    Ran Out of Food in the Last Year: Never true  Transportation Needs: No Transportation Needs (12/17/2023)   PRAPARE - Administrator, Civil Service (Medical): No    Lack of Transportation (Non-Medical): No  Physical Activity: Not on file  Stress: Not on file  Social Connections: Unknown (04/03/2023)   Received from [facility]   Social Network    Social Network: Not on file  Intimate Partner Violence: Not At Risk (12/17/2023)   Humiliation, Afraid, Rape, and Kick questionnaire    Fear of Current or Ex-Partner: No    Emotionally Abused: No    Physically Abused: No    Sexually Abused: No   Social History   Tobacco Use  Smoking Status Never  Smokeless Tobacco Never   Social History   Substance and Sexual Activity  Alcohol  Use Not Currently    Family History:  Family History  Problem Relation Age of Onset   Cancer Mother        colon   Diabetes Paternal Grandfather    Diabetes Other  Past medical history, surgical history, medications, allergies, family history and social history reviewed with patient today and changes made to appropriate areas of the chart.   Review of Systems  Constitutional:  Positive for malaise/fatigue. Negative for fever.  HENT:  Positive for congestion and sore throat (scractchy). Negative for ear pain.   Respiratory:  Positive for cough and sputum production. Negative for shortness of breath.   Cardiovascular: Negative.   Gastrointestinal: Negative.   Genitourinary: Negative.   Musculoskeletal: Negative.   Skin: Negative.   Neurological: Negative.   Psychiatric/Behavioral: Negative.     All other ROS negative except what is listed above and in the HPI.      Objective:    BP 130/78 (BP Location: Left Arm, Patient Position: Sitting, Cuff Size: Large)   Pulse 78   Temp 98.8 F (37.1 C) (Oral)   Ht 5' 8 (1.727 m)   Wt (!) 320 lb (145.2 kg)   LMP 07/06/2024 (Exact Date)   SpO2 98%   Breastfeeding Yes   BMI 48.66 kg/m   Wt Readings from Last 3 Encounters:  07/15/24 (!) 320 lb (145.2 kg)  04/21/24 (!) 318 lb (144.2 kg)  03/25/24 (!) 323 lb 6.4 oz (146.7 kg)    Physical Exam Vitals and nursing note reviewed.  Constitutional:      General: She is not in acute distress.    Appearance: Normal appearance.  HENT:     Head: Normocephalic and atraumatic.     Right Ear: Tympanic membrane, ear canal and external ear normal.     Left Ear: Tympanic membrane, ear canal and external ear normal.     Nose: Congestion and rhinorrhea present.  Eyes:     Conjunctiva/sclera: Conjunctivae normal.  Cardiovascular:     Rate and Rhythm: Normal rate and regular rhythm.     Pulses: Normal pulses.     Heart sounds: Normal heart sounds.  Pulmonary:     Effort: Pulmonary effort  is normal.     Breath sounds: Normal breath sounds.     Comments: Cough frequently during exam Abdominal:     Palpations: Abdomen is soft.     Tenderness: There is no abdominal tenderness.  Musculoskeletal:        General: Normal range of motion.     Cervical back: Normal range of motion and neck supple.     Right lower leg: No edema.     Left lower leg: No edema.  Lymphadenopathy:     Cervical: No cervical adenopathy.  Skin:    General: Skin is warm and dry.  Neurological:     General: No focal deficit present.     Mental Status: She is alert and oriented to person, place, and time.     Cranial Nerves: No cranial nerve deficit.     Coordination: Coordination normal.     Gait: Gait normal.  Psychiatric:        Mood and Affect: Mood normal.        Behavior: Behavior normal.        Thought Content: Thought content normal.        Judgment: Judgment normal.     Results for orders placed or performed in visit on 07/15/24  POC COVID-19 BinaxNow   Collection Time: 07/15/24  1:54 PM  Result Value Ref Range   SARS Coronavirus 2 Ag Negative Negative  POCT Influenza A/B   Collection Time: 07/15/24  1:54 PM  Result Value Ref Range   Influenza A, POC  Negative Negative   Influenza B, POC Negative Negative      Assessment & Plan:   Problem List Items Addressed This Visit       Other   Morbid obesity (HCC)   BMI 48.6. Discussed nutrition, exercise.       Relevant Orders   CBC with Differential/Platelet   Comprehensive metabolic panel with GFR   Encounter for initial prescription of contraceptive pills   Discussed contraceptive options. She would like the nexplanon. Discussed that we can't place it here, but can be done at GYN. She will reach out to her GYN. In the meantime, will start slynd 1 tablet daily.       Other Visit Diagnoses       Encounter for general adult medical examination with abnormal findings    -  Primary   Health maintenace reviewed and updated.  Discussed nutrition, exercise. Follow-up in1 year     Upper respiratory tract infection, unspecified type       Flu and covid-19 negative. Encourage fluids, rest. Start tessalon  perles TID prn cough.   Relevant Orders   POC COVID-19 BinaxNow (Completed)   POCT Influenza A/B (Completed)     Screening for diabetes mellitus       Screen A1c, CMP today   Relevant Orders   Comprehensive metabolic panel with GFR   Hemoglobin A1c     Screening, lipid       Screen lipid today   Relevant Orders   Lipid panel       Follow up plan: Return in about 1 year (around 07/15/2025) for CPE.   LABORATORY TESTING:  - Pap smear: up to date  IMMUNIZATIONS:   - Tdap: Tetanus vaccination status reviewed: last tetanus booster within 10 years. - Influenza: Declined - Pneumovax: Not applicable - Prevnar: Not applicable - HPV: Up to date - Shingrix vaccine: Not applicable  SCREENING: -Mammogram: Not applicable  - Colonoscopy: Not applicable  - Bone Density: Not applicable   PATIENT COUNSELING:   Advised to take 1 mg of folate supplement per day if capable of pregnancy.   Sexuality: Discussed sexually transmitted diseases, partner selection, use of condoms, avoidance of unintended pregnancy  and contraceptive alternatives.   Advised to avoid cigarette smoking.  I discussed with the patient that most people either abstain from alcohol or drink within safe limits (<=14/week and <=4 drinks/occasion for males, <=7/weeks and <= 3 drinks/occasion for females) and that the risk for alcohol disorders and other health effects rises proportionally with the number of drinks per week and how often a drinker exceeds daily limits.  Discussed cessation/primary prevention of drug use and availability of treatment for abuse.   Diet: Encouraged to adjust caloric intake to maintain  or achieve ideal body weight, to reduce intake of dietary saturated fat and total fat, to limit sodium intake by avoiding high sodium  foods and not adding table salt, and to maintain adequate dietary potassium and calcium preferably from fresh fruits, vegetables, and low-fat dairy products.    stressed the importance of regular exercise  Injury prevention: Discussed safety belts, safety helmets, smoke detector, smoking near bedding or upholstery.   Dental health: Discussed importance of regular tooth brushing, flossing, and dental visits.    NEXT PREVENTATIVE PHYSICAL DUE IN 1 YEAR. Return in about 1 year (around 07/15/2025) for CPE.  Ishaan Villamar A Loraine Freid

## 2024-07-15 NOTE — Assessment & Plan Note (Signed)
 BMI 48.6. Discussed nutrition, exercise.

## 2024-07-15 NOTE — Assessment & Plan Note (Signed)
 Discussed contraceptive options. She would like the nexplanon. Discussed that we can't place it here, but can be done at GYN. She will reach out to her GYN. In the meantime, will start slynd 1 tablet daily.

## 2024-08-10 NOTE — Telephone Encounter (Signed)
 Requesting: valACYclovir  (VALTREX ) 1000 MG tablet  Last Visit: 07/15/2024 Next Visit: Visit date not found Last Refill: 02/19/2024 by Dr. Jacob Stinson  Please Advise   PATIENT REQUEST A 90 DAY SUPPLY

## 2024-08-17 ENCOUNTER — Other Ambulatory Visit (HOSPITAL_BASED_OUTPATIENT_CLINIC_OR_DEPARTMENT_OTHER): Payer: Self-pay

## 2024-08-18 ENCOUNTER — Other Ambulatory Visit: Payer: Self-pay

## 2024-08-27 ENCOUNTER — Other Ambulatory Visit (HOSPITAL_BASED_OUTPATIENT_CLINIC_OR_DEPARTMENT_OTHER): Payer: Self-pay

## 2024-08-27 MED ORDER — MEFLOQUINE HCL 250 MG PO TABS
ORAL_TABLET | ORAL | 0 refills | Status: DC
  Filled 2024-08-27: qty 10, 70d supply, fill #0

## 2024-09-01 ENCOUNTER — Telehealth: Payer: Self-pay

## 2024-09-01 NOTE — Telephone Encounter (Signed)
 Called patient to see if she was having any problems as she is scheduled for an annual exam on 11/17/23 with Harlene Duncans. She will not need an annual until February.

## 2024-09-16 ENCOUNTER — Other Ambulatory Visit (HOSPITAL_BASED_OUTPATIENT_CLINIC_OR_DEPARTMENT_OTHER): Payer: Self-pay

## 2024-10-11 ENCOUNTER — Other Ambulatory Visit: Payer: Self-pay | Admitting: Medical Genetics

## 2024-10-11 ENCOUNTER — Encounter: Payer: Self-pay | Admitting: Nurse Practitioner

## 2024-10-21 ENCOUNTER — Inpatient Hospital Stay (HOSPITAL_BASED_OUTPATIENT_CLINIC_OR_DEPARTMENT_OTHER)
Admission: AD | Admit: 2024-10-21 | Discharge: 2024-10-21 | Discharge status: Against Medical Advice | Attending: Obstetrics & Gynecology | Admitting: Obstetrics & Gynecology

## 2024-10-21 ENCOUNTER — Encounter (HOSPITAL_BASED_OUTPATIENT_CLINIC_OR_DEPARTMENT_OTHER): Payer: Self-pay

## 2024-10-21 ENCOUNTER — Encounter (HOSPITAL_COMMUNITY): Payer: Self-pay | Admitting: Obstetrics & Gynecology

## 2024-10-21 ENCOUNTER — Other Ambulatory Visit: Payer: Self-pay

## 2024-10-21 ENCOUNTER — Inpatient Hospital Stay (HOSPITAL_COMMUNITY)
Admission: AD | Admit: 2024-10-21 | Discharge: 2024-10-21 | Disposition: A | Discharge status: Home / Self Care | Source: Home / Self Care | Attending: Obstetrics & Gynecology | Admitting: Obstetrics & Gynecology

## 2024-10-21 ENCOUNTER — Inpatient Hospital Stay (HOSPITAL_COMMUNITY)

## 2024-10-21 DIAGNOSIS — O00101 Right tubal pregnancy without intrauterine pregnancy: Secondary | ICD-10-CM | POA: Insufficient documentation

## 2024-10-21 DIAGNOSIS — O009 Unspecified ectopic pregnancy without intrauterine pregnancy: Secondary | ICD-10-CM

## 2024-10-21 DIAGNOSIS — R109 Unspecified abdominal pain: Secondary | ICD-10-CM | POA: Diagnosis present

## 2024-10-21 LAB — URINALYSIS, ROUTINE W REFLEX MICROSCOPIC
Bilirubin Urine: NEGATIVE
Glucose, UA: NEGATIVE mg/dL
Hgb urine dipstick: NEGATIVE
Ketones, ur: NEGATIVE mg/dL
Leukocytes,Ua: NEGATIVE
Nitrite: NEGATIVE
Protein, ur: NEGATIVE mg/dL
Specific Gravity, Urine: 1.02 (ref 1.005–1.030)
pH: 5.5 (ref 5.0–8.0)

## 2024-10-21 LAB — CBC
HCT: 36.9 % (ref 36.0–46.0)
Hemoglobin: 12.7 g/dL (ref 12.0–15.0)
MCH: 29.7 pg (ref 26.0–34.0)
MCHC: 34.4 g/dL (ref 30.0–36.0)
MCV: 86.4 fL (ref 80.0–100.0)
Platelets: 255 K/uL (ref 150–400)
RBC: 4.27 MIL/uL (ref 3.87–5.11)
RDW: 12.9 % (ref 11.5–15.5)
WBC: 6 K/uL (ref 4.0–10.5)
nRBC: 0 % (ref 0.0–0.2)

## 2024-10-21 LAB — COMPREHENSIVE METABOLIC PANEL WITH GFR
ALT: 16 U/L (ref 0–44)
AST: 17 U/L (ref 15–41)
Albumin: 4.2 g/dL (ref 3.5–5.0)
Alkaline Phosphatase: 60 U/L (ref 38–126)
Anion gap: 15 (ref 5–15)
BUN: 12 mg/dL (ref 6–20)
CO2: 20 mmol/L — ABNORMAL LOW (ref 22–32)
Calcium: 9.2 mg/dL (ref 8.9–10.3)
Chloride: 101 mmol/L (ref 98–111)
Creatinine, Ser: 0.74 mg/dL (ref 0.44–1.00)
GFR, Estimated: >60 mL/min
Glucose, Bld: 117 mg/dL — ABNORMAL HIGH (ref 70–99)
Potassium: 3.7 mmol/L (ref 3.5–5.1)
Sodium: 136 mmol/L (ref 135–145)
Total Bilirubin: 0.3 mg/dL (ref 0.0–1.2)
Total Protein: 7 g/dL (ref 6.5–8.1)

## 2024-10-21 LAB — HCG, QUANTITATIVE, PREGNANCY: hCG, Beta Chain, Quant, S: 866 m[IU]/mL — ABNORMAL HIGH

## 2024-10-21 LAB — LIPASE, BLOOD: Lipase: 41 U/L (ref 11–51)

## 2024-10-21 LAB — HCG, SERUM, QUALITATIVE: Preg, Serum: POSITIVE — AB

## 2024-10-21 MED ORDER — ONDANSETRON 4 MG PO TBDP
8.0000 mg | ORAL_TABLET | Freq: Once | ORAL | Status: AC
  Administered 2024-10-21: 8 mg via ORAL
  Filled 2024-10-21: qty 2

## 2024-10-21 MED ORDER — METHOTREXATE FOR ECTOPIC PREGNANCY
50.0000 mg/m2 | Freq: Once | INTRAMUSCULAR | Status: DC
  Filled 2024-10-21: qty 4.3

## 2024-10-21 MED ORDER — METHOTREXATE FOR ECTOPIC PREGNANCY
50.0000 mg/m2 | Freq: Once | INTRAMUSCULAR | Status: AC
  Administered 2024-10-21: 107.5 mg via INTRAMUSCULAR
  Filled 2024-10-21: qty 4.3

## 2024-10-21 NOTE — Discharge Instructions (Addendum)
 The risks of methotrexate  were reviewed including failure requiring repeat dosing or eventual surgery. Please understand that methotrexate  involves frequent return visits to monitor lab values and that you remain at risk of ectopic rupture until her beta is less than assay. ?Since you have no history of hepatic or renal dysfunction, has normal BUN/Cr/LFT's/platelets.  You are felt to be reliable for follow-up. Side effects of photosensitivity & GI upset were discussed.  Please avoid direct sunlight and abstain from alcohol, NSAIDs and sexual intercourse for two weeks. Discontinue any MVI with folic acid . Please follow up on D4 (Sunday 10/24/2024) and D7 (Wednesday 10/27/2024 - [facility]) for repeat BHCG and was given the instruction sheet. Call OB office with any abdominal pain, vomiting, fainting, or any concerns with your health.   Day 0/1 Day 4 Day 7  Sunday Wednesday Saturday  Monday Thursday Sunday  Tuesday Friday Monday  Wednesday Saturday Tuesday  Thursday Sunday Wednesday  Friday Monday Thursday  Saturday Tuesday Friday

## 2024-10-21 NOTE — MAU Provider Note (Signed)
 " History     CSN: [id number]  Arrival date and time: 10/21/24 1626   Event Date/Time   First Provider Initiated Contact with Patient 10/21/24 1744      Chief Complaint  Patient presents with   Ectopic Pregnancy   Injections   HPI [name] is a 34 y.o. year old [id number] female at [redacted]w[redacted]d weeks gestation who presents to MAU MTX injection. She was in MAU earlier today and dx'd with ectopic pregnancy. She was counseled at that time about receiving MTX. She had to leave due to childcare issues. She has returned now for her injection. All the labs were completed earlier today. She plans to receive [facility].    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   Benign neoplasm of colon 02/20/2012   Brain tumor (benign) (HCC)    Chlamydia    Gonorrhea    Hyperprolactinemia 09/26/2015   Malaria    Obesity, Class III, BMI 40-49.9 (morbid obesity) (HCC) 02/11/2022    Past Surgical History:  Procedure Laterality Date   NO PAST SURGERIES      Family History  Problem Relation Age of Onset   Cancer Mother        colon   Diabetes Paternal Grandfather    Diabetes Other     Social History[1]  Allergies: Allergies[2]  Medications Prior to Admission  Medication Sig Dispense Refill Last Dose/Taking   acetaminophen  (TYLENOL ) 500 MG tablet Take 1,000 mg by mouth every 6 (six) hours as needed. (Patient not taking: Reported on 07/15/2024)      benzonatate  (TESSALON ) 100 MG capsule Take 1 capsule (100 mg total) by mouth 3 (three) times daily as needed for cough. 30 capsule 0    cyclobenzaprine  (FLEXERIL ) 5 MG tablet Take 1 tablet (5 mg total) by mouth 3 (three) times daily as needed for muscle spasms. 30 tablet 1    Drospirenone  (SLYND ) 4 MG TABS Take 1 tablet (4 mg  total) by mouth daily at 6 (six) AM. 84 tablet 3    furosemide  (LASIX ) 20 MG tablet Take 1 tablet (20 mg total) by mouth daily for 5 days. (Patient not taking: Reported on 04/21/2024) 7 tablet 0    ibuprofen  (ADVIL ) 600 MG tablet Take 1 tablet (600 mg total) by mouth every 6 (six) hours as needed for mild pain (pain score 1-3) or moderate pain (pain score 4-6). (Patient not taking: Reported on 04/21/2024) 60 tablet 1    mefloquine  (LARIAM ) 250 MG tablet Take 1 tablet (250mg ) by mouth weekly starting 2 weeks prior to travel to mal[place] region and ending 4 weeks after leaving area. 10 tablet 0    Prenatal Vit-Fe Fumarate-FA (PRENATAL VITAMIN) 27-0.8 MG TABS Take 1 capsule by mouth daily. 30 tablet 0    senna-docusate (SENOKOT-S) 8.6-50 MG tablet Take 2 tablets by mouth at bedtime as needed for mild constipation or moderate constipation. 60 tablet 1    valACYclovir  (VALTREX ) 1000 MG tablet Take 1 tablet (1,000 mg total) by mouth daily  for 5 days. 5 tablet 6     Review of Systems  Constitutional: Negative.   HENT: Negative.    Eyes: Negative.   Respiratory: Negative.    Cardiovascular: Negative.   Gastrointestinal: Negative.   Endocrine: Negative.   Genitourinary:  Positive for pelvic pain.  Musculoskeletal: Negative.   Skin: Negative.   Allergic/Immunologic: Negative.   Neurological: Negative.   Hematological: Negative.   Psychiatric/Behavioral: Negative.     Physical Exam   Blood pressure (!) 145/75, pulse 89, temperature 98 F (36.7 C), resp. rate 18, last menstrual period 09/23/2024, currently breastfeeding.  Physical Exam Vitals and nursing note reviewed.  Constitutional:      Appearance: Normal appearance. She is obese.  Cardiovascular:     Rate and Rhythm: Normal rate.  Pulmonary:     Effort: Pulmonary effort is normal.  Musculoskeletal:        General: Normal range of motion.  Skin:    General: Skin is warm and dry.  Neurological:     Mental Status: She is alert and  oriented to person, place, and time.  Psychiatric:        Mood and Affect: Mood normal.        Behavior: Behavior normal.        Thought Content: Thought content normal.        Judgment: Judgment normal.    MAU Course  Procedures  MDM Results for orders placed or performed during the hospital encounter of 10/21/24 (from the past 24 hours)  Lipase, blood     Status: None   Collection Time: 10/21/24  1:35 AM  Result Value Ref Range   Lipase 41 11 - 51 U/L  Comprehensive metabolic panel     Status: Abnormal   Collection Time: 10/21/24  1:35 AM  Result Value Ref Range   Sodium 136 135 - 145 mmol/L   Potassium 3.7 3.5 - 5.1 mmol/L   Chloride 101 98 - 111 mmol/L   CO2 20 (L) 22 - 32 mmol/L   Glucose, Bld 117 (H) 70 - 99 mg/dL   BUN 12 6 - 20 mg/dL   Creatinine, Ser 9.25 0.44 - 1.00 mg/dL   Calcium 9.2 8.9 - 89.6 mg/dL   Total Protein 7.0 6.5 - 8.1 g/dL   Albumin 4.2 3.5 - 5.0 g/dL   AST 17 15 - 41 U/L   ALT 16 0 - 44 U/L   Alkaline Phosphatase 60 38 - 126 U/L   Total Bilirubin 0.3 0.0 - 1.2 mg/dL   GFR, Estimated >39 >39 mL/min   Anion gap 15 5 - 15  CBC     Status: None   Collection Time: 10/21/24  1:35 AM  Result Value Ref Range   WBC 6.0 4.0 - 10.5 K/uL   RBC 4.27 3.87 - 5.11 MIL/uL   Hemoglobin 12.7 12.0 - 15.0 g/dL   HCT 63.0 63.9 - 53.9 %   MCV 86.4 80.0 - 100.0 fL   MCH 29.7 26.0 - 34.0 pg   MCHC 34.4 30.0 - 36.0 g/dL   RDW 87.0 88.4 - 84.4 %   Platelets 255 150 - 400 K/uL   nRBC 0.0 0.0 - 0.2 %  hCG, serum, qualitative     Status: Abnormal   Collection Time: 10/21/24  1:35 AM  Result Value Ref Range   Preg, Serum POSITIVE (A) NEGATIVE  hCG, quantitative, pregnancy     Status: Abnormal   Collection Time: 10/21/24  1:35 AM  Result Value Ref  Range   hCG, Beta Chain, Quant, S 866 (H) <5 mIU/mL  Urinalysis, Routine w reflex microscopic -Urine, Clean Catch     Status: None   Collection Time: 10/21/24  5:16 AM  Result Value Ref Range   Color, Urine YELLOW YELLOW    APPearance CLEAR CLEAR   Specific Gravity, Urine 1.020 1.005 - 1.030   pH 5.5 5.0 - 8.0   Glucose, UA NEGATIVE NEGATIVE mg/dL   Hgb urine dipstick NEGATIVE NEGATIVE   Bilirubin Urine NEGATIVE NEGATIVE   Ketones, ur NEGATIVE NEGATIVE mg/dL   Protein, ur NEGATIVE NEGATIVE mg/dL   Nitrite NEGATIVE NEGATIVE   Leukocytes,Ua NEGATIVE NEGATIVE     Assessment and Plan  1. Ectopic pregnancy, unspecified location, unspecified whether intrauterine pregnancy present (Primary) - The risks of methotrexate  were reviewed including failure requiring repeat dosing or possible surgery. She understands that methotrexate  involves frequent return visits to monitor lab values and that she remains at risk of ectopic rupture until her beta is less than assay. ?The patient opts to proceed with methotrexate .  She has no history of hepatic or renal dysfunction, has normal BUN/Cr/LFT's/platelets. She is felt to be reliable for follow-up. Side effects of photosensitivity & GI upset were discussed.  She knows to avoid direct sunlight and abstain from alcohol, NSAIDs and sexual intercourse for two weeks. She was counseled to discontinue any MVI with folic acid . ?She understands to follow up on D4 (Sunday 10/24/2024) and D7 (Wednesday 10/27/2024 at [facility] ) for repeat BHCG and was given the instruction sheet.   Strict ectopic precautions were reviewed, the patient knows to call with any abdominal pain, vomiting, fainting, or any concerns with her health.   Day 0/1 Day 4 Day 7  Sunday Wednesday Saturday  Monday Thursday Sunday  Tuesday Friday Monday  Wednesday Saturday Tuesday  Thursday Sunday Wednesday  Friday Monday Thursday  Saturday Tuesday Friday   - Discharge home - Keep scheduled appt with MAU on Sunday 10/24/2024 - Patient verbalized an understanding of the plan of care and agrees.    Ala Cart, CNM 10/21/2024, 6:14 PM     [1]  Social History Tobacco Use   Smoking status: Never   Smokeless tobacco:  Never  Vaping Use   Vaping status: Never Used  Substance Use Topics   Alcohol use: Not Currently   Drug use: No  [2]  Allergies Allergen Reactions   Iodine Shortness Of Breath and Itching    Contrast in IV   "

## 2024-10-21 NOTE — MAU Note (Signed)
 Pt unable to stay to receive methotrexate  at this time needs to get her children to school. Will come back this afternoon. Notifoed provider. Pt did not sign AMA form

## 2024-10-21 NOTE — MAU Note (Incomplete)
 Pt came back to get methotrexate  injection. Reports still having pain in  lefty side 6/10

## 2024-10-21 NOTE — ED Notes (Signed)
 Gave report to [facility] at [facility] for pt. POV transfer. Informed that pt. Has left the facility and headed to MAU. Pt. instructed not to make any stops.

## 2024-10-21 NOTE — ED Provider Notes (Signed)
 " [place] [facility] Provider Note   CSN: [id number] Arrival date & time: 10/21/24  0104     Patient presents with: Abdominal Pain   Maria Morgan is a 34 y.o. female.   The history is provided by the patient.  Abdominal Pain Maria Morgan is a 34 y.o. female who presents to the Emergency Department complaining of abdominal pain.  She presents to the emergency department for evaluation of left-sided abdominal pain has been present for the last 4 to 5 days with associated nausea.  She did take a pregnancy test on January 2 and it was positive.  She reports irregular menstrual cycles and starting on oral contraceptives in October.  She stopped taking them and December.  She has 3 prior full-term pregnancies.  Her youngest is 70 months old.  She has not breast-feeding.  She is not having any current vaginal bleeding but did have some spotting yesterday.  Last normal menstrual cycle was December 11.   Prior to Admission medications  Medication Sig Start Date End Date Taking? Authorizing Provider  acetaminophen  (TYLENOL ) 500 MG tablet Take 1,000 mg by mouth every 6 (six) hours as needed. Patient not taking: Reported on 07/15/2024    [provider]  benzonatate  (TESSALON ) 100 MG capsule Take 1 capsule (100 mg total) by mouth 3 (three) times daily as needed for cough. 07/15/24   McElwee, Lauren A, NP  cyclobenzaprine  (FLEXERIL ) 5 MG tablet Take 1 tablet (5 mg total) by mouth 3 (three) times daily as needed for muscle spasms. 03/25/24   McElwee, Tinnie LABOR, NP  Drospirenone  (SLYND ) 4 MG TABS Take 1 tablet (4 mg total) by mouth daily at 6 (six) AM. 07/15/24   McElwee, Lauren A, NP  furosemide  (LASIX ) 20 MG tablet Take 1 tablet (20 mg total) by mouth daily for 5 days. Patient not taking: Reported on 04/21/2024 12/20/23 12/25/23  Eveline Lynwood MATSU, MD  ibuprofen  (ADVIL ) 600 MG tablet Take 1 tablet (600 mg total) by mouth every 6 (six) hours as needed for mild  pain (pain score 1-3) or moderate pain (pain score 4-6). Patient not taking: Reported on 04/21/2024 12/20/23   Eveline Lynwood MATSU, MD  mefloquine  (LARIAM ) 250 MG tablet Take 1 tablet (250mg ) by mouth weekly starting 2 weeks prior to travel to malarious region and ending 4 weeks after leaving area. 08/25/24     Prenatal Vit-Fe Fumarate-FA (PRENATAL VITAMIN) 27-0.8 MG TABS Take 1 capsule by mouth daily. 05/28/23   Dean Clarity, MD  senna-docusate (SENOKOT-S) 8.6-50 MG tablet Take 2 tablets by mouth at bedtime as needed for mild constipation or moderate constipation. 12/20/23   Eveline Lynwood MATSU, MD  valACYclovir  (VALTREX ) 1000 MG tablet Take 1 tablet (1,000 mg total) by mouth daily for 5 days. 02/19/24   Stinson, Jacob J, DO    Allergies: Iodine    Review of Systems  Gastrointestinal:  Positive for abdominal pain.  All other systems reviewed and are negative.   Updated Vital Signs BP (!) 145/73   Pulse 88   Temp 97.6 F (36.4 C) (Oral)   Resp 20   Wt (!) 145.2 kg   SpO2 100%   BMI 48.66 kg/m   Physical Exam Vitals and nursing note reviewed.  Constitutional:      Appearance: She is well-developed.  HENT:     Head: Normocephalic and atraumatic.  Cardiovascular:     Rate and Rhythm: Normal rate and regular rhythm.  Pulmonary:  Effort: Pulmonary effort is normal. No respiratory distress.  Abdominal:     Palpations: Abdomen is soft.     Tenderness: There is no guarding or rebound.     Comments: Mild lower abdominal tenderness  Musculoskeletal:        General: No tenderness.  Skin:    General: Skin is warm and dry.  Neurological:     Mental Status: She is alert and oriented to person, place, and time.  Psychiatric:        Behavior: Behavior normal.     (all labs ordered are listed, but only abnormal results are displayed) Labs Reviewed  COMPREHENSIVE METABOLIC PANEL WITH GFR - Abnormal; Notable for the following components:      Result Value   CO2 20 (*)    Glucose, Bld 117  (*)    All other components within normal limits  HCG, SERUM, QUALITATIVE - Abnormal; Notable for the following components:   Preg, Serum POSITIVE (*)    All other components within normal limits  HCG, QUANTITATIVE, PREGNANCY - Abnormal; Notable for the following components:   hCG, Beta Chain, Quant, S 866 (*)    All other components within normal limits  LIPASE, BLOOD  CBC  URINALYSIS, ROUTINE W REFLEX MICROSCOPIC    EKG: None  Radiology: No results found.   Procedures   Medications Ordered in the ED  ondansetron  (ZOFRAN -ODT) disintegrating tablet 8 mg (8 mg Oral Given 10/21/24 0504)                                    Medical Decision Making Amount and/or Complexity of Data Reviewed Labs: ordered.  Risk Prescription drug management. Decision regarding hospitalization.   Patient is a G4, P3 here for evaluation of left-sided abdominal pain, nausea.  She did have a positive pregnancy test on January 2.  Has had spotting since then.  She has no significant tenderness on examination.  hCG is elevated at 866.  Ultrasound is not available at this time to evaluate for viability or IUP.  Discussed with Dr. Gerarda at [facility].  Plan to transfer to MAU for ultrasound to further evaluate pregnancy location. Current picture is not consistent with diverticulitis, torsion.  Bedside ultrasound attempted, unable to visualize uterus secondary to empty bladder.    Final diagnoses:  None    ED Discharge Orders     None          Griselda Norris, MD 10/21/24 [phone]  "

## 2024-10-21 NOTE — ED Triage Notes (Signed)
 Pt arrives with c/o ABD pain that started 3 days ago. Pt reports spotting and has had 3 positive home pregnancy.

## 2024-10-21 NOTE — MAU Provider Note (Addendum)
 " History     [id number]  Arrival date and time: 10/21/24 0104    Chief Complaint  Patient presents with   Abdominal Pain     HPI Maria Morgan is a 34 y.o. at [redacted]w[redacted]d who presents as a transfer from [facility] ED due for ectopic pregnancy rule out. She initially presented for left sided abdominal pain with some nausea. It has been going on since around 10/15/24. At that time she took a HPT and it was positive. She denies vaginal bleeding/discharge/irritation. Denies dysuria/urgency/frequency.    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/2012   Brain tumor (benign) (HCC)    Chlamydia    Gonorrhea    Hyperprolactinemia 09/26/2015   Malaria    Obesity, Class III, BMI 40-49.9 (morbid obesity) (HCC) 02/11/2022    Past Surgical History:  Procedure Laterality Date   NO PAST SURGERIES      Family History  Problem Relation Age of Onset   Cancer Mother        colon   Diabetes Paternal Grandfather    Diabetes Other     Social History   Socioeconomic History   Marital status: Legally Separated    Spouse name: Not on file   Number of children: 2   Years of education: Not on file   Highest education level: Not on file  Occupational History   Not on file  Tobacco Use   Smoking status: Never   Smokeless tobacco: Never  Vaping Use   Vaping status: Never Used  Substance and Sexual Activity   Alcohol use: Not Currently   Drug use: No   Sexual activity: Yes    Birth control/protection: None  Other Topics Concern   Not on file  Social History Narrative   Not on file   Social Drivers of Health   Tobacco Use: Low Risk (10/21/2024)   Patient History    Smoking Tobacco Use: Never    Smokeless Tobacco Use: Never    Passive Exposure: Not on file  Financial Resource Strain: Not on file  Food Insecurity: No Food Insecurity (12/17/2023)   Hunger Vital Sign    Worried About Running Out of  Food in the Last Year: Never true    Ran Out of Food in the Last Year: Never true  Transportation Needs: No Transportation Needs (12/17/2023)   PRAPARE - Administrator, Civil Service (Medical): No    Lack of Transportation (Non-Medical): No  Physical Activity: Not on file  Stress: Not on file  Social Connections: Unknown (04/03/2023)   Received from [facility]   Social Network    Social Network: Not on file  Intimate Partner Violence: Not At Risk (12/17/2023)   Humiliation, Afraid, Rape, and Kick questionnaire    Fear of Current or Ex-Partner: No    Emotionally Abused: No    Physically Abused: No    Sexually Abused: No  Depression (PHQ2-9): Medium Risk (07/15/2024)   Depression (PHQ2-9)    PHQ-2 Score: 7  Alcohol Screen: Not on file  Housing: Low Risk (12/17/2023)   Housing Stability Vital Sign    Unable to Pay for Housing in the Last Year: No    Number of Times Moved in the Last Year: 0    Homeless in the Last Year: No  Utilities: Not At Risk (12/17/2023)   AHC Utilities    Threatened with loss of utilities: No  Health Literacy: Not on file    Allergies[1]  Medications Ordered Prior to Encounter[2]  Pertinent positives and negative per HPI, all others reviewed and negative  Physical Exam   BP (!) 145/82 (BP Location: Right Arm)   Pulse 90   Temp 98.3 F (36.8 C) (Oral)   Resp 17   Ht 5' 8 (1.727 m)   Wt (!) 146.4 kg   LMP 09/23/2024   SpO2 98%   BMI 49.07 kg/m   Patient Vitals for the past 24 hrs:  BP Temp Temp src Pulse Resp SpO2 Height Weight  10/21/24 0606 (!) 145/82 98.3 F (36.8 C) Oral 90 17 98 % 5' 8 (1.727 m) (!) 146.4 kg  10/21/24 0507 (!) 145/73 97.6 F (36.4 C) Oral 88 20 100 % -- --  10/21/24 0128 -- -- -- -- -- -- -- (!) 145.2 kg  10/21/24 0125 (!) 143/93 (!) 97.5 F (36.4 C) Oral 80 19 100 % -- --    Physical Exam Vitals and nursing note reviewed.  Constitutional:      Appearance: She is well-developed.  HENT:     Head:  Normocephalic and atraumatic.     Mouth/Throat:     Mouth: Mucous membranes are moist.  Eyes:     Extraocular Movements: Extraocular movements intact.  Cardiovascular:     Rate and Rhythm: Normal rate and regular rhythm.  Pulmonary:     Effort: Pulmonary effort is normal.  Abdominal:     Palpations: Abdomen is soft.     Tenderness: There is abdominal tenderness.  Skin:    Capillary Refill: Capillary refill takes less than 2 seconds.  Neurological:     General: No focal deficit present.     Mental Status: She is alert.      Labs Results for orders placed or performed during the hospital encounter of 10/21/24 (from the past 24 hours)  Lipase, blood     Status: None   Collection Time: 10/21/24  1:35 AM  Result Value Ref Range   Lipase 41 11 - 51 U/L  Comprehensive metabolic panel     Status: Abnormal   Collection Time: 10/21/24  1:35 AM  Result Value Ref Range   Sodium 136 135 - 145 mmol/L   Potassium 3.7 3.5 - 5.1 mmol/L   Chloride 101 98 - 111 mmol/L   CO2 20 (L) 22 - 32 mmol/L   Glucose, Bld 117 (H) 70 - 99 mg/dL   BUN 12 6 - 20 mg/dL   Creatinine, Ser 9.25 0.44 - 1.00 mg/dL   Calcium 9.2 8.9 - 89.6 mg/dL   Total Protein 7.0 6.5 - 8.1 g/dL   Albumin 4.2 3.5 - 5.0 g/dL   AST 17 15 - 41 U/L   ALT 16 0 - 44 U/L   Alkaline Phosphatase 60 38 - 126 U/L   Total Bilirubin 0.3 0.0 - 1.2 mg/dL   GFR, Estimated >39 >39 mL/min   Anion gap 15 5 - 15  CBC     Status: None   Collection Time: 10/21/24  1:35 AM  Result Value Ref Range   WBC 6.0 4.0 - 10.5 K/uL   RBC 4.27 3.87 - 5.11 MIL/uL   Hemoglobin 12.7 12.0 - 15.0 g/dL   HCT 63.0 63.9 - 53.9 %   MCV 86.4 80.0 - 100.0 fL   MCH 29.7 26.0 - 34.0 pg   MCHC 34.4 30.0 - 36.0 g/dL   RDW 87.0 88.4 - 84.4 %   Platelets  255 150 - 400 K/uL   nRBC 0.0 0.0 - 0.2 %  hCG, serum, qualitative     Status: Abnormal   Collection Time: 10/21/24  1:35 AM  Result Value Ref Range   Preg, Serum POSITIVE (A) NEGATIVE  hCG, quantitative,  pregnancy     Status: Abnormal   Collection Time: 10/21/24  1:35 AM  Result Value Ref Range   hCG, Beta Chain, Quant, S 866 (H) <5 mIU/mL  Urinalysis, Routine w reflex microscopic -Urine, Clean Catch     Status: None   Collection Time: 10/21/24  5:16 AM  Result Value Ref Range   Color, Urine YELLOW YELLOW   APPearance CLEAR CLEAR   Specific Gravity, Urine 1.020 1.005 - 1.030   pH 5.5 5.0 - 8.0   Glucose, UA NEGATIVE NEGATIVE mg/dL   Hgb urine dipstick NEGATIVE NEGATIVE   Bilirubin Urine NEGATIVE NEGATIVE   Ketones, ur NEGATIVE NEGATIVE mg/dL   Protein, ur NEGATIVE NEGATIVE mg/dL   Nitrite NEGATIVE NEGATIVE   Leukocytes,Ua NEGATIVE NEGATIVE    Imaging US  OB LESS THAN 14 WEEKS WITH OB TRANSVAGINAL Result Date: 10/21/2024 EXAM: OBSTETRIC ULTRASOUND FIRST TRIMESTER TECHNIQUE: Transabdominal and Transvaginal first trimester obstetric pelvic duplex ultrasound was performed with real-time imaging. COMPARISON: Cerebral body CT 08/13/2021, Pelvis ultrasound 12/27/2021. CLINICAL HISTORY: 34 year old female with 4 to 5 days of abdominal pain in the first trimester of pregnancy. Quantitative human chorionic gonadotropin  (hCG) measured at 866. Estimated gestational age by last menstrual period (LMP) 4 weeks 0 days. FINDINGS: UTERUS: No intrauterine gestational sac identified. Endometrium appears bland on series 1 image 18. No focal myometrial mass. GESTATIONAL SAC(S): No intrauterine gestational sac identified. RIGHT OVARY: Measures 3.0 x 1.9 x 3.2 cm. Estimated volume is 10 mL. There is a right adnexal associated complex appearing area measuring about 2 cm (series 1 images 41 through 44) which is not obviously hypervascular. LEFT OVARY: Measures 2.0 x 3.2 x 2.3 cm. Estimated volume is 8 mL. Unremarkable. FREE FLUID: Trace simple appearing free fluid in the cul de sac. IMPRESSION: 1. Absent IUP and 2 cm complex area associated with the right adnexa. Right side ectopic pregnancy is not excluded and should be  considered. Other differential considerations include early IUP and failed IUP. 2. Recommend OBGYN consultation. Electronically signed by: Helayne Hurst MD MD 10/21/2024 06:50 AM EST RP Workstation: [id number]    MAU Course  Procedures  Lab Orders         Lipase, blood         Comprehensive metabolic panel         CBC         hCG, serum, qualitative         hCG, quantitative, pregnancy         Urinalysis, Routine w reflex microscopic -Urine, Clean Catch    Meds ordered this encounter  Medications   ondansetron  (ZOFRAN -ODT) disintegrating tablet 8 mg   methotrexate  (for ectopic pregnancy) 25 mg/mL chemo injection    Methotrexate  for ectopic pregnancy administration and follow up after administration must take place at [facility] or [facility].  Has the patient been instructed to travel to one of these locations or has a transfer been facilitated:   Yes    Imaging Orders         US  OB LESS THAN 14 WEEKS WITH OB TRANSVAGINAL     MDM Moderate (Level 3-4)  Assessment and Plan  Right tubal pregnancy without intrauterine pregnancy  -CBC/CMP/HCG drawn at  [facility] ED -HCG 866 -CBC/CMP unremarkable -US  in MAU shows absent IUP and 2cm complex area associated with right adnexa with right sided ectopic not excluded.  -Discussed case with Dr. Jayne who confirmed ectopic pregnancy -Will administer methotrexate .   The risks of methotrexate  were reviewed including failure requiring repeat dosing or eventual surgery. She understands that methotrexate  involves frequent return visits to monitor lab values and that she remains at risk of ectopic rupture until her beta is less than assay. ?The patient opts to proceed with methotrexate .  She has no history of hepatic or renal dysfunction, has normal BUN/Cr/LFT's/platelets.  She is felt to be reliable for follow-up. Side effects of photosensitivity & GI upset were discussed.  She knows to avoid direct sunlight and abstain from alcohol, NSAIDs  and sexual intercourse for two weeks. She was counseled to discontinue any MVI with folic acid . ?She understands to follow up on D4 (10/24/24) and D7 (10/27/24) for repeat BHCG and was given the instruction sheet. ?Strict ectopic precautions were reviewed, the patient knows to call with any abdominal pain, vomiting, fainting, or any concerns with her health.    Xana Bradt L Rindy Kollman, MD/MHA 10/21/2024 7:20 AM  Patient signed out to Steffan Rover, MD at change of shift.  Update: Notified by nursing staff that patient was waiting for methotrexate  to arrive to the unit but that she had to take her child to school so left without receiving methotrexate .  Reports she will return later today.      [1]  Allergies Allergen Reactions   Iodine Shortness Of Breath and Itching    Contrast in IV  [2]  No current facility-administered medications on file prior to encounter.   Current Outpatient Medications on File Prior to Encounter  Medication Sig Dispense Refill   Prenatal Vit-Fe Fumarate-FA (PRENATAL VITAMIN) 27-0.8 MG TABS Take 1 capsule by mouth daily. 30 tablet 0   acetaminophen  (TYLENOL ) 500 MG tablet Take 1,000 mg by mouth every 6 (six) hours as needed. (Patient not taking: Reported on 07/15/2024)     benzonatate  (TESSALON ) 100 MG capsule Take 1 capsule (100 mg total) by mouth 3 (three) times daily as needed for cough. 30 capsule 0   cyclobenzaprine  (FLEXERIL ) 5 MG tablet Take 1 tablet (5 mg total) by mouth 3 (three) times daily as needed for muscle spasms. 30 tablet 1   Drospirenone  (SLYND ) 4 MG TABS Take 1 tablet (4 mg total) by mouth daily at 6 (six) AM. 84 tablet 3   furosemide  (LASIX ) 20 MG tablet Take 1 tablet (20 mg total) by mouth daily for 5 days. (Patient not taking: Reported on 04/21/2024) 7 tablet 0   ibuprofen  (ADVIL ) 600 MG tablet Take 1 tablet (600 mg total) by mouth every 6 (six) hours as needed for mild pain (pain score 1-3) or moderate pain (pain score 4-6). (Patient not taking:  Reported on 04/21/2024) 60 tablet 1   mefloquine  (LARIAM ) 250 MG tablet Take 1 tablet (250mg ) by mouth weekly starting 2 weeks prior to travel to mal[place] region and ending 4 weeks after leaving area. 10 tablet 0   senna-docusate (SENOKOT-S) 8.6-50 MG tablet Take 2 tablets by mouth at bedtime as needed for mild constipation or moderate constipation. 60 tablet 1   valACYclovir  (VALTREX ) 1000 MG tablet Take 1 tablet (1,000 mg total) by mouth daily for 5 days. 5 tablet 6   "

## 2024-10-21 NOTE — Discharge Instructions (Signed)
 The risks of methotrexate  were reviewed including failure requiring repeat dosing or eventual surgery. You expressed understanding that methotrexate  involves frequent return visits to monitor lab values and that you remain at risk of ectopic rupture until your pregnancy hormone level returns to normal. You have opted to proceed with methotrexate .  We discussed side effects of photosensitivity & GI upset.  You should avoid direct sunlight and abstain from alcohol, NSAIDs and sexual intercourse for two weeks. If you are taking any multivitamins with folic acid  you should discontinue them.  ?You should follow up on day 4 (10/24/24) and day 7 (10/27/24) for repeat pregnancy hormone level checks at [facility] MAU.  ?Call or return to the MAU with any abdominal pain, vomiting, fainting, or fever.

## 2024-10-21 NOTE — MAU Note (Signed)
 Maria Morgan is a 34 y.o. at [redacted]w[redacted]d here in MAU reporting: here from [facility] - started [facility] in October but had excessive bleeding so she stopped it in November. Was in weight loss program, which tests for pregnancy each visit - had positive pregnancy test on 1/2. Reports spotting for 3 days, but it stopped today. Having left sided abdominal pain and pressure in lower abdomen. States she hasn't been able to confirm pregnancy in her OB office, so that's why she came in to make sure everything is ok   LMP: 09/23/2024 Pain score: 6 - left side; 6 - lower abdomen Vitals:   10/21/24 0507 10/21/24 0606  BP: (!) 145/73 (!) 145/82  Pulse: 88 90  Resp: 20 17  Temp: 97.6 F (36.4 C) 98.3 F (36.8 C)  SpO2: 100% 98%     FHT: NA  Lab orders placed from triage: none

## 2024-10-25 ENCOUNTER — Ambulatory Visit: Payer: Self-pay | Admitting: Student

## 2024-10-25 ENCOUNTER — Other Ambulatory Visit: Payer: Self-pay

## 2024-10-25 ENCOUNTER — Other Ambulatory Visit (HOSPITAL_BASED_OUTPATIENT_CLINIC_OR_DEPARTMENT_OTHER): Payer: Self-pay

## 2024-10-25 ENCOUNTER — Inpatient Hospital Stay (HOSPITAL_COMMUNITY)
Admission: AD | Admit: 2024-10-25 | Discharge: 2024-10-25 | Disposition: A | Discharge status: Home / Self Care | Attending: Obstetrics and Gynecology | Admitting: Obstetrics and Gynecology

## 2024-10-25 DIAGNOSIS — O00101 Right tubal pregnancy without intrauterine pregnancy: Secondary | ICD-10-CM

## 2024-10-25 DIAGNOSIS — Z3A01 Less than 8 weeks gestation of pregnancy: Secondary | ICD-10-CM

## 2024-10-25 LAB — HCG, QUANTITATIVE, PREGNANCY: hCG, Beta Chain, Quant, S: 1077 m[IU]/mL — ABNORMAL HIGH

## 2024-10-25 NOTE — MAU Provider Note (Signed)
 History   Chief Complaint:  HCG Level   Maria Morgan is a 34 y.o. G4P3003 at [redacted]w[redacted]d who presents for labs. She is day 4 s/p methotrexate  for ectopic pregnancy. Reports mild abdominal cramping. Denies vaginal bleeding.   Physical Exam   Blood pressure (!) 141/86, pulse 84, temperature 98.4 F (36.9 C), temperature source Oral, resp. rate 18, height 5' 8 (1.727 m), weight (!) 144.6 kg, last menstrual period 09/23/2024, SpO2 100%, currently breastfeeding.  Physical Examination: General appearance - alert, well appearing, and in no distress Mental status - normal mood, behavior, speech, dress, motor activity, and thought processes Eyes - pupils equal and reactive, extraocular eye movements intact, sclera anicteric Chest - normal respiratory effort  Labs: Results for orders placed or performed during the hospital encounter of 10/25/24 (from the past 24 hours)  hCG, quantitative, pregnancy   Collection Time: 10/25/24  9:15 AM  Result Value Ref Range   hCG, Beta Chain, Quant, S 1,077 (H) <5 mIU/mL    Ultrasound Studies:   No results found.  Assessment:   1. Right tubal pregnancy without intrauterine pregnancy   2. [redacted] weeks gestation of pregnancy       Plan: -Discharge home in stable condition -Ectopic precautions discussed -Scheduled for day 7 HCG at [facility] on Thursday -Patient may return to MAU as needed or if her condition were to change or worsen  Rocky Satterfield, NP 10/25/2024, 4:22 PM

## 2024-10-25 NOTE — MAU Note (Signed)
 Maria Morgan is a 34 y.o. at [redacted]w[redacted]d here in MAU reporting: she is here for repeat blood work.  Denies VB and pain.  LMP: 09/23/2024 Onset of complaint: ongoing Pain score: 0 Vitals:   10/25/24 0857  BP: (!) 141/86  Pulse: 84  Resp: 18  Temp: 98.4 F (36.9 C)  SpO2: 100%     FHT: NA  Lab orders placed from triage: HCG

## 2024-10-28 ENCOUNTER — Ambulatory Visit: Payer: Self-pay | Admitting: Family Medicine

## 2024-10-28 ENCOUNTER — Ambulatory Visit (INDEPENDENT_AMBULATORY_CARE_PROVIDER_SITE_OTHER)

## 2024-10-28 VITALS — BP 138/79 | HR 82 | Ht 68.0 in | Wt 318.0 lb

## 2024-10-28 DIAGNOSIS — O00101 Right tubal pregnancy without intrauterine pregnancy: Secondary | ICD-10-CM

## 2024-10-28 LAB — BETA HCG QUANT (REF LAB): hCG Quant: 968 m[IU]/mL

## 2024-10-28 NOTE — Progress Notes (Signed)
 Patient here for stat hcg, s/p methotrexate  7 days ago.  Continues to have some pressure on L side.  Denies pain.  No bleeding.  Erminio DELENA Rumps, RN

## 2024-11-04 ENCOUNTER — Other Ambulatory Visit

## 2024-11-04 ENCOUNTER — Other Ambulatory Visit: Payer: Self-pay | Admitting: Medical Genetics

## 2024-11-04 DIAGNOSIS — Z006 Encounter for examination for normal comparison and control in clinical research program: Secondary | ICD-10-CM

## 2024-11-04 DIAGNOSIS — O00101 Right tubal pregnancy without intrauterine pregnancy: Secondary | ICD-10-CM

## 2024-11-04 NOTE — Progress Notes (Signed)
Patient presents for repeat HCG.Patient was sent to the lab to have lab drawn. Charmain Diosdado l Peg Fifer, CMA

## 2024-11-05 ENCOUNTER — Ambulatory Visit: Payer: Self-pay | Admitting: Family Medicine

## 2024-11-05 LAB — BETA HCG QUANT (REF LAB): hCG Quant: 534 m[IU]/mL

## 2024-11-12 ENCOUNTER — Other Ambulatory Visit

## 2024-11-16 ENCOUNTER — Ambulatory Visit

## 2024-11-16 ENCOUNTER — Other Ambulatory Visit

## 2024-11-16 DIAGNOSIS — O00101 Right tubal pregnancy without intrauterine pregnancy: Secondary | ICD-10-CM

## 2024-11-17 ENCOUNTER — Other Ambulatory Visit

## 2024-11-17 ENCOUNTER — Ambulatory Visit: Payer: Self-pay

## 2024-11-17 LAB — BETA HCG QUANT (REF LAB): hCG Quant: 163 m[IU]/mL

## 2024-11-18 NOTE — Telephone Encounter (Signed)
 Spoke with patient and scheduled her appointment for next week.

## 2024-11-18 NOTE — Telephone Encounter (Signed)
-----   Message from Harlene LITTIE Duncans sent at 11/17/2024 12:20 PM EST ----- Needs repeat bHCG in one week. Thanks

## 2024-11-25 ENCOUNTER — Other Ambulatory Visit
